# Patient Record
Sex: Female | Born: 1950
Health system: Southern US, Community
[De-identification: ages and names within clinical notes are randomized; demographics above are authoritative.]

## PROBLEM LIST (undated history)

## (undated) DIAGNOSIS — E785 Hyperlipidemia, unspecified: Secondary | ICD-10-CM

## (undated) DIAGNOSIS — M1990 Unspecified osteoarthritis, unspecified site: Secondary | ICD-10-CM

## (undated) DIAGNOSIS — H269 Unspecified cataract: Secondary | ICD-10-CM

## (undated) HISTORY — DX: Unspecified osteoarthritis, unspecified site: M19.90

## (undated) HISTORY — DX: Unspecified cataract: H26.9

---
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
## 2008-01-06 ENCOUNTER — Ambulatory Visit: Payer: Self-pay

## 2016-04-22 DIAGNOSIS — S92002A Unspecified fracture of left calcaneus, initial encounter for closed fracture: Secondary | ICD-10-CM | POA: Diagnosis not present

## 2016-04-22 DIAGNOSIS — S92152A Displaced avulsion fracture (chip fracture) of left talus, initial encounter for closed fracture: Secondary | ICD-10-CM | POA: Diagnosis not present

## 2016-04-22 DIAGNOSIS — S99912A Unspecified injury of left ankle, initial encounter: Secondary | ICD-10-CM | POA: Diagnosis not present

## 2016-04-22 DIAGNOSIS — W19XXXA Unspecified fall, initial encounter: Secondary | ICD-10-CM | POA: Diagnosis not present

## 2016-04-23 DIAGNOSIS — M79672 Pain in left foot: Secondary | ICD-10-CM | POA: Diagnosis not present

## 2016-04-23 DIAGNOSIS — S92025A Nondisplaced fracture of anterior process of left calcaneus, initial encounter for closed fracture: Secondary | ICD-10-CM | POA: Diagnosis not present

## 2016-05-09 DIAGNOSIS — M79672 Pain in left foot: Secondary | ICD-10-CM | POA: Diagnosis not present

## 2016-05-09 DIAGNOSIS — S92022D Displaced fracture of anterior process of left calcaneus, subsequent encounter for fracture with routine healing: Secondary | ICD-10-CM | POA: Diagnosis not present

## 2016-06-04 DIAGNOSIS — S92022D Displaced fracture of anterior process of left calcaneus, subsequent encounter for fracture with routine healing: Secondary | ICD-10-CM | POA: Diagnosis not present

## 2016-07-04 DIAGNOSIS — S92022D Displaced fracture of anterior process of left calcaneus, subsequent encounter for fracture with routine healing: Secondary | ICD-10-CM | POA: Diagnosis not present

## 2016-08-14 DIAGNOSIS — M84375A Stress fracture, left foot, initial encounter for fracture: Secondary | ICD-10-CM | POA: Diagnosis not present

## 2016-08-14 DIAGNOSIS — S92022D Displaced fracture of anterior process of left calcaneus, subsequent encounter for fracture with routine healing: Secondary | ICD-10-CM | POA: Diagnosis not present

## 2016-09-05 DIAGNOSIS — M79672 Pain in left foot: Secondary | ICD-10-CM | POA: Diagnosis not present

## 2016-09-05 DIAGNOSIS — M659 Synovitis and tenosynovitis, unspecified: Secondary | ICD-10-CM | POA: Diagnosis not present

## 2017-05-05 DIAGNOSIS — Z6832 Body mass index (BMI) 32.0-32.9, adult: Secondary | ICD-10-CM | POA: Diagnosis not present

## 2017-05-05 DIAGNOSIS — Z7722 Contact with and (suspected) exposure to environmental tobacco smoke (acute) (chronic): Secondary | ICD-10-CM | POA: Diagnosis not present

## 2017-05-05 DIAGNOSIS — Z833 Family history of diabetes mellitus: Secondary | ICD-10-CM | POA: Diagnosis not present

## 2017-05-05 DIAGNOSIS — E669 Obesity, unspecified: Secondary | ICD-10-CM | POA: Diagnosis not present

## 2017-05-05 DIAGNOSIS — R69 Illness, unspecified: Secondary | ICD-10-CM | POA: Diagnosis not present

## 2017-05-05 DIAGNOSIS — Z8249 Family history of ischemic heart disease and other diseases of the circulatory system: Secondary | ICD-10-CM | POA: Diagnosis not present

## 2017-05-23 ENCOUNTER — Ambulatory Visit (INDEPENDENT_AMBULATORY_CARE_PROVIDER_SITE_OTHER): Payer: Medicare HMO | Admitting: Nurse Practitioner

## 2017-05-23 ENCOUNTER — Other Ambulatory Visit: Payer: Self-pay

## 2017-05-23 ENCOUNTER — Encounter: Payer: Self-pay | Admitting: Nurse Practitioner

## 2017-05-23 VITALS — BP 144/81 | HR 77 | Temp 97.5°F | Ht 61.5 in | Wt 181.0 lb

## 2017-05-23 DIAGNOSIS — G8929 Other chronic pain: Secondary | ICD-10-CM | POA: Diagnosis not present

## 2017-05-23 DIAGNOSIS — M25512 Pain in left shoulder: Secondary | ICD-10-CM | POA: Diagnosis not present

## 2017-05-23 DIAGNOSIS — Z7689 Persons encountering health services in other specified circumstances: Secondary | ICD-10-CM

## 2017-05-23 DIAGNOSIS — H269 Unspecified cataract: Secondary | ICD-10-CM | POA: Insufficient documentation

## 2017-05-23 NOTE — Patient Instructions (Addendum)
Abigail Delacruz,   Thank you for coming in to clinic today.  You have left arm strain, possibly of your rotator cuff.  - May take Aleeve or naproxen sodium 220 mg every 12 hours for 14 days, then as needed. May alternate Aleeve and Tylenol in same day. - Start taking Tylenol extra strength 1 to 2 tablets every 6-8 hours for aches or fever/chills for next few days as needed.  Do not take more than 3,000 mg in 24 hours from all medicines.  - Use heat and ice.  Apply this for 15 minutes at a time 6-8 times per day.   - Muscle rub with lidocaine, lidocaine patch, Biofreeze, or tiger balm for topical pain relief.  Avoid using this with heat and ice to avoid burns.  START [name] for increasing range of motion.  Ophthalmologist for cataract surgery: [facility] [address], [place], [place] [address] Phone: [phone] [email or web address]   Please schedule a follow-up appointment with [name], AGNP. Return in about 3 months (around 08/23/2017) for annual physical.  AND let me know if you have need for followup  If you have any other questions or concerns, please feel free to call the clinic or send a message through [place]. You may also schedule an earlier appointment if necessary.  You will receive a survey after today's visit either digitally by e-mail or paper by [employer]. Your experiences and feedback matter to [place].  Please respond so we know how we are doing as we provide care for you.   [name], DNP, AGNP-BC Adult Gerontology Nurse Practitioner [place]

## 2017-05-23 NOTE — Progress Notes (Signed)
Subjective:    Patient ID: Abigail Delacruz, female    DOB: 12-Aug-1950, 67 y.o.   MRN: [id number]  Abigail Delacruz is a 67 y.o. female presenting on 05/23/2017 for Establish Care (intermittent Left arm pain,x 12 mths.Pt describe the pain as a burning pain w/ intermittent popping sounds.  possible associated to a fall x 1 yr ago )   HPI Establish Care New Provider Pt last seen by PCP many years ago ([place] at least 10 years ago).  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  She is originally from [place].  Left arm pain End of February fell and fractured left foot.  Had crutches for 7-8 weeks.  Foot continues to heal and improve, but occasionally has pain.  No known injury of shoulder during that time.  First noticed her shoulder was hurting about 5 months ago, but is getting worse.  Is "making noises in her shoulder."  Is difficult to put on her bra and lift her arm. - No OTC meds for pain.  Past Medical History:  Diagnosis Date  . Cataract    Past Surgical History:  Procedure Laterality Date  . APPENDECTOMY       Social History   Socioeconomic History  . Marital status: Married    Spouse name: Not on file  . Number of children: 2  . Years of education: Not on file  . Highest education level: Some college, no degree  Occupational History  . Not on file  Social Needs  . Financial resource strain: Not on file  . Food insecurity:    Worry: Not on file    Inability: Not on file  . Transportation needs:    Medical: Not on file    Non-medical: Not on file  Tobacco Use  . Smoking status: Former Research scientist (life sciences)  . Smokeless tobacco: Never Used  Substance and Sexual Activity  . Alcohol use: Not Currently    Frequency: Never    Comment: social 1x/yr  . Drug use: Never  . Sexual activity: Not Currently  Lifestyle  . Physical activity:    Days per week: Not on file    Minutes per session: Not on file    . Stress: Not on file  Relationships  . Social connections:    Talks on phone: Not on file    Gets together: Not on file    Attends religious service: Not on file    Active member of club or organization: Not on file    Attends meetings of clubs or organizations: Not on file    Relationship status: Not on file  . Intimate partner violence:    Fear of current or ex partner: Not on file    Emotionally abused: Not on file    Physically abused: Not on file    Forced sexual activity: Not on file  Other Topics Concern  . Not on file  Social History Narrative  . Not on file   Family History  Problem Relation Age of Onset  . Kidney failure Mother   . Cirrhosis Father   . Alcohol abuse Father   . Diabetes Brother   . Hypertension Brother   . Arthritis Brother    No current outpatient medications on file prior to visit.   No current facility-administered medications on file prior to visit.     Review of Systems  Constitutional: Negative.   HENT: Negative.  Eyes: Positive for visual disturbance (cataract).  Respiratory: Negative.   Cardiovascular: Negative.   Gastrointestinal: Negative.   Endocrine: Negative.   Genitourinary: Negative.   Musculoskeletal: Positive for arthralgias and myalgias.  Skin: Negative.   Allergic/Immunologic: Negative.   Neurological: Negative.   Hematological: Negative.   Psychiatric/Behavioral: Negative.    Per HPI unless specifically indicated above      Objective:    BP (!) 144/81 (BP Location: Left Arm, Patient Position: Sitting, Cuff Size: Normal)   Pulse 77   Temp (!) 97.5 F (36.4 C) (Oral)   Ht 5' 1.5" (1.562 m)   Wt 181 lb (82.1 kg)   BMI 33.65 kg/m   Wt Readings from Last 3 Encounters:  05/23/17 181 lb (82.1 kg)    Physical Exam  Constitutional: She is oriented to person, place, and time. She appears well-developed and well-nourished. No distress.  HENT:  Head: Normocephalic and atraumatic.  Neck: Normal range of motion.  Neck supple.  Cardiovascular: Normal rate, regular rhythm, S1 normal, S2 normal, normal heart sounds and intact distal pulses.  Pulmonary/Chest: Effort normal and breath sounds normal. No respiratory distress.  Musculoskeletal: She exhibits no edema (pedal).  Left Shoulder Inspection: Normal appearance bilateral symmetrical Palpation: Non-tender to palpation over anterior, lateral, or posterior shoulder  ROM: Reduced AROM and PROM forward flexion, abduction, internal / external rotation, symmetrical with pain in deltoid with full limits of ROM Special Testing: Rotator cuff testing positive for weakness with supraspinatus empty can test, O'brien's negative for labral pain, Hawkin's AC impingement negative for pain Strength: Reduced strength 4/5 flex/ext, ext rot / int rot, grip, rotator cuff str testing. Neurovascular: Distally intact pulses, sensation to light touch   Neurological: She is alert and oriented to person, place, and time.  Skin: Skin is warm and dry.  Psychiatric: She has a normal mood and affect. Her behavior is normal.  Vitals reviewed.     Assessment & Plan:   Problem List Items Addressed This Visit      Other   Chronic left shoulder pain - Primary Consistent with chronic rotator cuff tendonopathy vs impingement syndrome with some reduced active ROM and with evidence of possible muscle tear given weakness.  No known repetitive overhead/strenuous activity as likely etiology and no clear etiology of injury.  67 year old patient with likely underlying arthritis -No prior imaging / No imaging on chart  Plan: 1. Start rx Naproxen 220 mg twice daily (with food) for 2 weeks, then as needed 2. May take Tylenol Ex Str 1-2 q 6 hr PRN 3. Relative rest but keep shoulder mobile, demonstrated ROM exercises, avoid heavy lifting 4. May try heating pad PRN 5.  Given chronicity of injury, will go ahead and proceed with orthopedic surgery referral. 6.  Referral to [place] physical  therapy provided for patient today.  Work to increase range of motion and control pain. 7. Follow-up 4-6 weeks if not improved for re-evaluation   Relevant Orders   Ambulatory referral to Orthopedic Surgery    Other Visit Diagnoses    Encounter to establish care     Previous PCP was at more than 10 years ago.  Records will not be requested.  Past medical, family, and surgical history reviewed w/ pt in clinic today.      Follow up plan: Return in about 3 months (around 08/23/2017) for annual physical.  [name], DNP, AGPCNP-BC Adult Gerontology Primary Care Nurse Practitioner [facility] 05/23/2017, 9:10 AM

## 2017-05-27 DIAGNOSIS — M7542 Impingement syndrome of left shoulder: Secondary | ICD-10-CM | POA: Diagnosis not present

## 2017-05-27 DIAGNOSIS — M7502 Adhesive capsulitis of left shoulder: Secondary | ICD-10-CM | POA: Diagnosis not present

## 2017-05-29 DIAGNOSIS — M25572 Pain in left ankle and joints of left foot: Secondary | ICD-10-CM | POA: Diagnosis not present

## 2017-05-29 DIAGNOSIS — M7542 Impingement syndrome of left shoulder: Secondary | ICD-10-CM | POA: Diagnosis not present

## 2017-05-29 DIAGNOSIS — M7502 Adhesive capsulitis of left shoulder: Secondary | ICD-10-CM | POA: Diagnosis not present

## 2017-06-03 DIAGNOSIS — M7502 Adhesive capsulitis of left shoulder: Secondary | ICD-10-CM | POA: Diagnosis not present

## 2017-06-03 DIAGNOSIS — M7542 Impingement syndrome of left shoulder: Secondary | ICD-10-CM | POA: Diagnosis not present

## 2017-06-05 DIAGNOSIS — M7502 Adhesive capsulitis of left shoulder: Secondary | ICD-10-CM | POA: Diagnosis not present

## 2017-06-05 DIAGNOSIS — M7542 Impingement syndrome of left shoulder: Secondary | ICD-10-CM | POA: Diagnosis not present

## 2017-06-10 DIAGNOSIS — M7502 Adhesive capsulitis of left shoulder: Secondary | ICD-10-CM | POA: Diagnosis not present

## 2017-06-10 DIAGNOSIS — M7542 Impingement syndrome of left shoulder: Secondary | ICD-10-CM | POA: Diagnosis not present

## 2017-06-12 DIAGNOSIS — M7542 Impingement syndrome of left shoulder: Secondary | ICD-10-CM | POA: Diagnosis not present

## 2017-06-12 DIAGNOSIS — M7502 Adhesive capsulitis of left shoulder: Secondary | ICD-10-CM | POA: Diagnosis not present

## 2017-06-17 DIAGNOSIS — M7542 Impingement syndrome of left shoulder: Secondary | ICD-10-CM | POA: Diagnosis not present

## 2017-06-17 DIAGNOSIS — M7502 Adhesive capsulitis of left shoulder: Secondary | ICD-10-CM | POA: Diagnosis not present

## 2017-06-19 DIAGNOSIS — M7502 Adhesive capsulitis of left shoulder: Secondary | ICD-10-CM | POA: Diagnosis not present

## 2017-06-19 DIAGNOSIS — M7542 Impingement syndrome of left shoulder: Secondary | ICD-10-CM | POA: Diagnosis not present

## 2017-06-24 DIAGNOSIS — M7542 Impingement syndrome of left shoulder: Secondary | ICD-10-CM | POA: Diagnosis not present

## 2017-06-24 DIAGNOSIS — M7502 Adhesive capsulitis of left shoulder: Secondary | ICD-10-CM | POA: Diagnosis not present

## 2017-06-26 DIAGNOSIS — M7502 Adhesive capsulitis of left shoulder: Secondary | ICD-10-CM | POA: Diagnosis not present

## 2017-06-26 DIAGNOSIS — M7542 Impingement syndrome of left shoulder: Secondary | ICD-10-CM | POA: Diagnosis not present

## 2017-06-30 DIAGNOSIS — M7542 Impingement syndrome of left shoulder: Secondary | ICD-10-CM | POA: Diagnosis not present

## 2017-06-30 DIAGNOSIS — M7502 Adhesive capsulitis of left shoulder: Secondary | ICD-10-CM | POA: Diagnosis not present

## 2017-07-01 DIAGNOSIS — M7542 Impingement syndrome of left shoulder: Secondary | ICD-10-CM | POA: Diagnosis not present

## 2017-07-01 DIAGNOSIS — M7502 Adhesive capsulitis of left shoulder: Secondary | ICD-10-CM | POA: Diagnosis not present

## 2017-07-11 DIAGNOSIS — M7502 Adhesive capsulitis of left shoulder: Secondary | ICD-10-CM | POA: Diagnosis not present

## 2017-07-11 DIAGNOSIS — M7542 Impingement syndrome of left shoulder: Secondary | ICD-10-CM | POA: Diagnosis not present

## 2017-08-01 ENCOUNTER — Telehealth: Payer: Self-pay | Admitting: Nurse Practitioner

## 2017-08-01 NOTE — Telephone Encounter (Signed)
Called to schedule MWV w [facility] prior to physical scheduled for 08-26-17.  Could change labs to a Tuesday and do AWV at that time.

## 2017-08-12 DIAGNOSIS — H2513 Age-related nuclear cataract, bilateral: Secondary | ICD-10-CM | POA: Diagnosis not present

## 2017-08-20 ENCOUNTER — Other Ambulatory Visit: Payer: Self-pay

## 2017-08-21 ENCOUNTER — Other Ambulatory Visit: Payer: Medicare HMO

## 2017-08-21 ENCOUNTER — Other Ambulatory Visit: Payer: Self-pay | Admitting: Nurse Practitioner

## 2017-08-21 DIAGNOSIS — R7989 Other specified abnormal findings of blood chemistry: Secondary | ICD-10-CM | POA: Diagnosis not present

## 2017-08-21 DIAGNOSIS — Z13228 Encounter for screening for other metabolic disorders: Secondary | ICD-10-CM | POA: Diagnosis not present

## 2017-08-21 DIAGNOSIS — Z130 Encounter for screening for diseases of the blood and blood-forming organs and certain disorders involving the immune mechanism: Secondary | ICD-10-CM

## 2017-08-21 DIAGNOSIS — Z1329 Encounter for screening for other suspected endocrine disorder: Secondary | ICD-10-CM

## 2017-08-21 DIAGNOSIS — E782 Mixed hyperlipidemia: Secondary | ICD-10-CM | POA: Diagnosis not present

## 2017-08-22 LAB — CBC WITH DIFFERENTIAL/PLATELET
Basophils Absolute: 51 cells/uL (ref 0–200)
Basophils Relative: 0.9 %
Eosinophils Absolute: 143 cells/uL (ref 15–500)
Eosinophils Relative: 2.5 %
HCT: 42.5 % (ref 35.0–45.0)
Hemoglobin: 14 g/dL (ref 11.7–15.5)
Lymphs Abs: 1505 cells/uL (ref 850–3900)
MCH: 30.7 pg (ref 27.0–33.0)
MCHC: 32.9 g/dL (ref 32.0–36.0)
MCV: 93.2 fL (ref 80.0–100.0)
MPV: 11.4 fL (ref 7.5–12.5)
Monocytes Relative: 7.9 %
Neutro Abs: 3551 cells/uL (ref 1500–7800)
Neutrophils Relative %: 62.3 %
Platelets: 245 10*3/uL (ref 140–400)
RBC: 4.56 10*6/uL (ref 3.80–5.10)
RDW: 13.1 % (ref 11.0–15.0)
Total Lymphocyte: 26.4 %
WBC mixed population: 450 cells/uL (ref 200–950)
WBC: 5.7 10*3/uL (ref 3.8–10.8)

## 2017-08-22 LAB — COMPLETE METABOLIC PANEL WITH GFR
AG Ratio: 1.5 (calc) (ref 1.0–2.5)
ALT: 29 U/L (ref 6–29)
AST: 23 U/L (ref 10–35)
Albumin: 4 g/dL (ref 3.6–5.1)
Alkaline phosphatase (APISO): 140 U/L — ABNORMAL HIGH (ref 33–130)
BUN: 21 mg/dL (ref 7–25)
CO2: 25 mmol/L (ref 20–32)
Calcium: 9 mg/dL (ref 8.6–10.4)
Chloride: 103 mmol/L (ref 98–110)
Creat: 0.74 mg/dL (ref 0.50–0.99)
GFR, Est African American: 97 mL/min/{1.73_m2} (ref >=60)
GFR, Est Non African American: 84 mL/min/{1.73_m2} (ref >=60)
Globulin: 2.7 g/dL (calc) (ref 1.9–3.7)
Glucose, Bld: 99 mg/dL (ref 65–99)
Potassium: 4.3 mmol/L (ref 3.5–5.3)
Sodium: 138 mmol/L (ref 135–146)
Total Bilirubin: 0.5 mg/dL (ref 0.2–1.2)
Total Protein: 6.7 g/dL (ref 6.1–8.1)

## 2017-08-22 LAB — LIPID PANEL
Cholesterol: 258 mg/dL — ABNORMAL HIGH (ref <200)
HDL: 43 mg/dL — ABNORMAL LOW (ref >50)
LDL Cholesterol (Calc): 172 mg/dL (calc) — ABNORMAL HIGH
Non-HDL Cholesterol (Calc): 215 mg/dL (calc) — ABNORMAL HIGH (ref <130)
Total CHOL/HDL Ratio: 6 (calc) — ABNORMAL HIGH (ref <5.0)
Triglycerides: 265 mg/dL — ABNORMAL HIGH (ref <150)

## 2017-08-22 LAB — HEMOGLOBIN A1C
Hgb A1c MFr Bld: 5.6 % of total Hgb (ref <5.7)
Mean Plasma Glucose: 114 (calc)
eAG (mmol/L): 6.3 (calc)

## 2017-08-22 LAB — TSH: TSH: 1.93 mIU/L (ref 0.40–4.50)

## 2017-08-26 ENCOUNTER — Ambulatory Visit (INDEPENDENT_AMBULATORY_CARE_PROVIDER_SITE_OTHER): Payer: Medicare HMO | Admitting: Nurse Practitioner

## 2017-08-26 ENCOUNTER — Other Ambulatory Visit: Payer: Self-pay

## 2017-08-26 ENCOUNTER — Encounter: Payer: Self-pay | Admitting: Nurse Practitioner

## 2017-08-26 VITALS — BP 136/60 | HR 81 | Resp 16 | Ht 61.5 in | Wt 179.2 lb

## 2017-08-26 DIAGNOSIS — Z780 Asymptomatic menopausal state: Secondary | ICD-10-CM | POA: Diagnosis not present

## 2017-08-26 DIAGNOSIS — Z0001 Encounter for general adult medical examination with abnormal findings: Secondary | ICD-10-CM | POA: Diagnosis not present

## 2017-08-26 DIAGNOSIS — E782 Mixed hyperlipidemia: Secondary | ICD-10-CM | POA: Diagnosis not present

## 2017-08-26 DIAGNOSIS — Z23 Encounter for immunization: Secondary | ICD-10-CM | POA: Diagnosis not present

## 2017-08-26 DIAGNOSIS — D2261 Melanocytic nevi of right upper limb, including shoulder: Secondary | ICD-10-CM

## 2017-08-26 DIAGNOSIS — Z1211 Encounter for screening for malignant neoplasm of colon: Secondary | ICD-10-CM | POA: Diagnosis not present

## 2017-08-26 DIAGNOSIS — Z1239 Encounter for other screening for malignant neoplasm of breast: Secondary | ICD-10-CM

## 2017-08-26 DIAGNOSIS — Z1231 Encounter for screening mammogram for malignant neoplasm of breast: Secondary | ICD-10-CM

## 2017-08-26 MED ORDER — ATORVASTATIN CALCIUM 20 MG PO TABS: 20.0000 mg | ORAL_TABLET | Freq: Every day | ORAL | 3 refills | Status: DC

## 2017-08-26 NOTE — Progress Notes (Signed)
Subjective:    Patient ID: Abigail Delacruz, female    DOB: 10-06-1950, 67 y.o.   MRN: [id number]  Abigail Delacruz is a 67 y.o. female presenting on 08/26/2017 for Annual Exam   HPI Annual Physical Exam Patient has been feeling well.  Shoulder pain and ROM are improving.  They have no acute concerns today. Sleeps 7-8 hours per night uninterrupted. Cataract surgery 09/29/2017 R eye and 11/03/2017 (L eye) ([facility])  HEALTH MAINTENANCE: Weight/BMI: Obesity class 1 Physical activity: regular walking with travel, no "fun" place to walk Diet: Regular Seatbelt: always Sunscreen: regularly Mammogram: due DEXA: due Colon Cancer Screen: due HIV/Hep C: hiv neg, hep c defer to next year Optometry: regular - cataracts Dentistry: no regular care, requests cleaning  VACCINES: Tetanus: due Pneumonia: Prevnar 13 today   Social History   Tobacco Use  . Smoking status: Former Research scientist (life sciences)  . Smokeless tobacco: Never Used  Substance Use Topics  . Alcohol use: Not Currently    Frequency: Never    Comment: social 1x/yr  . Drug use: Never    Review of Systems Per HPI unless specifically indicated above     Objective:    BP 136/60   Pulse 81   Resp 16   Ht 5' 1.5" (1.562 m)   Wt 179 lb 3.2 oz (81.3 kg)   SpO2 98%   BMI 33.31 kg/m   Wt Readings from Last 3 Encounters:  08/26/17 179 lb 3.2 oz (81.3 kg)  05/23/17 181 lb (82.1 kg)    Physical Exam  Constitutional: She is oriented to person, place, and time. She appears well-developed and well-nourished. No distress.  HENT:  Head: Normocephalic and atraumatic.  Right Ear: External ear normal.  Left Ear: External ear normal.  Nose: Nose normal.  Mouth/Throat: Oropharynx is clear and moist.  Eyes: Pupils are equal, round, and reactive to light. Conjunctivae are normal.  Neck: Normal range of motion. Neck supple. No JVD present. No tracheal deviation present. No thyromegaly present.  Cardiovascular: Normal rate, regular  rhythm, normal heart sounds and intact distal pulses. Exam reveals no gallop and no friction rub.  No murmur heard. Pulmonary/Chest: Effort normal and breath sounds normal. No respiratory distress.  Abdominal: Soft. Bowel sounds are normal. She exhibits no distension. There is no hepatosplenomegaly. There is no tenderness.  Musculoskeletal: Normal range of motion.  Full Left shoulder ROM without pain  Lymphadenopathy:    She has no cervical adenopathy.  Neurological: She is alert and oriented to person, place, and time. No cranial nerve deficit.  Skin: Skin is warm and dry. Capillary refill takes less than 2 seconds.  Psychiatric: She has a normal mood and affect. Her behavior is normal. Judgment and thought content normal.  Nursing note and vitals reviewed.    Results for orders placed or performed in visit on 08/21/17  TSH  Result Value Ref Range   TSH 1.93 0.40 - 4.50 mIU/L  Lipid panel  Result Value Ref Range   Cholesterol 258 (H) <200 mg/dL   HDL 43 (L) >50 mg/dL   Triglycerides 265 (H) <150 mg/dL   LDL Cholesterol (Calc) 172 (H) mg/dL (calc)   Total CHOL/HDL Ratio 6.0 (H) <5.0 (calc)   Non-HDL Cholesterol (Calc) 215 (H) <130 mg/dL (calc)  Hemoglobin A1c  Result Value Ref Range   Hgb A1c MFr Bld 5.6 <5.7 % of total Hgb   Mean Plasma Glucose 114 (calc)   eAG (mmol/L) 6.3 (calc)  COMPLETE METABOLIC  PANEL WITH GFR  Result Value Ref Range   Glucose, Bld 99 65 - 99 mg/dL   BUN 21 7 - 25 mg/dL   Creat 0.74 0.50 - 0.99 mg/dL   GFR, Est Non African American 84 > OR = 60 mL/min/1.63m   GFR, Est African American 97 > OR = 60 mL/min/1.758m  BUN/Creatinine Ratio NOT APPLICABLE 6 - 22 (calc)   Sodium 138 135 - 146 mmol/L   Potassium 4.3 3.5 - 5.3 mmol/L   Chloride 103 98 - 110 mmol/L   CO2 25 20 - 32 mmol/L   Calcium 9.0 8.6 - 10.4 mg/dL   Total Protein 6.7 6.1 - 8.1 g/dL   Albumin 4.0 3.6 - 5.1 g/dL   Globulin 2.7 1.9 - 3.7 g/dL (calc)   AG Ratio 1.5 1.0 - 2.5 (calc)    Total Bilirubin 0.5 0.2 - 1.2 mg/dL   Alkaline phosphatase (APISO) 140 (H) 33 - 130 U/L   AST 23 10 - 35 U/L   ALT 29 6 - 29 U/L  CBC with Differential/Platelet  Result Value Ref Range   WBC 5.7 3.8 - 10.8 Thousand/uL   RBC 4.56 3.80 - 5.10 Million/uL   Hemoglobin 14.0 11.7 - 15.5 g/dL   HCT 42.5 35.0 - 45.0 %   MCV 93.2 80.0 - 100.0 fL   MCH 30.7 27.0 - 33.0 pg   MCHC 32.9 32.0 - 36.0 g/dL   RDW 13.1 11.0 - 15.0 %   Platelets 245 140 - 400 Thousand/uL   MPV 11.4 7.5 - 12.5 fL   Neutro Abs 3,551 1,500 - 7,800 cells/uL   Lymphs Abs 1,505 850 - 3,900 cells/uL   WBC mixed population 450 200 - 950 cells/uL   Eosinophils Absolute 143 15 - 500 cells/uL   Basophils Absolute 51 0 - 200 cells/uL   Neutrophils Relative % 62.3 %   Total Lymphocyte 26.4 %   Monocytes Relative 7.9 %   Eosinophils Relative 2.5 %   Basophils Relative 0.9 %      Assessment & Plan:   Problem List Items Addressed This Visit    None    Visit Diagnoses    Encounter for general adult medical examination with abnormal findings    -  Primary Physical exam with no new physical findings, but with abnormal lab findings with pre visit labs.  Well adult with no acute concerns.  Plan: 1. Obtain health maintenance screenings according to age. - Labs reviewed with patient in clinic - Increase physical activity to 30 minutes most days of the week.  - Eat healthy diet high in vegetables and fruits; low in refined carbohydrates. 2. Return 1 year for annual physical.    Mixed hyperlipidemia     New diagnosis hyperlipidemia with elevations of Total cholesterol, triglycerides, LDL and low HDL.  She is not currently participating in regular physical exercise or eating very healthy diet.  Patient also with obesity, worsening with age.  Plan: 1. Encouraged low glycemic diet 2. START atorvastatin 20 mg once daily 3. CMP, Lipid at followup 4. Follow-up 6 months approx.   Relevant Orders   COMPLETE METABOLIC PANEL WITH GFR    Lipid panel   Atypical nevus of forearm, right     Patient with "new" nevus on R posterior forearm with multiple colors and raised appearance.  No current change in skin integrity.  - Referral to dermatology for evaluation, full body skin check, skin cancer screen. - follow-up annually and prn  Relevant Orders   Ambulatory referral to Dermatology   Need for vaccination against Streptococcus pneumoniae using pneumococcal conjugate vaccine 13     Patient with need for PCV-13.  No prior pneumonia vaccines.  Administer today.  Vaccinate with PPSV23 in 1 year.   Relevant Orders   Pneumococcal conjugate vaccine 13-valent IM (Completed)   Colon cancer screening     Pt requiring colon cancer screening and no screening in past.  No family history of colon cancer.  Plan: - Discussed timing for initiation of colon cancer screening ACS vs USPSTF guidelines - Mutual decision making discussion for options of colonoscopy vs cologuard.  Pt prefers cologuard. - Ordered Cologuard today.  Kit demonstrated by Roselyn Reef, CMA.   Relevant Orders   Cologuard   Breast cancer screening     Pt last mammogram approx 3 years ago per pt.  Result neg.  Plan: 1. Screening mammogram order placed.  Pt will call to schedule appointment.  Information given for scheduling. 2. Follow-up 1 year    Relevant Orders   MM DIGITAL SCREENING BILATERAL   Asymptomatic postmenopausal estrogen deficiency     Pt postmenopausal w/o history of prior DEXA scan.    Plan: 1. Obtain DG bone density.  Patient to call and schedule.      Relevant Orders   DG Bone Density      Meds ordered this encounter  Medications  . atorvastatin (LIPITOR) 20 MG tablet    Sig: Take 1 tablet (20 mg total) by mouth daily.    Dispense:  90 tablet    Refill:  3    Order Specific Question:   Supervising Provider    Answer:   Olin Hauser [[id number]]    Follow up plan: Return in about 5 months (around 01/26/2018) for Cholesterol with fasting  labs prior.  [name], DNP, AGPCNP-BC Adult Gerontology Primary Care Nurse Practitioner [facility] 08/26/2017, 10:00 AM

## 2017-08-26 NOTE — Patient Instructions (Addendum)
Abigail Delacruz,   Thank you for coming in to clinic today.  1. Your provider recommends a dental exam and cleaning: [facility] Address: [address], [place], [place] [address]  Phone: [phone]  Ruthy Dick, DDS Address: [address] [place], [place] [address]  Phone: [phone]   2. Your mammogram and bone density orders have been placed.  Call the Scheduling phone number at [phone] to schedule your mammogram at your convenience.  You can choose to go to either location listed below.  Let the scheduler know which location you prefer.  [facility]  [place], [place] [address]   [facility] [address] [place], [place] [address]  3. Colon Cancer Screening: - For all adults age 88 and older, routine colon cancer screening is highly recommended. - Early detection of colon cancer is important, because often there are no warning signs or symptoms.  If colon cancer is found early, usually it can be cured. Advanced cancer is hard to treat.  - If you are not interested in Colonoscopy screening (if done and normal you could be cleared for 5 to 10 years until next due), then Cologuard is an excellent alternative for screening test for Colon Cancer. It is highly sensitive for detecting DNA of colon cancer from even the earliest stages. Also, there is NO bowel prep required. - If Cologuard is NEGATIVE, then it is good for 3 years before next due - If Cologuard is POSITIVE, then it is strongly advised to get a Colonoscopy, which allows the GI doctor to locate the source of the cancer or polyp (even very early stage) and treat it by removing it. ------------------------- If you would like to proceed with Cologuard (stool DNA test) - FIRST, call your insurance company and tell them you want to check cost of Cologuard tell them CPT Code [phone] (it may be  completely covered with a small or no cost, OR max cost without any coverage is about $600). If you do NOT open the kit, and decide not to do the test, you will NOT be charged, you should contact the company to return the kit if you decide not to do the test. - If you want to proceed, you can notify us (office phone, [place], or at next visit) and we will order it for you. The test kit will be delivered to your house in about 1 week. Follow instructions to collect your stool sample.  You may call the company for any help or questions, 24/7 telephone support at [phone].   - Increase your physical activity until you are increasing your heart rate for 30 minutes on most days of the week. - Eat a low glycemic diet (handout) to help improve your cholesterol.  Please schedule a follow-up appointment with [name], AGNP. Return in about 5 months (around 01/26/2018) for Cholesterol with fasting labs prior.  If you have any other questions or concerns, please feel free to call the clinic or send a message through [place]. You may also schedule an earlier appointment if necessary.  You will receive a survey after today's visit either digitally by e-mail or paper by [employer]. Your experiences and feedback matter to [place].  Please respond so we know how we are doing as we provide care for you.   [name], DNP, AGNP-BC Adult Gerontology Nurse Practitioner [place]  Serving Sizes A serving size is a measured amount of food or drink, such as one slice of bread, that has an associated nutrient content. Knowing the serving size of a food or drink can help you determine how much of that food you should consume. What is the size of one serving? The size of one healthy serving depends on the food or drink. To determine a serving size, read the food label. If the food or drink does not have a food label, try to find serving size information online. Or, use the  following to estimate the size of one adult serving: Grain 1 slice bread.  bagel.  cup pasta. Vegetable  cup cooked or canned vegetables. 1 cup raw, leafy greens. Fruit  cup canned fruit. 1 medium fruit.  cup dried fruit. Meat and Other Protein Sources 1 oz meat, poultry, or fish.  cup cooked beans. 1 egg.  cup nuts or seeds. 1 Tbsp nut butter.  cup tofu or tempeh. 2 Tbsp hummus. Dairy An individual container of yogurt (6-8 oz). 1 piece of cheese the size of your thumb (1 oz). 1 cup (8 oz) milk or milk alternative. Fat A piece the size of one dice. 1 tsp soft margarine. 1 Tbsp mayonnaise. 1 tsp vegetable oil. 1 Tbsp regular salad dressing. 2 Tbsp low-fat salad dressing. How many servings should I eat from each food group each day? The following are the suggested number of servings to try and have every day from each food group. You can also look at your eating throughout the week and aim for meeting these requirements on most days for overall healthy eating. Grain 6-8 servings. Try to have half of your grains from whole grains, such as whole wheat bread, corn tortillas, oatmeal, brown rice, whole wheat pasta, and bulgur. Vegetable At least 2-3 servings. Fruit 2 servings. Meat and Other Protein Foods 5-6 servings. Aim to have lean proteins, such as chicken, [place], fish, beans, or tofu. Dairy 3 servings. Choose low-fat or nonfat if you are trying to control your weight. Fat 2-3 servings. Is a serving the same thing as a portion? No. A portion is the actual amount you eat, which may be more than one serving. Knowing the specific serving size of a food and the nutritional information that goes with it can help you make a healthy decision on what size portion to eat. What are some tips to help me learn healthy serving sizes?  Check food labels for serving sizes. Many foods that come as a single portion actually contain multiple servings.  Determine the serving size of foods you  commonly eat and figure out how large a portion you usually eat.  Measure the number of servings that can be held by the bowls, glasses, cups, and plates you typically use. For example, pour your breakfast cereal into your regular bowl and then pour it into a measuring cup.  For 1-2 days, measure the serving sizes of all the foods you eat.  Practice estimating serving sizes and determining how big your portions should be. This information is not intended to replace advice given to you by your health care provider. Make sure you discuss any questions you have with your health care provider. Document Released: 11/10/2002 Document Revised: 10/07/2015 Document Reviewed: 05/11/2013 Elsevier Interactive Patient Education  [employer].

## 2017-09-01 ENCOUNTER — Other Ambulatory Visit: Payer: Self-pay

## 2017-09-01 ENCOUNTER — Encounter: Payer: Self-pay | Admitting: *Deleted

## 2017-09-02 NOTE — Discharge Instructions (Signed)

## 2017-09-08 ENCOUNTER — Ambulatory Visit: Payer: Medicare HMO | Admitting: Anesthesiology

## 2017-09-08 ENCOUNTER — Encounter
Admission: RE | Disposition: A | Discharge status: Home / Self Care | Payer: Self-pay | Source: Ambulatory Visit | Attending: Ophthalmology

## 2017-09-08 ENCOUNTER — Ambulatory Visit
Admission: RE | Admit: 2017-09-08 | Discharge: 2017-09-08 | Disposition: A | Discharge status: Home / Self Care | Payer: Medicare HMO | Source: Ambulatory Visit | Attending: Ophthalmology | Admitting: Ophthalmology

## 2017-09-08 DIAGNOSIS — D2261 Melanocytic nevi of right upper limb, including shoulder: Secondary | ICD-10-CM | POA: Insufficient documentation

## 2017-09-08 DIAGNOSIS — H2511 Age-related nuclear cataract, right eye: Secondary | ICD-10-CM | POA: Diagnosis not present

## 2017-09-08 DIAGNOSIS — E782 Mixed hyperlipidemia: Secondary | ICD-10-CM | POA: Insufficient documentation

## 2017-09-08 DIAGNOSIS — Z87891 Personal history of nicotine dependence: Secondary | ICD-10-CM | POA: Diagnosis not present

## 2017-09-08 DIAGNOSIS — H25811 Combined forms of age-related cataract, right eye: Secondary | ICD-10-CM | POA: Diagnosis not present

## 2017-09-08 HISTORY — PX: CATARACT EXTRACTION W/PHACO: SHX586

## 2017-09-08 HISTORY — DX: Hyperlipidemia, unspecified: E78.5

## 2017-09-08 SURGERY — PHACOEMULSIFICATION, CATARACT, WITH IOL INSERTION
Anesthesia: Monitor Anesthesia Care | Site: Eye | Laterality: Right | Wound class: Clean

## 2017-09-08 MED ORDER — ACETAMINOPHEN 325 MG PO TABS: 325.0000 mg | ORAL_TABLET | Freq: Once | ORAL | Status: DC

## 2017-09-08 MED ORDER — TETRACAINE HCL 0.5 % OP SOLN
1.0000 [drp] | OPHTHALMIC | Status: DC | PRN
  Administered 2017-09-08 (×2): 1 [drp] via OPHTHALMIC

## 2017-09-08 MED ORDER — MOXIFLOXACIN HCL 0.5 % OP SOLN
OPHTHALMIC | Status: DC | PRN
  Administered 2017-09-08: 0.2 mL via OPHTHALMIC

## 2017-09-08 MED ORDER — FENTANYL CITRATE (PF) 100 MCG/2ML IJ SOLN
INTRAMUSCULAR | Status: DC | PRN
  Administered 2017-09-08: 50 ug via INTRAVENOUS

## 2017-09-08 MED ORDER — SODIUM HYALURONATE 10 MG/ML IO SOLN
INTRAOCULAR | Status: DC | PRN
  Administered 2017-09-08: 0.55 mL via INTRAOCULAR

## 2017-09-08 MED ORDER — PHENYLEPHRINE HCL 10 % OP SOLN
1.0000 [drp] | OPHTHALMIC | Status: DC | PRN
  Administered 2017-09-08 (×4): 1 [drp] via OPHTHALMIC

## 2017-09-08 MED ORDER — LIDOCAINE HCL (PF) 2 % IJ SOLN
INTRAOCULAR | Status: DC | PRN
  Administered 2017-09-08: 1 mL via INTRAOCULAR

## 2017-09-08 MED ORDER — MIDAZOLAM HCL 2 MG/2ML IJ SOLN
INTRAMUSCULAR | Status: DC | PRN
  Administered 2017-09-08 (×2): 1 mg via INTRAVENOUS

## 2017-09-08 MED ORDER — SODIUM HYALURONATE 23 MG/ML IO SOLN
INTRAOCULAR | Status: DC | PRN
  Administered 2017-09-08: 0.6 mL via INTRAOCULAR

## 2017-09-08 MED ORDER — EPINEPHRINE PF 1 MG/ML IJ SOLN
INTRAOCULAR | Status: DC | PRN
  Administered 2017-09-08: 79 mL via OPHTHALMIC

## 2017-09-08 MED ORDER — CYCLOPENTOLATE HCL 2 % OP SOLN
1.0000 [drp] | OPHTHALMIC | Status: DC | PRN
  Administered 2017-09-08 (×4): 1 [drp] via OPHTHALMIC

## 2017-09-08 MED ORDER — LACTATED RINGERS IV SOLN: INTRAVENOUS | Status: DC

## 2017-09-08 MED ORDER — ACETAMINOPHEN 160 MG/5ML PO SOLN: 325.0000 mg | Freq: Once | ORAL | Status: DC

## 2017-09-08 SURGICAL SUPPLY — 16 items

## 2017-09-08 NOTE — Anesthesia Preprocedure Evaluation (Addendum)
Anesthesia Evaluation  Patient identified by MRN, date of birth, ID band Patient awake    Reviewed: Allergy & Precautions, H&P , NPO status , Patient's Chart, lab work & pertinent test results  Airway Mallampati: II  TM Distance: >3 FB Neck ROM: full    Dental no notable dental hx.    Pulmonary former smoker,    Pulmonary exam normal breath sounds clear to auscultation       Cardiovascular Normal cardiovascular exam Rhythm:regular Rate:Normal     Neuro/Psych    GI/Hepatic   Endo/Other    Renal/GU      Musculoskeletal   Abdominal   Peds  Hematology   Anesthesia Other Findings   Reproductive/Obstetrics                             Anesthesia Physical Anesthesia Plan  ASA: II  Anesthesia Plan: MAC   Post-op Pain Management:    Induction:   PONV Risk Score and Plan: 2 and Treatment may vary due to age or medical condition and Midazolam  Airway Management Planned:   Additional Equipment:   Intra-op Plan:   Post-operative Plan:   Informed Consent: I have reviewed the patients History and Physical, chart, labs and discussed the procedure including the risks, benefits and alternatives for the proposed anesthesia with the patient or authorized representative who has indicated his/her understanding and acceptance.     Plan Discussed with: CRNA  Anesthesia Plan Comments:        Anesthesia Quick Evaluation

## 2017-09-08 NOTE — Op Note (Signed)
OPERATIVE NOTE  Abigail Delacruz [id number] 09/08/2017   PREOPERATIVE DIAGNOSIS:  Nuclear sclerotic cataract right eye.  H25.11   POSTOPERATIVE DIAGNOSIS:    Nuclear sclerotic cataract right eye.     PROCEDURE:  Phacoemusification with posterior chamber intraocular lens placement of the right eye   LENS:   Implant Name Type Inv. Item Serial No. Manufacturer Lot No. LRB No. Used  LENS IOL DIOP 18.0 - [id number] Intraocular Lens LENS IOL DIOP 18.0 [id number] AMO  Right 1       PCB00 +18.0   ULTRASOUND TIME: 0 minutes 47 seconds.  CDE 8.57   SURGEON:  Benay Pillow, MD, MPH  ANESTHESIOLOGIST: Anesthesiologist: Ronelle Nigh, MD CRNA: Lind Guest, CRNA   ANESTHESIA:  Topical with tetracaine drops augmented with 1% preservative-free intracameral lidocaine.  ESTIMATED BLOOD LOSS: less than 1 mL.   COMPLICATIONS:  None.   DESCRIPTION OF PROCEDURE:  The patient was identified in the holding room and transported to the operating room and placed in the supine position under the operating microscope.  The right eye was identified as the operative eye and it was prepped and draped in the usual sterile ophthalmic fashion.   A 1.0 millimeter clear-corneal paracentesis was made at the 10:30 position. 0.5 ml of preservative-free 1% lidocaine with epinephrine was injected into the anterior chamber.  The anterior chamber was filled with Healon 5 viscoelastic.  A 2.4 millimeter keratome was used to make a near-clear corneal incision at the 8:00 position.  A curvilinear capsulorrhexis was made with a cystotome and capsulorrhexis forceps.  Balanced salt solution was used to hydrodissect and hydrodelineate the nucleus.   Phacoemulsification was then used in stop and chop fashion to remove the lens nucleus and epinucleus.  The remaining cortex was then removed using the irrigation and aspiration handpiece. Healon was then placed into the capsular bag to distend it for lens placement.  A lens was  then injected into the capsular bag.  The remaining viscoelastic was aspirated.   Wounds were hydrated with balanced salt solution.  The anterior chamber was inflated to a physiologic pressure with balanced salt solution.   Intracameral vigamox 0.1 mL undiluted was injected into the eye and a drop placed onto the ocular surface.  No wound leaks were noted.  The patient was taken to the recovery room in stable condition without complications of anesthesia or surgery  Benay Pillow 09/08/2017, 12:21 PM

## 2017-09-08 NOTE — Anesthesia Procedure Notes (Signed)
Procedure Name: MAC Date/Time: 09/08/2017 12:05 PM Performed by: Lind Guest, CRNA Pre-anesthesia Checklist: Patient identified, Emergency Drugs available, Suction available, Patient being monitored and Timeout performed Patient Re-evaluated:Patient Re-evaluated prior to induction Oxygen Delivery Method: Nasal cannula

## 2017-09-08 NOTE — H&P (Signed)
The History and Physical notes are on paper, have been signed, and are to be scanned.   I have examined the patient and there are no changes to the H&P.   Benay Pillow 09/08/2017 11:44 AM

## 2017-09-08 NOTE — Anesthesia Postprocedure Evaluation (Signed)
Anesthesia Post Note  Patient: Abigail Delacruz  Procedure(s) Performed: CATARACT EXTRACTION PHACO AND INTRAOCULAR LENS PLACEMENT (IOC)  RIGHT (Right Eye)  Patient location during evaluation: PACU Anesthesia Type: MAC Level of consciousness: awake and alert and oriented Pain management: satisfactory to patient Vital Signs Assessment: post-procedure vital signs reviewed and stable Respiratory status: spontaneous breathing, nonlabored ventilation and respiratory function stable Cardiovascular status: blood pressure returned to baseline and stable Postop Assessment: Adequate PO intake and No signs of nausea or vomiting Anesthetic complications: no    Raliegh Ip

## 2017-09-08 NOTE — Transfer of Care (Signed)
Immediate Anesthesia Transfer of Care Note  Patient: Abigail Delacruz  Procedure(s) Performed: CATARACT EXTRACTION PHACO AND INTRAOCULAR LENS PLACEMENT ([facility])  RIGHT (Right Eye)  Patient Location: PACU  Anesthesia Type: MAC  Level of Consciousness: awake, alert  and patient cooperative  Airway and Oxygen Therapy: Patient Spontanous Breathing and Patient connected to supplemental oxygen  Post-op Assessment: Post-op Vital signs reviewed, Patient's Cardiovascular Status Stable, Respiratory Function Stable, Patent Airway and No signs of Nausea or vomiting  Post-op Vital Signs: Reviewed and stable  Complications: No apparent anesthesia complications

## 2017-09-09 ENCOUNTER — Encounter: Payer: Self-pay | Admitting: Ophthalmology

## 2017-09-12 DIAGNOSIS — H2512 Age-related nuclear cataract, left eye: Secondary | ICD-10-CM | POA: Diagnosis not present

## 2017-09-17 ENCOUNTER — Encounter: Payer: Self-pay | Admitting: *Deleted

## 2017-09-17 ENCOUNTER — Other Ambulatory Visit: Payer: Self-pay

## 2017-09-22 ENCOUNTER — Ambulatory Visit
Admission: RE | Admit: 2017-09-22 | Discharge: 2017-09-22 | Disposition: A | Discharge status: Home / Self Care | Payer: Medicare HMO | Source: Ambulatory Visit | Attending: Nurse Practitioner | Admitting: Nurse Practitioner

## 2017-09-22 DIAGNOSIS — Z1231 Encounter for screening mammogram for malignant neoplasm of breast: Secondary | ICD-10-CM | POA: Diagnosis not present

## 2017-09-22 DIAGNOSIS — M8589 Other specified disorders of bone density and structure, multiple sites: Secondary | ICD-10-CM | POA: Diagnosis not present

## 2017-09-22 DIAGNOSIS — Z780 Asymptomatic menopausal state: Secondary | ICD-10-CM | POA: Insufficient documentation

## 2017-09-22 DIAGNOSIS — Z1239 Encounter for other screening for malignant neoplasm of breast: Secondary | ICD-10-CM

## 2017-09-22 IMAGING — MG MM DIGITAL SCREENING BILAT W/ TOMO W/ CAD
8 series · 8 of 24 positions shown · non-contrast
Comparison: Previous exam(s).

CLINICAL DATA: Screening.

EXAM:
DIGITAL SCREENING BILATERAL MAMMOGRAM WITH TOMO AND CAD

[L CC synth-2D]
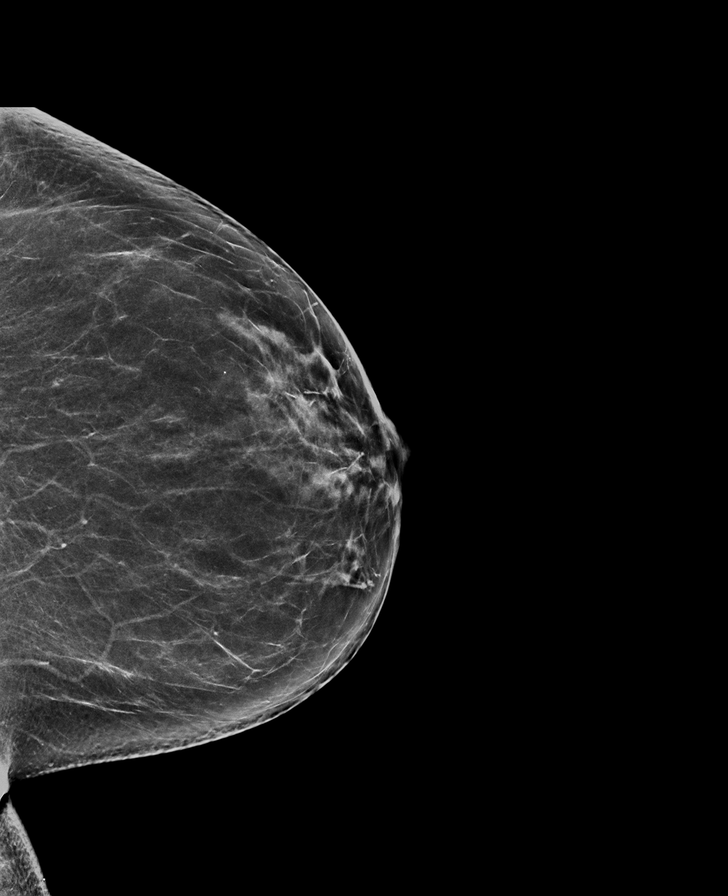

[R MLO synth-2D]
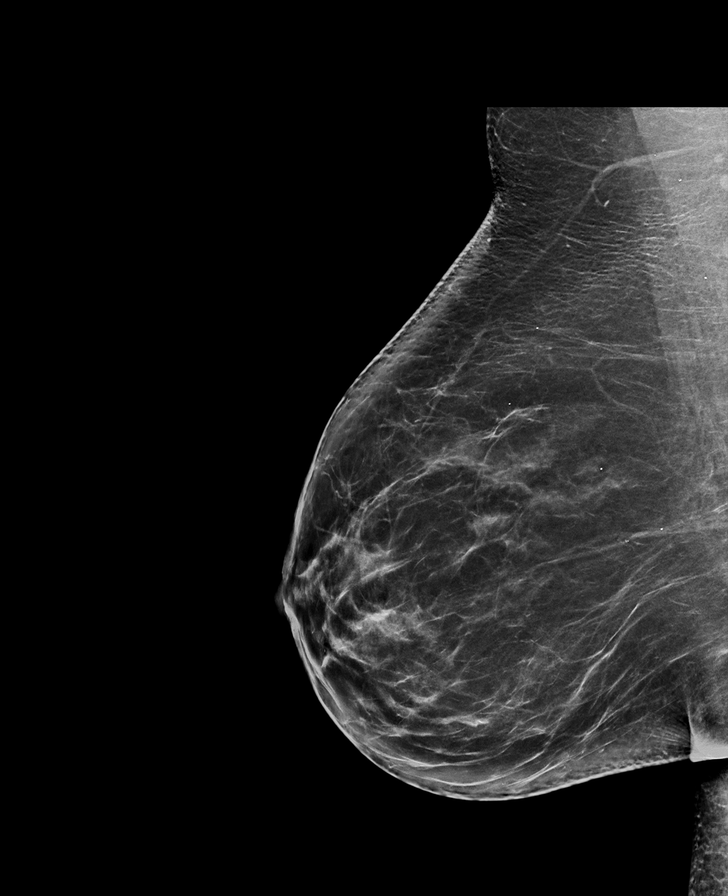

[L MLO synth-2D]
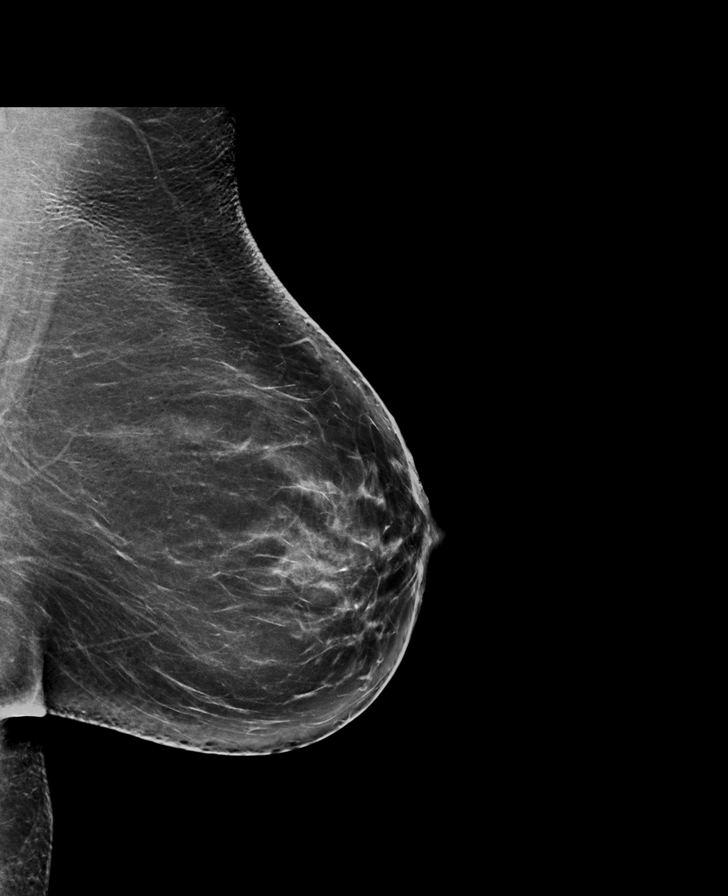

[R CC synth-2D]
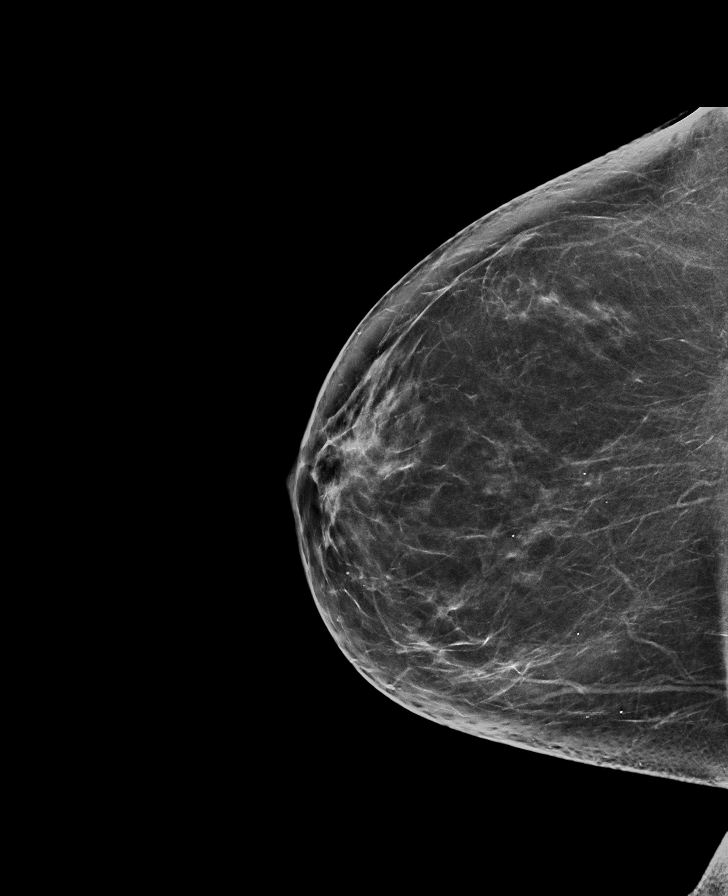

[R MLO tomo · tomo slice 41/81.0]
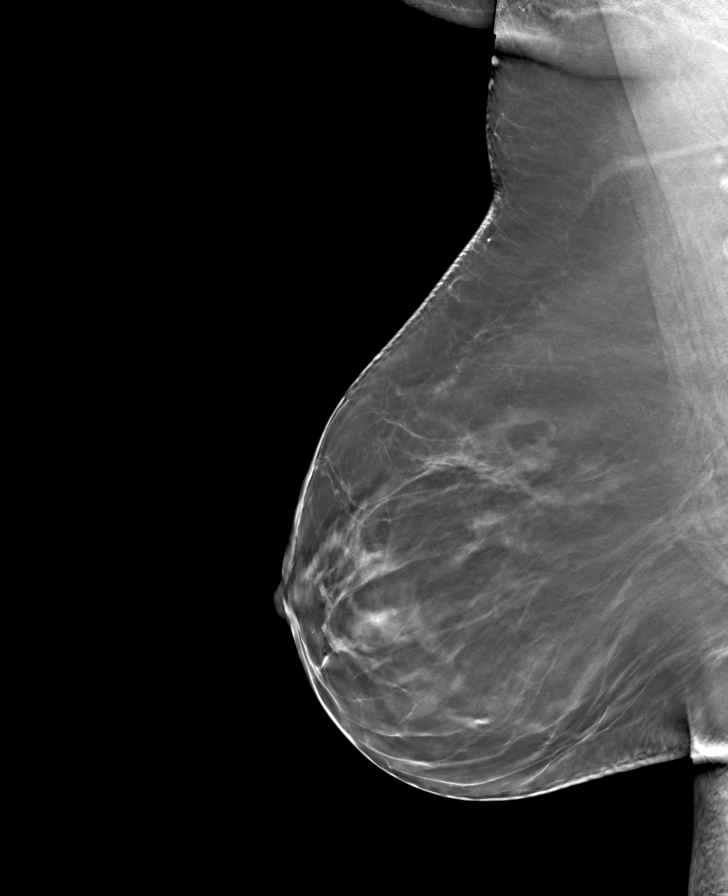

[L CC tomo · tomo slice 36/71.0]
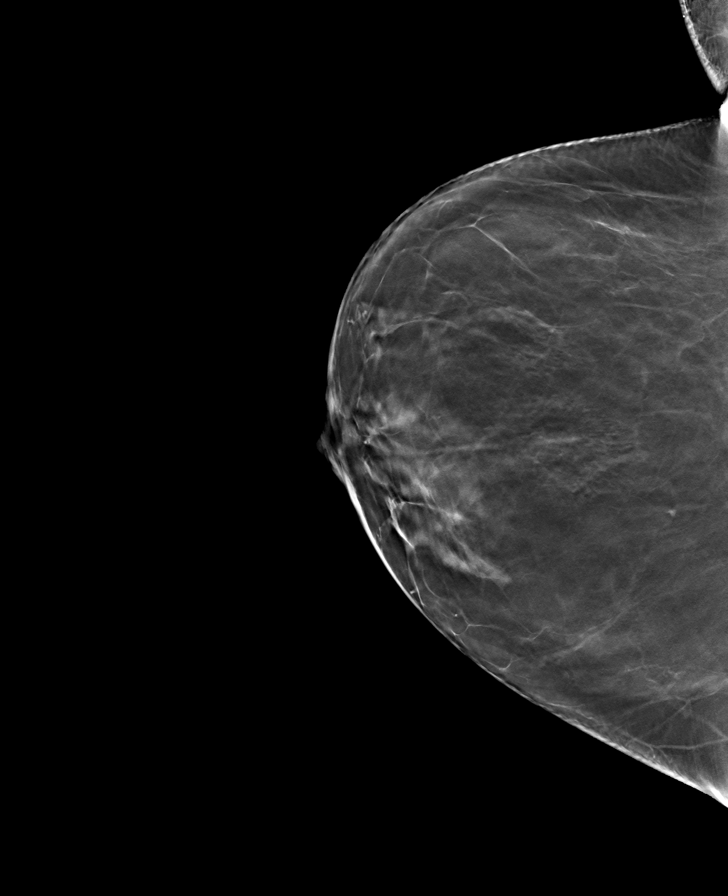

[L MLO tomo · tomo slice 43/84.0]
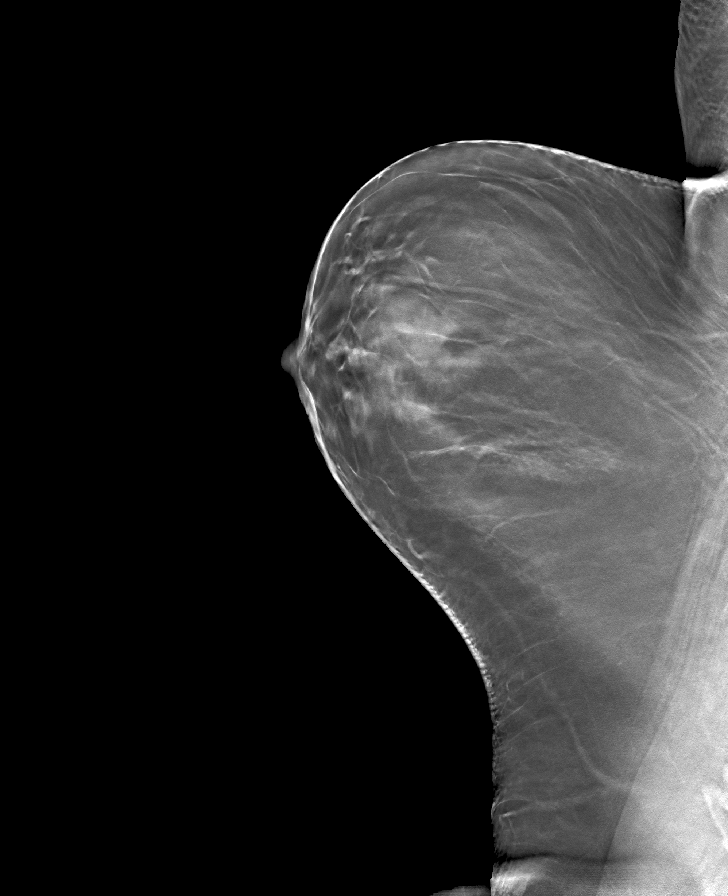

[R CC tomo · tomo slice 37/74.0]
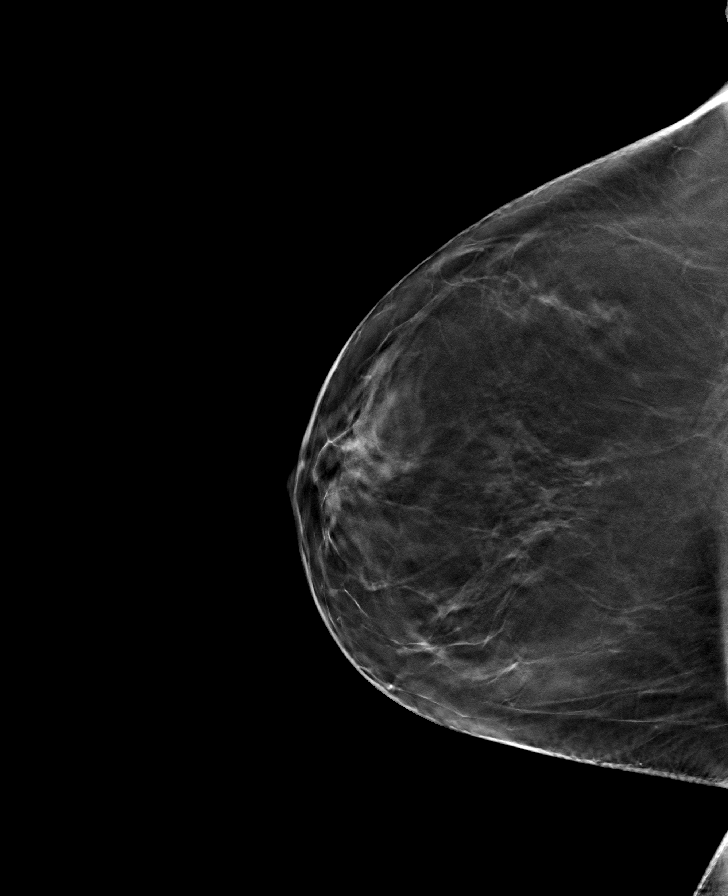

[8 of 24 positions shown; findings below may reference images not displayed]

ACR Breast Density Category b: There are scattered areas of
fibroglandular density.
FINDINGS: There are no findings suspicious for malignancy. Images were
processed with CAD.
IMPRESSION: No mammographic evidence of malignancy. A result letter of this
screening mammogram will be mailed directly to the patient.

RECOMMENDATION:
Screening mammogram in one year. (Code:[TQ])

BI-RADS CATEGORY  1: Negative.

## 2017-09-24 NOTE — Discharge Instructions (Signed)

## 2017-09-25 DIAGNOSIS — Z1211 Encounter for screening for malignant neoplasm of colon: Secondary | ICD-10-CM | POA: Diagnosis not present

## 2017-09-25 DIAGNOSIS — Z1212 Encounter for screening for malignant neoplasm of rectum: Secondary | ICD-10-CM | POA: Diagnosis not present

## 2017-09-25 LAB — COLOGUARD: Cologuard: POSITIVE

## 2017-09-29 ENCOUNTER — Ambulatory Visit: Payer: Medicare HMO | Admitting: Anesthesiology

## 2017-09-29 ENCOUNTER — Ambulatory Visit
Admission: RE | Admit: 2017-09-29 | Discharge: 2017-09-29 | Disposition: A | Discharge status: Home / Self Care | Payer: Medicare HMO | Source: Ambulatory Visit | Attending: Ophthalmology | Admitting: Ophthalmology

## 2017-09-29 ENCOUNTER — Encounter
Admission: RE | Disposition: A | Discharge status: Home / Self Care | Payer: Self-pay | Source: Ambulatory Visit | Attending: Ophthalmology

## 2017-09-29 DIAGNOSIS — Z87891 Personal history of nicotine dependence: Secondary | ICD-10-CM | POA: Insufficient documentation

## 2017-09-29 DIAGNOSIS — H25812 Combined forms of age-related cataract, left eye: Secondary | ICD-10-CM | POA: Diagnosis not present

## 2017-09-29 DIAGNOSIS — E7800 Pure hypercholesterolemia, unspecified: Secondary | ICD-10-CM | POA: Diagnosis not present

## 2017-09-29 DIAGNOSIS — H2512 Age-related nuclear cataract, left eye: Secondary | ICD-10-CM | POA: Insufficient documentation

## 2017-09-29 DIAGNOSIS — Z79899 Other long term (current) drug therapy: Secondary | ICD-10-CM | POA: Insufficient documentation

## 2017-09-29 HISTORY — PX: CATARACT EXTRACTION W/PHACO: SHX586

## 2017-09-29 SURGERY — PHACOEMULSIFICATION, CATARACT, WITH IOL INSERTION
Anesthesia: Monitor Anesthesia Care | Site: Eye | Laterality: Left | Wound class: "Clean "

## 2017-09-29 MED ORDER — LACTATED RINGERS IV SOLN: 10.0000 mL/h | INTRAVENOUS | Status: DC

## 2017-09-29 MED ORDER — LIDOCAINE HCL (PF) 2 % IJ SOLN
INTRAOCULAR | Status: DC | PRN
  Administered 2017-09-29: 1 mL via INTRAOCULAR

## 2017-09-29 MED ORDER — SODIUM HYALURONATE 23 MG/ML IO SOLN
INTRAOCULAR | Status: DC | PRN
  Administered 2017-09-29: 0.6 mL via INTRAOCULAR

## 2017-09-29 MED ORDER — PHENYLEPHRINE HCL 10 % OP SOLN
1.0000 [drp] | OPHTHALMIC | Status: DC | PRN
  Administered 2017-09-29 (×3): 1 [drp] via OPHTHALMIC

## 2017-09-29 MED ORDER — CYCLOPENTOLATE HCL 2 % OP SOLN
1.0000 [drp] | OPHTHALMIC | Status: DC | PRN
  Administered 2017-09-29 (×3): 1 [drp] via OPHTHALMIC

## 2017-09-29 MED ORDER — EPINEPHRINE PF 1 MG/ML IJ SOLN
INTRAOCULAR | Status: DC | PRN
  Administered 2017-09-29: 90 mL via OPHTHALMIC

## 2017-09-29 MED ORDER — SODIUM HYALURONATE 10 MG/ML IO SOLN
INTRAOCULAR | Status: DC | PRN
  Administered 2017-09-29: 0.55 mL via INTRAOCULAR

## 2017-09-29 MED ORDER — TETRACAINE HCL 0.5 % OP SOLN
1.0000 [drp] | OPHTHALMIC | Status: DC | PRN
  Administered 2017-09-29 (×2): 1 [drp] via OPHTHALMIC

## 2017-09-29 MED ORDER — FENTANYL CITRATE (PF) 100 MCG/2ML IJ SOLN
INTRAMUSCULAR | Status: DC | PRN
  Administered 2017-09-29: 50 ug via INTRAVENOUS

## 2017-09-29 MED ORDER — ONDANSETRON HCL 4 MG/2ML IJ SOLN: 4.0000 mg | Freq: Once | INTRAMUSCULAR | Status: DC | PRN

## 2017-09-29 MED ORDER — MOXIFLOXACIN HCL 0.5 % OP SOLN
OPHTHALMIC | Status: DC | PRN
  Administered 2017-09-29: .1 mL via OPHTHALMIC

## 2017-09-29 MED ORDER — MIDAZOLAM HCL 2 MG/2ML IJ SOLN
INTRAMUSCULAR | Status: DC | PRN
  Administered 2017-09-29: 2 mg via INTRAVENOUS

## 2017-09-29 SURGICAL SUPPLY — 17 items

## 2017-09-29 NOTE — Transfer of Care (Signed)
Immediate Anesthesia Transfer of Care Note  Patient: Abigail Delacruz  Procedure(s) Performed: CATARACT EXTRACTION PHACO AND INTRAOCULAR LENS PLACEMENT ([facility]) LEFT (Left Eye)  Patient Location: PACU  Anesthesia Type: MAC  Level of Consciousness: awake, alert  and patient cooperative  Airway and Oxygen Therapy: Patient Spontanous Breathing and Patient connected to supplemental oxygen  Post-op Assessment: Post-op Vital signs reviewed, Patient's Cardiovascular Status Stable, Respiratory Function Stable, Patent Airway and No signs of Nausea or vomiting  Post-op Vital Signs: Reviewed and stable  Complications: No apparent anesthesia complications

## 2017-09-29 NOTE — Anesthesia Procedure Notes (Signed)
Procedure Name: MAC Date/Time: 09/29/2017 11:29 AM Performed by: Janna Arch, CRNA Pre-anesthesia Checklist: Patient identified, Emergency Drugs available and Suction available Patient Re-evaluated:Patient Re-evaluated prior to induction Oxygen Delivery Method: Nasal cannula

## 2017-09-29 NOTE — Op Note (Signed)
OPERATIVE NOTE  Abigail Delacruz [id number] 09/29/2017   PREOPERATIVE DIAGNOSIS:  Nuclear sclerotic cataract left eye.  H25.12   POSTOPERATIVE DIAGNOSIS:    Nuclear sclerotic cataract left eye.     PROCEDURE:  Phacoemusification with posterior chamber intraocular lens placement of the left eye   LENS:   Implant Name Type Inv. Item Serial No. Manufacturer Lot No. LRB No. Used  LENS IOL DIOP 15.0 - [id number] Intraocular Lens LENS IOL DIOP 15.0 [id number] AMO  Left 1       PCB00 +15.0   ULTRASOUND TIME: 0 minutes 54 seconds.  CDE 10.37   SURGEON:  Benay Pillow, MD, MPH   ANESTHESIA:  Topical with tetracaine drops augmented with 1% preservative-free intracameral lidocaine.  ESTIMATED BLOOD LOSS: <1 mL   COMPLICATIONS:  None.   DESCRIPTION OF PROCEDURE:  The patient was identified in the holding room and transported to the operating room and placed in the supine position under the operating microscope.  The left eye was identified as the operative eye and it was prepped and draped in the usual sterile ophthalmic fashion.   A 1.0 millimeter clear-corneal paracentesis was made at the 5:00 position. 0.5 ml of preservative-free 1% lidocaine with epinephrine was injected into the anterior chamber.  The anterior chamber was filled with Healon 5 viscoelastic.  A 2.4 millimeter keratome was used to make a near-clear corneal incision at the 2:00 position.  A curvilinear capsulorrhexis was made with a cystotome and capsulorrhexis forceps.  Balanced salt solution was used to hydrodissect and hydrodelineate the nucleus.   Phacoemulsification was then used in stop and chop fashion to remove the lens nucleus and epinucleus.  The remaining cortex was then removed using the irrigation and aspiration handpiece. Healon was then placed into the capsular bag to distend it for lens placement.  A lens was then injected into the capsular bag.  The remaining viscoelastic was aspirated.   Wounds were hydrated  with balanced salt solution.  The anterior chamber was inflated to a physiologic pressure with balanced salt solution.  Intracameral vigamox 0.1 mL undiltued was injected into the eye and a drop placed onto the ocular surface.  No wound leaks were noted.  The patient was taken to the recovery room in stable condition without complications of anesthesia or surgery  Benay Pillow 09/29/2017, 11:53 AM

## 2017-09-29 NOTE — Anesthesia Postprocedure Evaluation (Signed)
Anesthesia Post Note  Patient: Abigail Delacruz  Procedure(s) Performed: CATARACT EXTRACTION PHACO AND INTRAOCULAR LENS PLACEMENT (IOC) LEFT (Left Eye)  Patient location during evaluation: PACU Anesthesia Type: MAC Level of consciousness: awake Pain management: pain level controlled Vital Signs Assessment: post-procedure vital signs reviewed and stable Respiratory status: respiratory function stable Cardiovascular status: stable Postop Assessment: no signs of nausea or vomiting Anesthetic complications: no    Veda Canning

## 2017-09-29 NOTE — Anesthesia Preprocedure Evaluation (Signed)
Anesthesia Evaluation  Patient identified by MRN, date of birth, ID band Patient awake    Reviewed: Allergy & Precautions, H&P , NPO status , Patient's Chart, lab work & pertinent test results  Airway Mallampati: II  TM Distance: >3 FB Neck ROM: full    Dental   Pulmonary former smoker,    breath sounds clear to auscultation       Cardiovascular  Rhythm:regular Rate:Normal  HLD   Neuro/Psych    GI/Hepatic negative GI ROS,   Endo/Other  BMI 33  Renal/GU      Musculoskeletal   Abdominal   Peds  Hematology   Anesthesia Other Findings   Reproductive/Obstetrics                             Anesthesia Physical  Anesthesia Plan  ASA: II  Anesthesia Plan: MAC   Post-op Pain Management:    Induction:   PONV Risk Score and Plan: 2 and Treatment may vary due to age or medical condition and Midazolam  Airway Management Planned:   Additional Equipment:   Intra-op Plan:   Post-operative Plan:   Informed Consent: I have reviewed the patients History and Physical, chart, labs and discussed the procedure including the risks, benefits and alternatives for the proposed anesthesia with the patient or authorized representative who has indicated his/her understanding and acceptance.     Plan Discussed with: CRNA  Anesthesia Plan Comments:         Anesthesia Quick Evaluation

## 2017-09-29 NOTE — H&P (Signed)
The History and Physical notes are on paper, have been signed, and are to be scanned.   I have examined the patient and there are no changes to the H&P.   Abigail Delacruz 09/29/2017 11:12 AM

## 2017-10-01 ENCOUNTER — Encounter: Payer: Self-pay | Admitting: Nurse Practitioner

## 2017-10-01 DIAGNOSIS — E782 Mixed hyperlipidemia: Secondary | ICD-10-CM | POA: Insufficient documentation

## 2017-10-13 DIAGNOSIS — L820 Inflamed seborrheic keratosis: Secondary | ICD-10-CM | POA: Diagnosis not present

## 2017-10-13 DIAGNOSIS — L821 Other seborrheic keratosis: Secondary | ICD-10-CM | POA: Diagnosis not present

## 2017-10-13 DIAGNOSIS — L918 Other hypertrophic disorders of the skin: Secondary | ICD-10-CM | POA: Diagnosis not present

## 2017-10-14 ENCOUNTER — Other Ambulatory Visit: Payer: Self-pay

## 2017-10-14 ENCOUNTER — Telehealth: Payer: Self-pay | Admitting: Nurse Practitioner

## 2017-10-14 DIAGNOSIS — R195 Other fecal abnormalities: Secondary | ICD-10-CM

## 2017-10-14 MED ORDER — NA SULFATE-K SULFATE-MG SULF 17.5-3.13-1.6 GM/177ML PO SOLN: 1.0000 | Freq: Once | ORAL | 0 refills | Status: AC

## 2017-10-14 NOTE — Progress Notes (Signed)
Instructions are being sent via email to: [email or web address].  Rx being sent to [facility].  Thanks [employer]

## 2017-10-14 NOTE — Telephone Encounter (Signed)
Patient called with results of positive Cologuard.  Discussed need for colonoscopy with patient.  Patient agrees.  Will place referral. Patient expresses concern, reinforced that it could be false positive, polyp (non-cancerous), polyp (pre-cancerous) and most rarely, colon cancer.  All other questions answered.  Patient verbalizes understanding.

## 2017-10-15 ENCOUNTER — Encounter: Payer: Self-pay | Admitting: *Deleted

## 2017-10-15 ENCOUNTER — Other Ambulatory Visit: Payer: Self-pay

## 2017-10-16 NOTE — Discharge Instructions (Signed)
General Anesthesia, Adult, Care After °These instructions provide you with information about caring for yourself after your procedure. Your health care provider may also give you more specific instructions. Your treatment has been planned according to current medical practices, but problems sometimes occur. Call your health care provider if you have any problems or questions after your procedure. °What can I expect after the procedure? °After the procedure, it is common to have: °· Vomiting. °· A sore throat. °· Mental slowness. ° °It is common to feel: °· Nauseous. °· Cold or shivery. °· Sleepy. °· Tired. °· Sore or achy, even in parts of your body where you did not have surgery. ° °Follow these instructions at home: °For at least 24 hours after the procedure: °· Do not: °? Participate in activities where you could fall or become injured. °? Drive. °? Use heavy machinery. °? Drink alcohol. °? Take sleeping pills or medicines that cause drowsiness. °? Make important decisions or sign legal documents. °? Take care of children on your own. °· Rest. °Eating and drinking °· If you vomit, drink water, juice, or soup when you can drink without vomiting. °· Drink enough fluid to keep your urine clear or pale yellow. °· Make sure you have little or no nausea before eating solid foods. °· Follow the diet recommended by your health care provider. °General instructions °· Have a responsible adult stay with you until you are awake and alert. °· Return to your normal activities as told by your health care provider. Ask your health care provider what activities are safe for you. °· Take over-the-counter and prescription medicines only as told by your health care provider. °· If you smoke, do not smoke without supervision. °· Keep all follow-up visits as told by your health care provider. This is important. °Contact a health care provider if: °· You continue to have nausea or vomiting at home, and medicines are not helpful. °· You  cannot drink fluids or start eating again. °· You cannot urinate after 8-12 hours. °· You develop a skin rash. °· You have fever. °· You have increasing redness at the site of your procedure. °Get help right away if: °· You have difficulty breathing. °· You have chest pain. °· You have unexpected bleeding. °· You feel that you are having a life-threatening or urgent problem. °This information is not intended to replace advice given to you by your health care provider. Make sure you discuss any questions you have with your health care provider. °Document Released: 05/20/2000 Document Revised: 07/17/2015 Document Reviewed: 01/26/2015 °Elsevier Interactive Patient Education © 2018 [employer] ° °

## 2017-10-20 ENCOUNTER — Ambulatory Visit: Payer: Medicare HMO | Admitting: Anesthesiology

## 2017-10-20 ENCOUNTER — Ambulatory Visit
Admission: RE | Admit: 2017-10-20 | Discharge: 2017-10-20 | Disposition: A | Discharge status: Home / Self Care | Payer: Medicare HMO | Source: Ambulatory Visit | Attending: Gastroenterology | Admitting: Gastroenterology

## 2017-10-20 ENCOUNTER — Encounter
Admission: RE | Disposition: A | Discharge status: Home / Self Care | Payer: Self-pay | Source: Ambulatory Visit | Attending: Gastroenterology

## 2017-10-20 DIAGNOSIS — E785 Hyperlipidemia, unspecified: Secondary | ICD-10-CM | POA: Insufficient documentation

## 2017-10-20 DIAGNOSIS — D125 Benign neoplasm of sigmoid colon: Secondary | ICD-10-CM | POA: Diagnosis not present

## 2017-10-20 DIAGNOSIS — K5730 Diverticulosis of large intestine without perforation or abscess without bleeding: Secondary | ICD-10-CM | POA: Diagnosis not present

## 2017-10-20 DIAGNOSIS — R195 Other fecal abnormalities: Secondary | ICD-10-CM | POA: Diagnosis not present

## 2017-10-20 DIAGNOSIS — K641 Second degree hemorrhoids: Secondary | ICD-10-CM | POA: Insufficient documentation

## 2017-10-20 DIAGNOSIS — Z87891 Personal history of nicotine dependence: Secondary | ICD-10-CM | POA: Diagnosis not present

## 2017-10-20 DIAGNOSIS — K635 Polyp of colon: Secondary | ICD-10-CM | POA: Insufficient documentation

## 2017-10-20 HISTORY — PX: COLONOSCOPY WITH PROPOFOL: SHX5780

## 2017-10-20 SURGERY — COLONOSCOPY WITH PROPOFOL
Anesthesia: General | Site: Rectum | Wound class: Dirty or Infected

## 2017-10-20 MED ORDER — ACETAMINOPHEN 325 MG PO TABS: 325.0000 mg | ORAL_TABLET | Freq: Once | ORAL | Status: DC

## 2017-10-20 MED ORDER — STERILE WATER FOR IRRIGATION IR SOLN
Status: DC | PRN
  Administered 2017-10-20: 10:00:00

## 2017-10-20 MED ORDER — SODIUM CHLORIDE 0.9 % IV SOLN: INTRAVENOUS | Status: DC

## 2017-10-20 MED ORDER — ACETAMINOPHEN 160 MG/5ML PO SOLN: 325.0000 mg | Freq: Once | ORAL | Status: DC

## 2017-10-20 MED ORDER — PROPOFOL 10 MG/ML IV BOLUS
INTRAVENOUS | Status: DC | PRN
  Administered 2017-10-20 (×2): 20 mg via INTRAVENOUS
  Administered 2017-10-20: 40 mg via INTRAVENOUS
  Administered 2017-10-20: 100 mg via INTRAVENOUS
  Administered 2017-10-20: 20 mg via INTRAVENOUS
  Administered 2017-10-20: 40 mg via INTRAVENOUS

## 2017-10-20 MED ORDER — LACTATED RINGERS IV SOLN
INTRAVENOUS | Status: DC
  Administered 2017-10-20: 10:00:00 via INTRAVENOUS

## 2017-10-20 MED ORDER — LIDOCAINE HCL (CARDIAC) PF 100 MG/5ML IV SOSY
PREFILLED_SYRINGE | INTRAVENOUS | Status: DC | PRN
  Administered 2017-10-20: 30 mg via INTRAVENOUS

## 2017-10-20 MED ORDER — LACTATED RINGERS IV SOLN: INTRAVENOUS | Status: DC

## 2017-10-20 SURGICAL SUPPLY — 24 items
CANISTER SUCT 1200ML W/VALVE (MISCELLANEOUS) ×2 IMPLANT
CLIP HMST 235XBRD CATH ROT (MISCELLANEOUS) IMPLANT
CLIP RESOLUTION 360 11X235 (MISCELLANEOUS)
ELECT REM PT RETURN 9FT ADLT (ELECTROSURGICAL)
ELECTRODE REM PT RTRN 9FT ADLT (ELECTROSURGICAL) IMPLANT
FCP ESCP3.2XJMB 240X2.8X (MISCELLANEOUS)
FORCEPS BIOP RAD 4 LRG CAP 4 (CUTTING FORCEPS) ×2 IMPLANT
FORCEPS BIOP RJ4 240 W/NDL (MISCELLANEOUS)
FORCEPS ESCP3.2XJMB 240X2.8X (MISCELLANEOUS) IMPLANT
GOWN CVR UNV OPN BCK APRN NK (MISCELLANEOUS) ×2 IMPLANT
GOWN ISOL THUMB LOOP REG UNIV (MISCELLANEOUS) ×2
INJECTOR VARIJECT VIN23 (MISCELLANEOUS) IMPLANT
KIT DEFENDO VALVE AND CONN (KITS) IMPLANT
KIT ENDO PROCEDURE OLY (KITS) ×2 IMPLANT
MARKER SPOT ENDO TATTOO 5ML (MISCELLANEOUS) IMPLANT
PROBE APC STR FIRE (PROBE) IMPLANT
RETRIEVER NET ROTH 2.5X230 LF (MISCELLANEOUS) IMPLANT
SNARE SHORT THROW 13M SML OVAL (MISCELLANEOUS) IMPLANT
SNARE SHORT THROW 30M LRG OVAL (MISCELLANEOUS) IMPLANT
SNARE SNG USE RND 15MM (INSTRUMENTS) IMPLANT
SPOT EX ENDOSCOPIC TATTOO (MISCELLANEOUS)
TRAP ETRAP POLY (MISCELLANEOUS) IMPLANT
VARIJECT INJECTOR VIN23 (MISCELLANEOUS)
WATER STERILE IRR 250ML POUR (IV SOLUTION) ×2 IMPLANT

## 2017-10-20 NOTE — Anesthesia Procedure Notes (Signed)
Performed by: Tynlee Bayle, CRNA Pre-anesthesia Checklist: Patient identified, Emergency Drugs available, Suction available, Timeout performed and Patient being monitored Patient Re-evaluated:Patient Re-evaluated prior to induction Oxygen Delivery Method: Nasal cannula Placement Confirmation: positive ETCO2       

## 2017-10-20 NOTE — Transfer of Care (Signed)
Immediate Anesthesia Transfer of Care Note  Patient: Abigail Delacruz  Procedure(s) Performed: COLONOSCOPY WITH PROPOFOL (N/A Rectum)  Patient Location: PACU  Anesthesia Type: General  Level of Consciousness: awake, alert  and patient cooperative  Airway and Oxygen Therapy: Patient Spontanous Breathing and Patient connected to supplemental oxygen  Post-op Assessment: Post-op Vital signs reviewed, Patient's Cardiovascular Status Stable, Respiratory Function Stable, Patent Airway and No signs of Nausea or vomiting  Post-op Vital Signs: Reviewed and stable  Complications: No apparent anesthesia complications

## 2017-10-20 NOTE — Anesthesia Preprocedure Evaluation (Signed)
Anesthesia Evaluation  Patient identified by MRN, date of birth, ID band Patient awake    Reviewed: Allergy & Precautions, H&P , NPO status , Patient's Chart, lab work & pertinent test results  Airway Mallampati: II  TM Distance: >3 FB Neck ROM: full    Dental no notable dental hx.    Pulmonary former smoker,    Pulmonary exam normal breath sounds clear to auscultation       Cardiovascular Normal cardiovascular exam Rhythm:regular Rate:Normal     Neuro/Psych    GI/Hepatic   Endo/Other    Renal/GU      Musculoskeletal   Abdominal   Peds  Hematology   Anesthesia Other Findings   Reproductive/Obstetrics                             Anesthesia Physical  Anesthesia Plan  ASA: II  Anesthesia Plan: General   Post-op Pain Management:    Induction: Intravenous  PONV Risk Score and Plan: 3 and Treatment may vary due to age or medical condition and Propofol infusion  Airway Management Planned: Natural Airway  Additional Equipment:   Intra-op Plan:   Post-operative Plan:   Informed Consent: I have reviewed the patients History and Physical, chart, labs and discussed the procedure including the risks, benefits and alternatives for the proposed anesthesia with the patient or authorized representative who has indicated his/her understanding and acceptance.     Plan Discussed with: CRNA  Anesthesia Plan Comments:         Anesthesia Quick Evaluation

## 2017-10-20 NOTE — H&P (Signed)
Lucilla Lame, MD [facility] [address], [place], [place] [address] Phone:[phone] Fax : [phone]  Primary Care Physician:  Mikey College, NP Primary Gastroenterologist:  Dr. Allen Norris  Pre-Procedure History & Physical: HPI:  Abigail Delacruz is a 67 y.o. female is here for an colonoscopy.   Past Medical History:  Diagnosis Date  . Cataract   . Hyperlipidemia     Past Surgical History:  Procedure Laterality Date  . APPENDECTOMY  1971  . CATARACT EXTRACTION W/PHACO Right 09/08/2017   Procedure: CATARACT EXTRACTION PHACO AND INTRAOCULAR LENS PLACEMENT ([place])  RIGHT;  Surgeon: Eulogio Bear, MD;  Location: [facility];  Service: Ophthalmology;  Laterality: Right;  . CATARACT EXTRACTION W/PHACO Left 09/29/2017   Procedure: CATARACT EXTRACTION PHACO AND INTRAOCULAR LENS PLACEMENT ([place]) LEFT;  Surgeon: Eulogio Bear, MD;  Location: [facility];  Service: Ophthalmology;  Laterality: Left;    Prior to Admission medications   Medication Sig Start Date End Date Taking? Authorizing Provider  atorvastatin (LIPITOR) 20 MG tablet Take 1 tablet (20 mg total) by mouth daily. 08/26/17  Yes Mikey College, NP    Allergies as of 10/14/2017  . (No Known Allergies)    Family History  Problem Relation Age of Onset  . Kidney failure Mother   . Cirrhosis Father   . Alcohol abuse Father   . Diabetes Brother   . Hypertension Brother   . Arthritis Brother   . Breast cancer Neg Hx     Social History   Socioeconomic History  . Marital status: Married    Spouse name: Not on file  . Number of children: 2  . Years of education: Not on file  . Highest education level: Some college, no degree  Occupational History  . Not on file  Social Needs  . Financial resource strain: Not on file  . Food insecurity:    Worry: Not on file    Inability: Not on file  . Transportation needs:    Medical: Not on file    Non-medical: Not on file  Tobacco Use  .  Smoking status: Former Smoker    Last attempt to quit: 1974    Years since quitting: 45.6  . Smokeless tobacco: Never Used  Substance and Sexual Activity  . Alcohol use: Not Currently    Frequency: Never    Comment: social 1x/yr  . Drug use: Never  . Sexual activity: Not Currently  Lifestyle  . Physical activity:    Days per week: Not on file    Minutes per session: Not on file  . Stress: Not on file  Relationships  . Social connections:    Talks on phone: Not on file    Gets together: Not on file    Attends religious service: Not on file    Active member of club or organization: Not on file    Attends meetings of clubs or organizations: Not on file    Relationship status: Not on file  . Intimate partner violence:    Fear of current or ex partner: Not on file    Emotionally abused: Not on file    Physically abused: Not on file    Forced sexual activity: Not on file  Other Topics Concern  . Not on file  Social History Narrative  . Not on file    Review of Systems: See HPI, otherwise negative ROS  Physical Exam: Ht 5' 1.5" (1.562 m)   Wt 81.2 kg  BMI 33.27 kg/m  General:   Alert,  pleasant and cooperative in NAD Head:  Normocephalic and atraumatic. Neck:  Supple; no masses or thyromegaly. Lungs:  Clear throughout to auscultation.    Heart:  Regular rate and rhythm. Abdomen:  Soft, nontender and nondistended. Normal bowel sounds, without guarding, and without rebound.   Neurologic:  Alert and  oriented x4;  grossly normal neurologically.  Impression/Plan: Abigail Delacruz is here for an colonoscopy to be performed for positive cologuard  Risks, benefits, limitations, and alternatives regarding  colonoscopy have been reviewed with the patient.  Questions have been answered.  All parties agreeable.   Lucilla Lame, MD  10/20/2017, 9:49 AM

## 2017-10-20 NOTE — Op Note (Signed)
[facility] Gastroenterology Patient Name: Abigail Delacruz Procedure Date: 10/20/2017 10:15 AM MRN: [id number] Account #: [id number] Date of Birth: 06/18/1950 Admit Type: Outpatient Age: 67 Room: [facility] OR ROOM 01 Gender: Female Note Status: Finalized Procedure:            Colonoscopy Indications:          Positive Cologuard test Providers:            Lucilla Lame MD, MD Referring MD:         Olin Hauser (Referring MD) Medicines:            Propofol per Anesthesia Complications:        No immediate complications. Procedure:            Pre-Anesthesia Assessment:                       - Prior to the procedure, a History and Physical was                        performed, and patient medications and allergies were                        reviewed. The patient's tolerance of previous                        anesthesia was also reviewed. The risks and benefits of                        the procedure and the sedation options and risks were                        discussed with the patient. All questions were                        answered, and informed consent was obtained. Prior                        Anticoagulants: The patient has taken no previous                        anticoagulant or antiplatelet agents. ASA Grade                        Assessment: II - A patient with mild systemic disease.                        After reviewing the risks and benefits, the patient was                        deemed in satisfactory condition to undergo the                        procedure.                       After obtaining informed consent, the colonoscope was                        passed under direct vision. Throughout the procedure,  the patient's blood pressure, pulse, and oxygen                        saturations were monitored continuously. The was                        introduced through the anus and advanced to the the    cecum, identified by appendiceal orifice and ileocecal                        valve. The colonoscopy was performed without                        difficulty. The patient tolerated the procedure well.                        The quality of the bowel preparation was good. Findings:      The perianal and digital rectal examinations were normal.      Multiple small-mouthed diverticula were found in the sigmoid colon.      A 2 mm polyp was found in the sigmoid colon. The polyp was sessile. The       polyp was removed with a cold biopsy forceps. Resection and retrieval       were complete.      Non-bleeding internal hemorrhoids were found during retroflexion. The       hemorrhoids were Grade II (internal hemorrhoids that prolapse but reduce       spontaneously). Impression:           - Diverticulosis in the sigmoid colon.                       - One 2 mm polyp in the sigmoid colon, removed with a                        cold biopsy forceps. Resected and retrieved.                       - Non-bleeding internal hemorrhoids. Recommendation:       - Discharge patient to home.                       - Resume previous diet.                       - Continue present medications.                       - Await pathology results.                       - Repeat colonoscopy in 5 years if polyp adenoma and 10                        years if hyperplastic Procedure Code(s):    --- Professional ---                       [phone], Colonoscopy, flexible; with biopsy, single or                        multiple Diagnosis Code(s):    --- Professional ---  R19.5, Other fecal abnormalities                       D12.5, Benign neoplasm of sigmoid colon CPT copyright 2017 American Medical Association. All rights reserved. The codes documented in this report are preliminary and upon coder review may  be revised to meet current compliance requirements. Lucilla Lame MD, MD 10/20/2017 10:38:17 AM This  report has been signed electronically. Number of Addenda: 0 Note Initiated On: 10/20/2017 10:15 AM Scope Withdrawal Time: 0 hours 6 minutes 58 seconds  Total Procedure Duration: 0 hours 10 minutes 26 seconds       [facility]

## 2017-10-20 NOTE — Anesthesia Postprocedure Evaluation (Signed)
Anesthesia Post Note  Patient: Abigail Delacruz  Procedure(s) Performed: COLONOSCOPY WITH PROPOFOL (N/A Rectum)  Patient location during evaluation: PACU Anesthesia Type: General Level of consciousness: awake and alert and oriented Pain management: satisfactory to patient Vital Signs Assessment: post-procedure vital signs reviewed and stable Respiratory status: spontaneous breathing, nonlabored ventilation and respiratory function stable Cardiovascular status: blood pressure returned to baseline and stable Postop Assessment: Adequate PO intake and No signs of nausea or vomiting Anesthetic complications: no    Raliegh Ip

## 2017-10-21 ENCOUNTER — Encounter: Payer: Self-pay | Admitting: Gastroenterology

## 2017-12-08 DIAGNOSIS — D485 Neoplasm of uncertain behavior of skin: Secondary | ICD-10-CM | POA: Diagnosis not present

## 2017-12-08 DIAGNOSIS — L821 Other seborrheic keratosis: Secondary | ICD-10-CM | POA: Diagnosis not present

## 2017-12-08 DIAGNOSIS — L919 Hypertrophic disorder of the skin, unspecified: Secondary | ICD-10-CM | POA: Diagnosis not present

## 2017-12-23 ENCOUNTER — Ambulatory Visit: Payer: Medicare HMO | Admitting: Nurse Practitioner

## 2018-01-14 DIAGNOSIS — R69 Illness, unspecified: Secondary | ICD-10-CM | POA: Diagnosis not present

## 2018-01-21 ENCOUNTER — Other Ambulatory Visit: Payer: Medicare HMO

## 2018-01-26 ENCOUNTER — Ambulatory Visit: Payer: Medicare HMO | Admitting: Nurse Practitioner

## 2018-02-06 DIAGNOSIS — R69 Illness, unspecified: Secondary | ICD-10-CM | POA: Diagnosis not present

## 2018-02-06 DIAGNOSIS — Z743 Need for continuous supervision: Secondary | ICD-10-CM | POA: Diagnosis not present

## 2018-02-06 DIAGNOSIS — G8911 Acute pain due to trauma: Secondary | ICD-10-CM | POA: Diagnosis not present

## 2018-09-18 ENCOUNTER — Encounter: Payer: Self-pay | Admitting: Nurse Practitioner

## 2018-09-18 ENCOUNTER — Ambulatory Visit (INDEPENDENT_AMBULATORY_CARE_PROVIDER_SITE_OTHER): Payer: Medicare HMO | Admitting: Nurse Practitioner

## 2018-09-18 ENCOUNTER — Other Ambulatory Visit: Payer: Self-pay

## 2018-09-18 VITALS — BP 126/68 | HR 72 | Temp 98.5°F | Ht 61.25 in | Wt 186.2 lb

## 2018-09-18 DIAGNOSIS — Z0001 Encounter for general adult medical examination with abnormal findings: Secondary | ICD-10-CM

## 2018-09-18 DIAGNOSIS — M79671 Pain in right foot: Secondary | ICD-10-CM | POA: Diagnosis not present

## 2018-09-18 DIAGNOSIS — Z1239 Encounter for other screening for malignant neoplasm of breast: Secondary | ICD-10-CM | POA: Diagnosis not present

## 2018-09-18 DIAGNOSIS — Z23 Encounter for immunization: Secondary | ICD-10-CM

## 2018-09-18 DIAGNOSIS — Z1159 Encounter for screening for other viral diseases: Secondary | ICD-10-CM | POA: Diagnosis not present

## 2018-09-18 DIAGNOSIS — Z1329 Encounter for screening for other suspected endocrine disorder: Secondary | ICD-10-CM | POA: Diagnosis not present

## 2018-09-18 DIAGNOSIS — E782 Mixed hyperlipidemia: Secondary | ICD-10-CM | POA: Diagnosis not present

## 2018-09-18 DIAGNOSIS — R635 Abnormal weight gain: Secondary | ICD-10-CM | POA: Diagnosis not present

## 2018-09-18 DIAGNOSIS — Z13228 Encounter for screening for other metabolic disorders: Secondary | ICD-10-CM | POA: Diagnosis not present

## 2018-09-18 DIAGNOSIS — Z130 Encounter for screening for diseases of the blood and blood-forming organs and certain disorders involving the immune mechanism: Secondary | ICD-10-CM

## 2018-09-18 NOTE — Progress Notes (Signed)
Subjective:    Patient ID: Abigail Delacruz, female    DOB: Nov 14, 1950, 68 y.o.   MRN: [id number]  Abigail Delacruz is a 68 y.o. female presenting on 09/18/2018 for Annual Exam, Foot Pain (Right Side, will radiate to calf.), and Cyst (LLQ of abdomen)   HPI Annual Physical Exam Patient has been feeling generally well.  They have acute concerns today. Sleeps well night interrupted.  HEALTH MAINTENANCE: Weight/BMI: continues to gain weight Physical activity: generally sedentary, light activity in pool Diet: eats healthy diet Seatbelt: always Sunscreen: occasionally Mammogram: 2019 DEXA: 2019 Colon Cancer Screen: 2019 HIV: neg in past HEP C: due today Optometry: regular Dentistry: regular  VACCINES: Tetanus: DUE Pneumonia: Needs PPSV23   RIGHT foot pain  Patient notes RIGHT lateral foot pain.  She also notes that when she twists foot, she has pain on Right side of foot with radiation up to calf by afternoon.  This has been present for 2-3 months and is not improving.  Patient generally does not wear supportive footwear and does not wear footwear when walking around her home other than slippers.  Past Medical History:  Diagnosis Date  . Cataract   . Hyperlipidemia    Past Surgical History:  Procedure Laterality Date  . APPENDECTOMY  1971  . CATARACT EXTRACTION W/PHACO Right 09/08/2017   Procedure: CATARACT EXTRACTION PHACO AND INTRAOCULAR LENS PLACEMENT ([place])  RIGHT;  Surgeon: Eulogio Bear, MD;  Location: [place];  Service: Ophthalmology;  Laterality: Right;  . CATARACT EXTRACTION W/PHACO Left 09/29/2017   Procedure: CATARACT EXTRACTION PHACO AND INTRAOCULAR LENS PLACEMENT ([place]) LEFT;  Surgeon: Eulogio Bear, MD;  Location: [place];  Service: Ophthalmology;  Laterality: Left;  . COLONOSCOPY WITH PROPOFOL N/A 10/20/2017   Procedure: COLONOSCOPY WITH PROPOFOL;  Surgeon: Lucilla Lame, MD;  Location: [place];  Service: Endoscopy;   Laterality: N/A;   Social History   Socioeconomic History  . Marital status: Married    Spouse name: Not on file  . Number of children: 2  . Years of education: Not on file  . Highest education level: Some college, no degree  Occupational History  . Not on file  Social Needs  . Financial resource strain: Not on file  . Food insecurity    Worry: Not on file    Inability: Not on file  . Transportation needs    Medical: Not on file    Non-medical: Not on file  Tobacco Use  . Smoking status: Former Smoker    Quit date: 1974    Years since quitting: 46.5  . Smokeless tobacco: Never Used  Substance and Sexual Activity  . Alcohol use: Not Currently    Frequency: Never    Comment: social 1x/yr  . Drug use: Never  . Sexual activity: Not Currently  Lifestyle  . Physical activity    Days per week: Not on file    Minutes per session: Not on file  . Stress: Not on file  Relationships  . Social Herbalist on phone: Not on file    Gets together: Not on file    Attends religious service: Not on file    Active member of club or organization: Not on file    Attends meetings of clubs or organizations: Not on file    Relationship status: Not on file  . Intimate partner violence    Fear of current or ex partner: Not on file    Emotionally abused:  Not on file    Physically abused: Not on file    Forced sexual activity: Not on file  Other Topics Concern  . Not on file  Social History Narrative  . Not on file   Family History  Problem Relation Age of Onset  . Kidney failure Mother   . Cirrhosis Father   . Alcohol abuse Father   . Diabetes Brother   . Hypertension Brother   . Arthritis Brother   . Breast cancer Neg Hx    Current Outpatient Medications on File Prior to Visit  Medication Sig  . atorvastatin (LIPITOR) 20 MG tablet Take 1 tablet (20 mg total) by mouth daily. (Patient not taking: Reported on 09/18/2018)   No current facility-administered medications on  file prior to visit.     Review of Systems  Constitutional: Negative for activity change, appetite change and fatigue.  Respiratory: Negative for cough and shortness of breath.   Cardiovascular: Negative for chest pain, palpitations and leg swelling.  Gastrointestinal: Negative for constipation, diarrhea, nausea and vomiting.  Genitourinary: Negative for dysuria, frequency and urgency.  Musculoskeletal: Negative for arthralgias and myalgias.       RIGHT foot pain  Skin: Negative for rash.  Neurological: Negative for dizziness and headaches.  Psychiatric/Behavioral: Negative for dysphoric mood. The patient is not nervous/anxious.    Per HPI unless specifically indicated above     Objective:    BP 126/68 (BP Location: Left Arm, Patient Position: Sitting, Cuff Size: Normal)   Pulse 72   Temp 98.5 F (36.9 C) (Oral)   Ht 5' 1.25" (1.556 m)   Wt 186 lb 3.2 oz (84.5 kg)   SpO2 97%   BMI 34.90 kg/m   Wt Readings from Last 3 Encounters:  09/18/18 186 lb 3.2 oz (84.5 kg)  10/20/17 178 lb (80.7 kg)  09/29/17 179 lb (81.2 kg)    Physical Exam Vitals signs and nursing note reviewed.  Constitutional:      General: She is not in acute distress.    Appearance: She is well-developed. She is obese.  HENT:     Head: Normocephalic and atraumatic.     Right Ear: Ear canal and external ear normal.     Left Ear: Tympanic membrane, ear canal and external ear normal.     Nose: Nose normal.     Mouth/Throat:     Mouth: Mucous membranes are moist.     Pharynx: Oropharynx is clear.  Eyes:     Conjunctiva/sclera: Conjunctivae normal.     Pupils: Pupils are equal, round, and reactive to light.  Neck:     Musculoskeletal: Normal range of motion and neck supple.     Thyroid: No thyromegaly.     Vascular: No JVD.     Trachea: No tracheal deviation.  Cardiovascular:     Rate and Rhythm: Normal rate and regular rhythm.     Pulses: Normal pulses.     Heart sounds: Normal heart sounds. No  murmur. No friction rub. No gallop.   Pulmonary:     Effort: Pulmonary effort is normal. No respiratory distress.     Breath sounds: Normal breath sounds.  Abdominal:     General: Bowel sounds are normal. There is no distension.     Palpations: Abdomen is soft.     Tenderness: There is no abdominal tenderness.  Musculoskeletal:     Comments: Right foot pain at 5th metatarsal PIP joint with palpation.  No ankle decreased ROM, pain.  Effusion at  RIGHT lateral foot. No ecchymosis or generalized swelling.  Lymphadenopathy:     Cervical: No cervical adenopathy.  Skin:    General: Skin is warm and dry.     Capillary Refill: Capillary refill takes less than 2 seconds.  Neurological:     General: No focal deficit present.     Mental Status: She is alert and oriented to person, place, and time. Mental status is at baseline.     Cranial Nerves: No cranial nerve deficit.  Psychiatric:        Mood and Affect: Mood normal.        Behavior: Behavior normal.        Thought Content: Thought content normal.        Judgment: Judgment normal.      Results for orders placed or performed in visit on 01/21/18  Lipid panel  Result Value Ref Range   Cholesterol 238 (H) <200 mg/dL   HDL 41 (L) > OR = 50 mg/dL   Triglycerides 332 (H) <150 mg/dL   LDL Cholesterol (Calc) 146 (H) mg/dL (calc)   Total CHOL/HDL Ratio 5.8 (H) <5.0 (calc)   Non-HDL Cholesterol (Calc) 197 (H) <130 mg/dL (calc)  COMPLETE METABOLIC PANEL WITH GFR  Result Value Ref Range   Glucose, Bld 92 65 - 99 mg/dL   BUN 15 7 - 25 mg/dL   Creat 0.69 0.50 - 0.99 mg/dL   GFR, Est Non African American 89 > OR = 60 mL/min/1.29m2   GFR, Est African American 104 > OR = 60 mL/min/1.56m2   BUN/Creatinine Ratio NOT APPLICABLE 6 - 22 (calc)   Sodium 141 135 - 146 mmol/L   Potassium 4.6 3.5 - 5.3 mmol/L   Chloride 104 98 - 110 mmol/L   CO2 29 20 - 32 mmol/L   Calcium 9.3 8.6 - 10.4 mg/dL   Total Protein 6.7 6.1 - 8.1 g/dL   Albumin 4.0 3.6 -  5.1 g/dL   Globulin 2.7 1.9 - 3.7 g/dL (calc)   AG Ratio 1.5 1.0 - 2.5 (calc)   Total Bilirubin 0.4 0.2 - 1.2 mg/dL   Alkaline phosphatase (APISO) 121 37 - 153 U/L   AST 23 10 - 35 U/L   ALT 27 6 - 29 U/L      Assessment & Plan:   Problem List Items Addressed This Visit      Other   Hyperlipidemia, mixed   Relevant Orders   COMPLETE METABOLIC PANEL WITH GFR   Lipid panel    Other Visit Diagnoses    Encounter for hepatitis C screening test for low risk patient    -  Primary   Relevant Orders   Hepatitis C antibody   Breast cancer screening       Relevant Orders   MM DIGITAL SCREENING BILATERAL   Need for Streptococcus pneumoniae vaccination       Relevant Orders   Pneumococcal polysaccharide vaccine 23-valent greater than or equal to 2yo subcutaneous/IM (Completed)   Weight gain       Relevant Orders   Hemoglobin A1c   TSH + free T4   Screening for endocrine, metabolic and immunity disorder       Relevant Orders   CBC with Differential/Platelet   Right foot pain       Relevant Orders   DG Foot Complete Right   Ambulatory referral to Podiatry   Encounter for general adult medical examination with abnormal findings  Annual physical exam with new findings of RIGHT lateral foot pain.  Concern for possible stress fracture with non-union.    Plan: 1. Obtain health maintenance screenings as above according to age. - Increase physical activity to 30 minutes most days of the week.  - Eat healthy diet high in vegetables and fruits; low in refined carbohydrates. - Screening labs and tests as ordered 2. Referral to podiatry for further evaluation/treatment and Foot xray today. 3. Return 1 year for annual physical.   Follow up plan: Return in about 1 year (around 09/18/2019) for medicare wellness with nurse health advisor.  [name], DNP, AGPCNP-BC Adult Gerontology Primary Care Nurse Practitioner [facility]  09/18/2018, 9:26 AM

## 2018-09-18 NOTE — Patient Instructions (Addendum)
Abigail Delacruz,   Thank you for coming in to clinic today.  1. Recommendations: - Get your Tetanus shot (TDAP) from your pharmacy.  [facility] [address] [place], [place] [address] Phone: [phone] Fax: [phone]  3. Work on reducing portion sizes for weight loss.  Please schedule a follow-up appointment with [name], AGNP. Return in about 1 year (around 09/18/2019) for medicare wellness with nurse health advisor.  If you have any other questions or concerns, please feel free to call the clinic or send a message through [facility]. You may also schedule an earlier appointment if necessary.  You will receive a survey after today's visit either digitally by e-mail or paper by [employer]. Your experiences and feedback matter to [place].  Please respond so we know how we are doing as we provide care for you.   [name], DNP, AGNP-BC Adult Gerontology Nurse Practitioner [facility]

## 2018-09-21 ENCOUNTER — Encounter: Payer: Self-pay | Admitting: Nurse Practitioner

## 2018-09-21 ENCOUNTER — Other Ambulatory Visit: Payer: Self-pay | Admitting: Nurse Practitioner

## 2018-09-21 DIAGNOSIS — E782 Mixed hyperlipidemia: Secondary | ICD-10-CM

## 2018-09-21 MED ORDER — ATORVASTATIN CALCIUM 20 MG PO TABS: 20.0000 mg | ORAL_TABLET | Freq: Every day | ORAL | 3 refills | Status: DC

## 2018-09-23 ENCOUNTER — Telehealth: Payer: Self-pay

## 2018-09-23 DIAGNOSIS — E782 Mixed hyperlipidemia: Secondary | ICD-10-CM

## 2018-09-23 LAB — COMPLETE METABOLIC PANEL WITH GFR
AG Ratio: 1.5 (calc) (ref 1.0–2.5)
ALT: 27 U/L (ref 6–29)
AST: 23 U/L (ref 10–35)
Albumin: 4 g/dL (ref 3.6–5.1)
Alkaline phosphatase (APISO): 121 U/L (ref 37–153)
BUN: 15 mg/dL (ref 7–25)
CO2: 29 mmol/L (ref 20–32)
Calcium: 9.3 mg/dL (ref 8.6–10.4)
Chloride: 104 mmol/L (ref 98–110)
Creat: 0.69 mg/dL (ref 0.50–0.99)
GFR, Est African American: 104 mL/min/{1.73_m2} (ref >=60)
GFR, Est Non African American: 89 mL/min/{1.73_m2} (ref >=60)
Globulin: 2.7 g/dL (calc) (ref 1.9–3.7)
Glucose, Bld: 92 mg/dL (ref 65–99)
Potassium: 4.6 mmol/L (ref 3.5–5.3)
Sodium: 141 mmol/L (ref 135–146)
Total Bilirubin: 0.4 mg/dL (ref 0.2–1.2)
Total Protein: 6.7 g/dL (ref 6.1–8.1)

## 2018-09-23 LAB — LIPID PANEL
Cholesterol: 238 mg/dL — ABNORMAL HIGH (ref <200)
HDL: 41 mg/dL — ABNORMAL LOW (ref >=50)
LDL Cholesterol (Calc): 146 mg/dL (calc) — ABNORMAL HIGH
Non-HDL Cholesterol (Calc): 197 mg/dL (calc) — ABNORMAL HIGH (ref <130)
Total CHOL/HDL Ratio: 5.8 (calc) — ABNORMAL HIGH (ref <5.0)
Triglycerides: 332 mg/dL — ABNORMAL HIGH (ref <150)

## 2018-09-23 LAB — CBC WITH DIFFERENTIAL/PLATELET

## 2018-09-23 LAB — HEPATITIS C ANTIBODY
Hepatitis C Ab: NONREACTIVE
SIGNAL TO CUT-OFF: 0.02 (ref <1.00)

## 2018-09-23 LAB — TSH+FREE T4
Free T4: 1.2 ng/dL (ref 0.8–1.8)
TSH: 1.59 mIU/L (ref 0.40–4.50)

## 2018-09-23 LAB — HEMOGLOBIN A1C W/OUT EAG

## 2018-09-23 MED ORDER — ATORVASTATIN CALCIUM 20 MG PO TABS: 20.0000 mg | ORAL_TABLET | Freq: Every day | ORAL | 3 refills | Status: DC

## 2018-09-23 NOTE — Telephone Encounter (Signed)
Patients medication was sent to wrong pharm.  Please resend Atorvastatin to [facility] on [address]

## 2018-09-24 ENCOUNTER — Other Ambulatory Visit: Payer: Self-pay

## 2018-09-24 ENCOUNTER — Ambulatory Visit
Admission: RE | Admit: 2018-09-24 | Discharge: 2018-09-24 | Disposition: A | Discharge status: Home / Self Care | Payer: Medicare HMO | Source: Ambulatory Visit | Attending: Nurse Practitioner | Admitting: Nurse Practitioner

## 2018-09-24 DIAGNOSIS — Z1231 Encounter for screening mammogram for malignant neoplasm of breast: Secondary | ICD-10-CM | POA: Insufficient documentation

## 2018-09-24 DIAGNOSIS — Z1239 Encounter for other screening for malignant neoplasm of breast: Secondary | ICD-10-CM

## 2018-09-24 IMAGING — MG DIGITAL SCREENING BILATERAL MAMMOGRAM WITH TOMO AND CAD
8 series · 8 of 24 positions shown · non-contrast
Comparison: Previous exam(s).

CLINICAL DATA: Screening.

EXAM:
DIGITAL SCREENING BILATERAL MAMMOGRAM WITH TOMO AND CAD

[R MLO synth-2D]
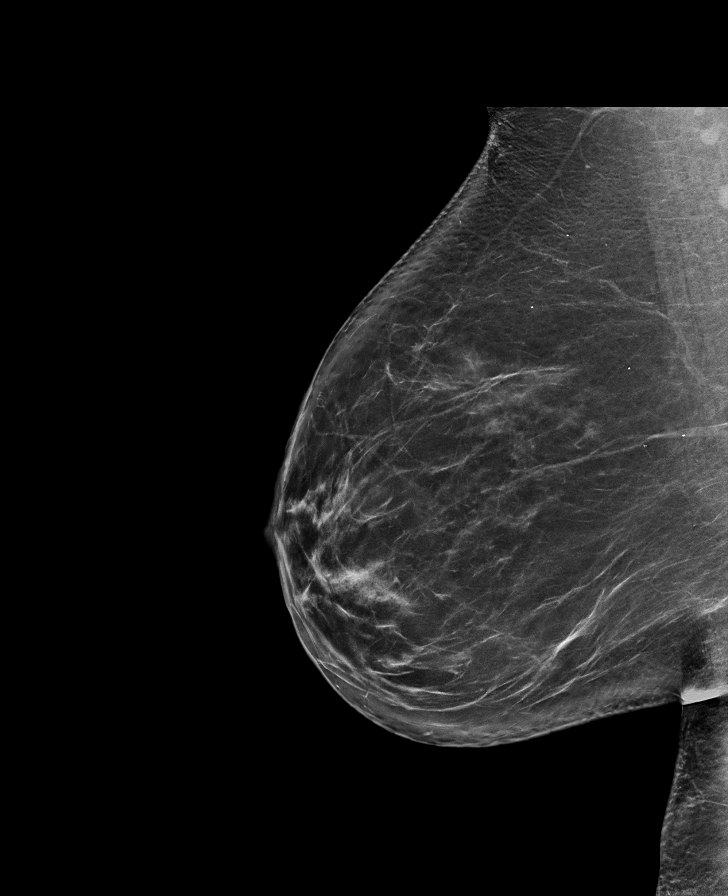

[L CC synth-2D]
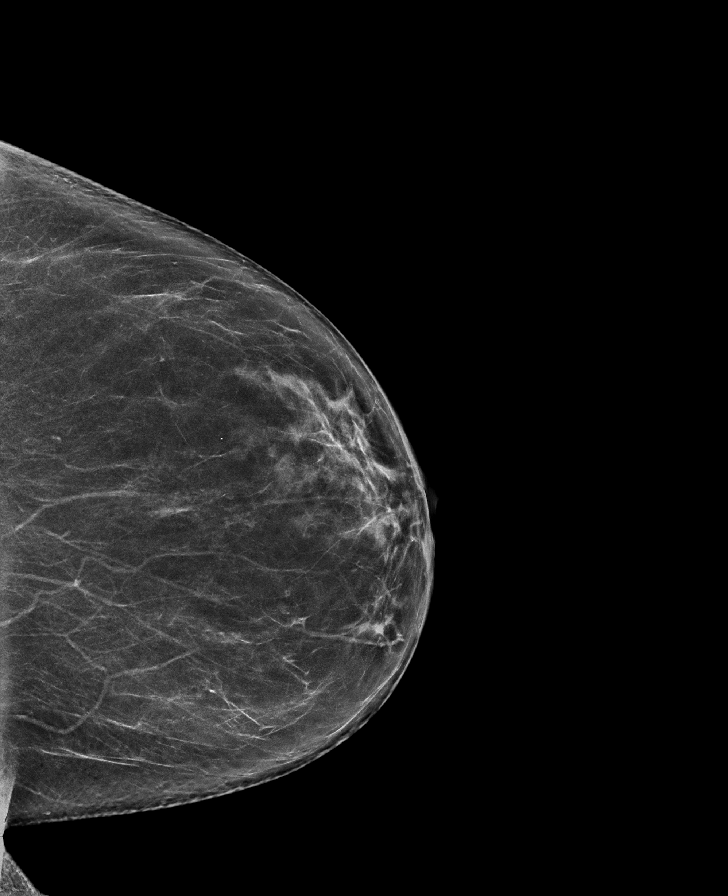

[R CC synth-2D]
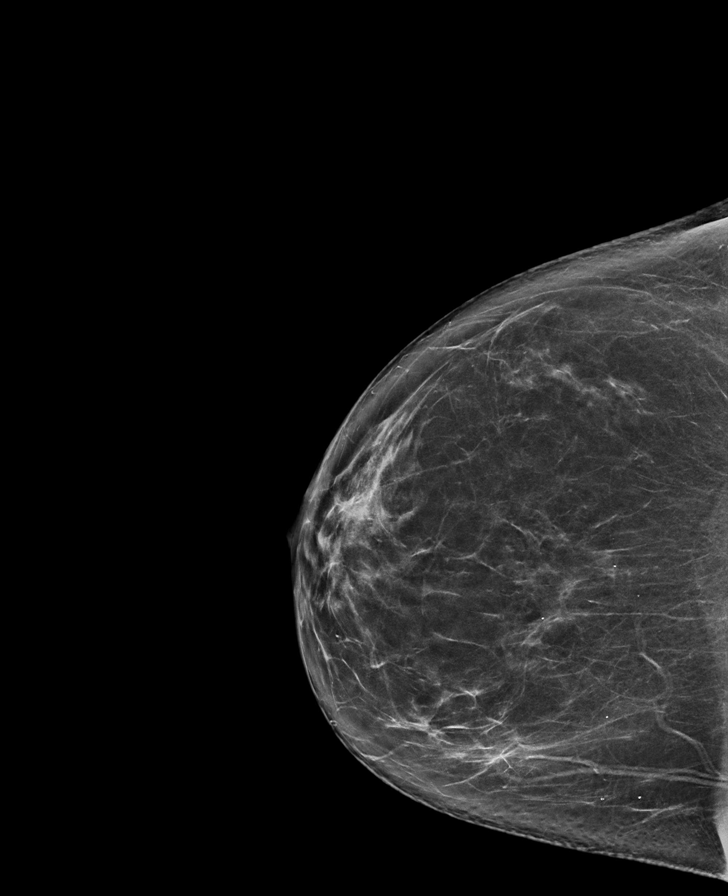

[L MLO synth-2D]
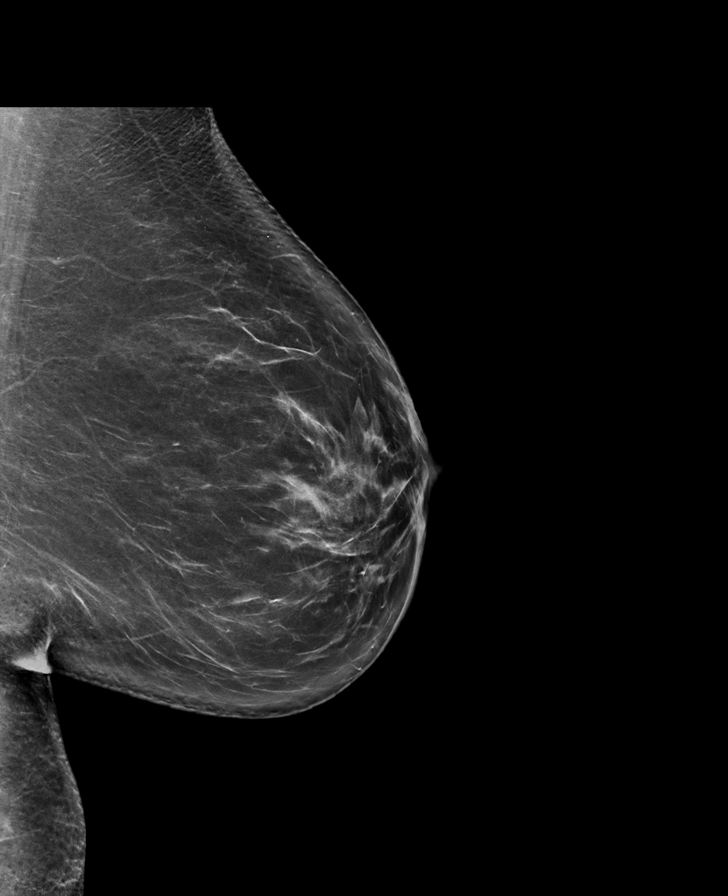

[L MLO tomo · tomo slice 44/87.0]
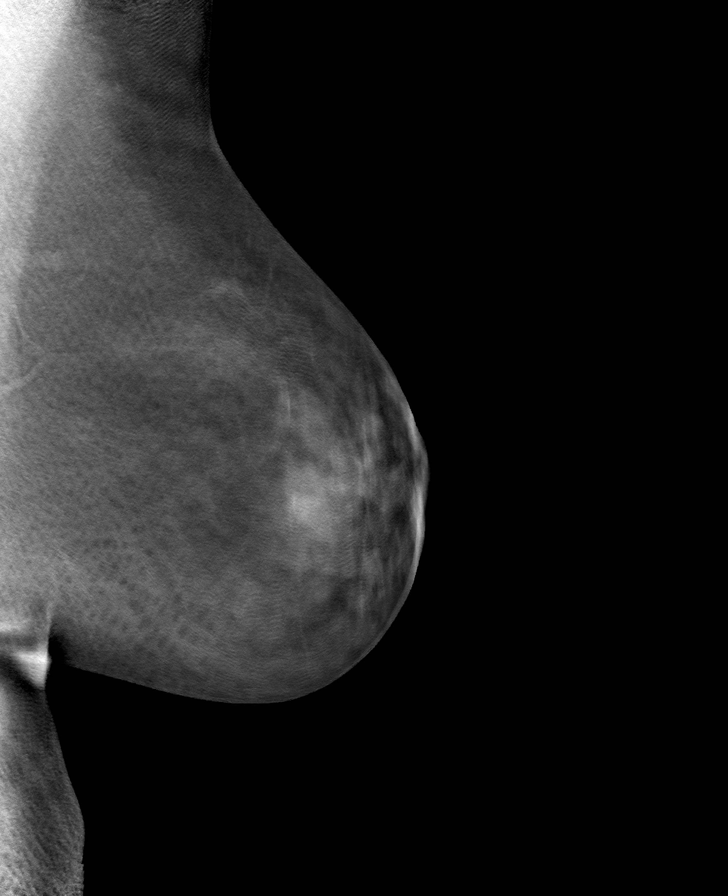

[R MLO tomo · tomo slice 44/87.0]
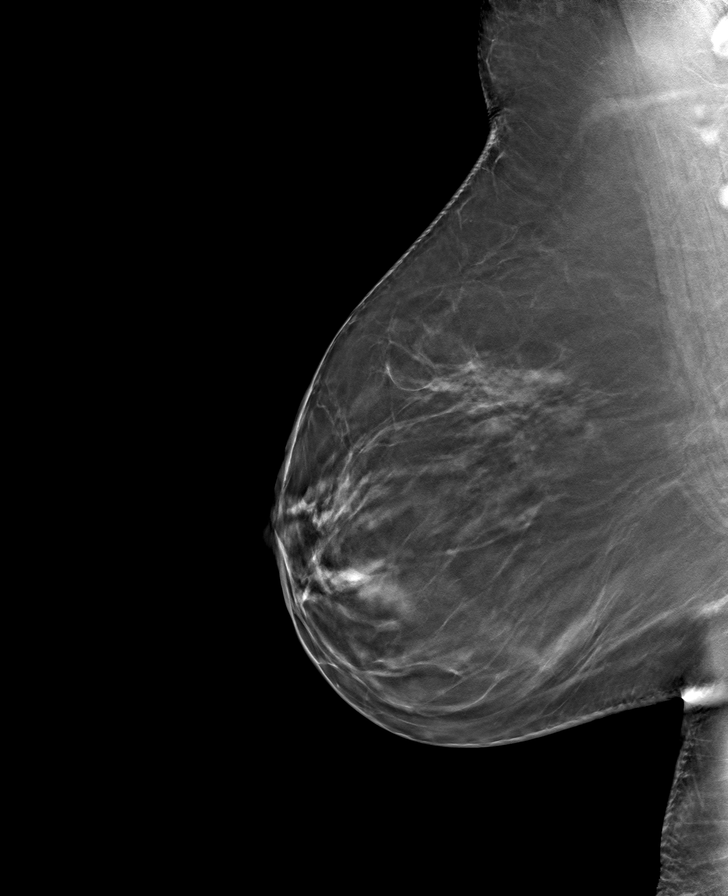

[R CC tomo · tomo slice 39/78.0]
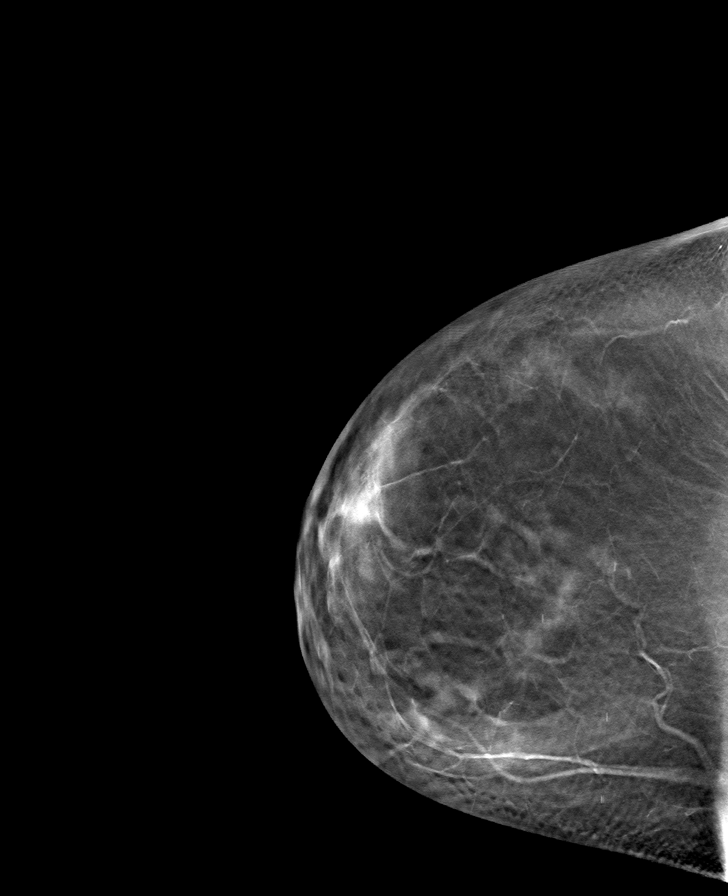

[L CC tomo · tomo slice 37/74.0]
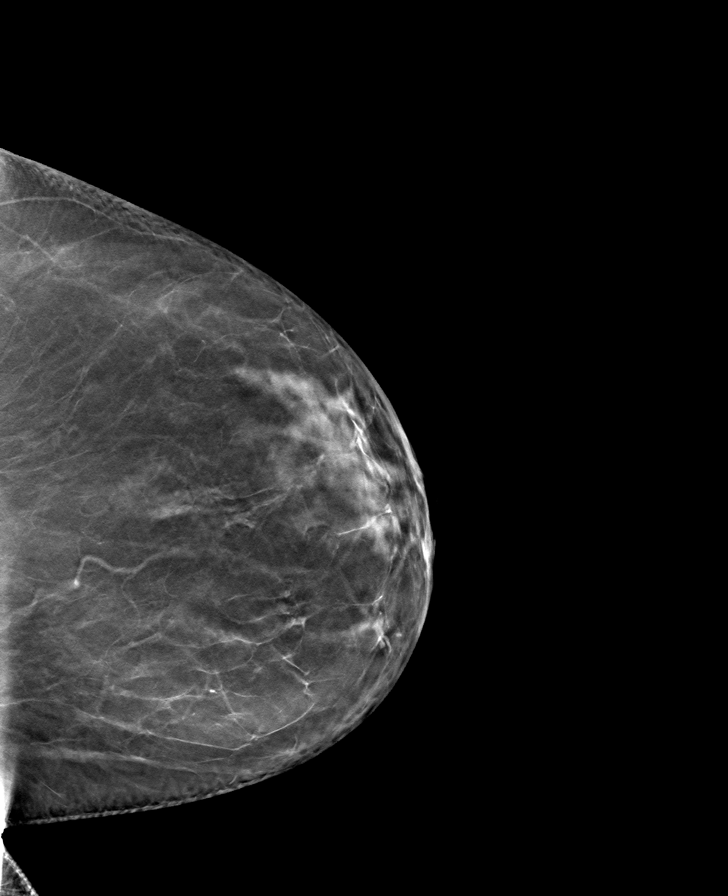

[8 of 24 positions shown; findings below may reference images not displayed]

ACR Breast Density Category b: There are scattered areas of
fibroglandular density.
FINDINGS: There are no findings suspicious for malignancy. Images were
processed with CAD.
IMPRESSION: No mammographic evidence of malignancy. A result letter of this
screening mammogram will be mailed directly to the patient.

RECOMMENDATION:
Screening mammogram in one year. (Code:[TQ])

BI-RADS CATEGORY  1: Negative.

## 2018-09-28 ENCOUNTER — Ambulatory Visit (INDEPENDENT_AMBULATORY_CARE_PROVIDER_SITE_OTHER): Payer: Medicare HMO

## 2018-09-28 ENCOUNTER — Encounter: Payer: Self-pay | Admitting: Podiatry

## 2018-09-28 ENCOUNTER — Ambulatory Visit: Payer: Medicare HMO | Admitting: Podiatry

## 2018-09-28 ENCOUNTER — Other Ambulatory Visit: Payer: Self-pay

## 2018-09-28 ENCOUNTER — Other Ambulatory Visit: Payer: Self-pay | Admitting: Podiatry

## 2018-09-28 VITALS — Temp 97.6°F

## 2018-09-28 DIAGNOSIS — M722 Plantar fascial fibromatosis: Secondary | ICD-10-CM

## 2018-09-28 DIAGNOSIS — M778 Other enthesopathies, not elsewhere classified: Secondary | ICD-10-CM

## 2018-09-28 MED ORDER — MELOXICAM 15 MG PO TABS: 15.0000 mg | ORAL_TABLET | Freq: Every day | ORAL | 3 refills | Status: DC

## 2018-09-28 MED ORDER — METHYLPREDNISOLONE 4 MG PO TBPK: ORAL_TABLET | ORAL | 0 refills | Status: DC

## 2018-09-28 NOTE — Progress Notes (Signed)
Subjective:  Patient ID: Abigail Delacruz, female    DOB: [birth year],  MRN: [id number] HPI Chief Complaint  Patient presents with  . Foot Pain    Patient presents today for left top, lateral foot pain and achilles pain x 1 month.  She states "it feels like a muscle pulling all the time and its worse sometimes in the mornings and by the end of the day.  My doctor said I had some swelling in my foot too"  She has not done anything for treatment    68 y.o. female presents with the above complaint.   ROS: She denies fever chills nausea vomiting muscle aches pains calf pain back pain chest pain shortness of breath.  Past Medical History:  Diagnosis Date  . Cataract   . Hyperlipidemia    Past Surgical History:  Procedure Laterality Date  . APPENDECTOMY  1971  . CATARACT EXTRACTION W/PHACO Right 09/08/2017   Procedure: CATARACT EXTRACTION PHACO AND INTRAOCULAR LENS PLACEMENT ([place])  RIGHT;  Surgeon: Eulogio Bear, MD;  Location: [place];  Service: Ophthalmology;  Laterality: Right;  . CATARACT EXTRACTION W/PHACO Left 09/29/2017   Procedure: CATARACT EXTRACTION PHACO AND INTRAOCULAR LENS PLACEMENT ([place]) LEFT;  Surgeon: Eulogio Bear, MD;  Location: [place];  Service: Ophthalmology;  Laterality: Left;  . COLONOSCOPY WITH PROPOFOL N/A 10/20/2017   Procedure: COLONOSCOPY WITH PROPOFOL;  Surgeon: Lucilla Lame, MD;  Location: [place];  Service: Endoscopy;  Laterality: N/A;    Current Outpatient Medications:  Marland Kitchen  Multiple Vitamins-Minerals (MULTIVITAMIN ADULT) CHEW, Chew by mouth., Disp: , Rfl:  .  atorvastatin (LIPITOR) 20 MG tablet, Take 1 tablet (20 mg total) by mouth daily., Disp: 90 tablet, Rfl: 3 .  meloxicam (MOBIC) 15 MG tablet, Take 1 tablet (15 mg total) by mouth daily., Disp: 30 tablet, Rfl: 3 .  methylPREDNISolone (MEDROL DOSEPAK) 4 MG TBPK tablet, 6 day dose pack - take as directed, Disp: 21 tablet, Rfl: 0  No Known Allergies Review of  Systems Objective:   Vitals:   09/28/18 0829  Temp: 97.6 F (36.4 C)    General: Well developed, nourished, in no acute distress, alert and oriented x3   Dermatological: Skin is warm, dry and supple bilateral. Nails x 10 are well maintained; remaining integument appears unremarkable at this time. There are no open sores, no preulcerative lesions, no rash or signs of infection present.  Vascular: Dorsalis Pedis artery and Posterior Tibial artery pedal pulses are 2/4 bilateral with immedate capillary fill time. Pedal hair growth present. No varicosities and no lower extremity edema present bilateral.   Neruologic: Grossly intact via light touch bilateral. Vibratory intact via tuning fork bilateral. Protective threshold with Semmes Wienstein monofilament intact to all pedal sites bilateral. Patellar and Achilles deep tendon reflexes 2+ bilateral. No Babinski or clonus noted bilateral.   Musculoskeletal: No gross boney pedal deformities bilateral. No pain, crepitus, or limitation noted with foot and ankle range of motion bilateral. Muscular strength 5/5 in all groups tested bilateral.  She has pain on palpation of the fourth and fifth TMT right.  She has pain on frontal plane range of motion at the fourth fifth TMT.  She has pain on palpation medial calcaneal tubercle of the right heel.  Gait: Unassisted, Nonantalgic.    Radiographs:  No acute findings.  She does have soft tissue increase in density at the plantar fascial calcaneal insertion site of her right heel and general swelling overlying the dorsal lateral aspect of  the fifth metatarsal area right.  Assessment & Plan:   Assessment: Plantar fasciitis with lateral compensatory syndrome and Achilles tendinitis most likely compensatory as well.  Plan: Discussed etiology pathology conservative versus surgical therapies.  At this point performed an injection of 20 mg of Kenalog 5 mg Marcaine point of maximal tenderness of the right heel.   Start her on a Medrol Dosepak to be followed by meloxicam placed her plan fascial brace to be followed by a night splint.  Discussed appropriate shoe gear stretching exercises ice therapy and shoe gear modifications.  Follow-up with her in 1 month     Max T. [place], [place]

## 2018-09-28 NOTE — Patient Instructions (Signed)

## 2018-10-28 ENCOUNTER — Encounter: Payer: Self-pay | Admitting: Podiatry

## 2018-10-28 ENCOUNTER — Ambulatory Visit: Payer: Medicare HMO | Admitting: Podiatry

## 2018-10-28 ENCOUNTER — Other Ambulatory Visit: Payer: Self-pay

## 2018-10-28 DIAGNOSIS — M722 Plantar fascial fibromatosis: Secondary | ICD-10-CM | POA: Diagnosis not present

## 2018-10-28 NOTE — Progress Notes (Signed)
She presents today for follow-up of her right heel pain.  States that she has absolutely no pain whatsoever any longer.  Objective: Vital signs are stable alert oriented x3 pulses are palpable no reproducible heel pain.  Assessment: Resolving plantar fasciitis 100%.  Plan: Continue all conservative therapies x1 month.

## 2019-02-17 DIAGNOSIS — Z20828 Contact with and (suspected) exposure to other viral communicable diseases: Secondary | ICD-10-CM | POA: Diagnosis not present

## 2019-05-27 ENCOUNTER — Encounter: Payer: Self-pay | Admitting: Family Medicine

## 2019-05-27 ENCOUNTER — Other Ambulatory Visit: Payer: Self-pay

## 2019-05-27 ENCOUNTER — Ambulatory Visit (INDEPENDENT_AMBULATORY_CARE_PROVIDER_SITE_OTHER): Payer: Medicare HMO | Admitting: Family Medicine

## 2019-05-27 VITALS — BP 126/75 | HR 74 | Temp 97.1°F | Ht 61.25 in | Wt 182.0 lb

## 2019-05-27 DIAGNOSIS — R1010 Upper abdominal pain, unspecified: Secondary | ICD-10-CM | POA: Diagnosis not present

## 2019-05-27 DIAGNOSIS — E782 Mixed hyperlipidemia: Secondary | ICD-10-CM | POA: Diagnosis not present

## 2019-05-27 DIAGNOSIS — K219 Gastro-esophageal reflux disease without esophagitis: Secondary | ICD-10-CM

## 2019-05-27 DIAGNOSIS — R634 Abnormal weight loss: Secondary | ICD-10-CM

## 2019-05-27 DIAGNOSIS — N760 Acute vaginitis: Secondary | ICD-10-CM

## 2019-05-27 DIAGNOSIS — R1013 Epigastric pain: Secondary | ICD-10-CM | POA: Diagnosis not present

## 2019-05-27 DIAGNOSIS — R109 Unspecified abdominal pain: Secondary | ICD-10-CM | POA: Insufficient documentation

## 2019-05-27 LAB — POCT URINALYSIS DIPSTICK
Bilirubin, UA: NEGATIVE
Glucose, UA: NEGATIVE
Ketones, UA: NEGATIVE
Nitrite, UA: NEGATIVE
Protein, UA: NEGATIVE
Spec Grav, UA: >=1.03 — AB (ref 1.010–1.025)
Urobilinogen, UA: 0.2 E.U./dL
pH, UA: 5 (ref 5.0–8.0)

## 2019-05-27 MED ORDER — OMEPRAZOLE 20 MG PO CPDR: 20.0000 mg | DELAYED_RELEASE_CAPSULE | Freq: Every day | ORAL | 3 refills | Status: DC

## 2019-05-27 NOTE — Assessment & Plan Note (Signed)
Status unknown.  Recheck labs.  Continue meds without changes today.  Refills provided. Followup after labs.  

## 2019-05-27 NOTE — Progress Notes (Signed)
Subjective:    Patient ID: Abigail Delacruz, female    DOB: 22-Feb-1951, 69 y.o.   MRN: [id number]  ASTER DAUER is a 69 y.o. female presenting on 05/27/2019 for Abdominal Pain (mid upper abdomen burning sensation x 6 mth. The burning sensation has worsen over the past week. The pt state she took some Tums and notice improvement with the burning sensation.) and Sciatica (Right pain from the buttocks that radiates down the leg )   HPI  [name] presents to clinic for evaluation of upper abdominal pain x 6 months that occurs with and without food intake.  Is able to lay flat without exacerbation of symptoms, denies coughing or metallic taste in mouth.  Reports she had taken Tums and that had given her some relief.  Denies previous history of GERD.  Has been having right sided pain in the buttocks that occasionally goes down her right leg.  Reports that she is able to alleviate the pain when she stretches her lower back and hamstrings.  Denies any numbness, tingling, weakness, change in bowel/bladder function or saddle anesthesia.  Depression screen [facility] 2/9 05/27/2019 08/26/2017 05/23/2017  Decreased Interest 0 0 0  Down, Depressed, Hopeless 0 0 0  PHQ - 2 Score 0 0 0  Altered sleeping - 0 -  Tired, decreased energy - 0 -  Change in appetite - 0 -  Feeling bad or failure about yourself  - 0 -  Trouble concentrating - 0 -  Moving slowly or fidgety/restless - 0 -  Suicidal thoughts - 0 -  PHQ-9 Score - 0 -    Social History   Tobacco Use  . Smoking status: Former Smoker    Quit date: 1974    Years since quitting: 47.2  . Smokeless tobacco: Never Used  Substance Use Topics  . Alcohol use: Yes    Comment: social 1x/yr  . Drug use: Never    Review of Systems  Constitutional: Negative.   HENT: Negative.   Eyes: Negative.   Respiratory: Negative.   Cardiovascular: Negative.   Gastrointestinal: Positive for abdominal pain. Negative for abdominal distention, anal bleeding, blood in  stool, constipation, diarrhea, nausea, rectal pain and vomiting.  Endocrine: Negative.   Genitourinary: Negative.   Musculoskeletal: Positive for back pain. Negative for arthralgias, gait problem, joint swelling, myalgias, neck pain and neck stiffness.  Skin: Negative.   Allergic/Immunologic: Negative.   Neurological: Negative.   Hematological: Negative.   Psychiatric/Behavioral: Negative.    Per HPI unless specifically indicated above     Objective:    BP 126/75 (BP Location: Right Arm, Patient Position: Sitting, Cuff Size: Normal)   Pulse 74   Temp (!) 97.1 F (36.2 C) (Temporal)   Ht 5' 1.25" (1.556 m)   Wt 182 lb (82.6 kg)   BMI 34.11 kg/m   Wt Readings from Last 3 Encounters:  05/27/19 182 lb (82.6 kg)  09/18/18 186 lb 3.2 oz (84.5 kg)  10/20/17 178 lb (80.7 kg)    Physical Exam Vitals reviewed.  Constitutional:      General: She is not in acute distress.    Appearance: Normal appearance. She is well-developed and well-groomed. She is obese. She is not ill-appearing or toxic-appearing.  HENT:     Head: Normocephalic.  Eyes:     General: Lids are normal. Vision grossly intact.        Right eye: No discharge.        Left eye: No discharge.  Extraocular Movements: Extraocular movements intact.     Conjunctiva/sclera: Conjunctivae normal.     Pupils: Pupils are equal, round, and reactive to light.  Cardiovascular:     Rate and Rhythm: Normal rate and regular rhythm.     Pulses: Normal pulses.          Dorsalis pedis pulses are 2+ on the right side and 2+ on the left side.       Posterior tibial pulses are 2+ on the right side and 2+ on the left side.     Heart sounds: Normal heart sounds. No murmur. No friction rub. No gallop.   Pulmonary:     Effort: Pulmonary effort is normal. No respiratory distress.     Breath sounds: Normal breath sounds.  Abdominal:     General: Abdomen is flat. Bowel sounds are normal. There is no distension.     Palpations: Abdomen is  soft.     Tenderness: There is no abdominal tenderness. There is no guarding or rebound.  Musculoskeletal:     Right lower leg: No edema.     Left lower leg: No edema.  Feet:     Right foot:     Skin integrity: Skin integrity normal.     Left foot:     Skin integrity: Skin integrity normal.  Skin:    General: Skin is warm and dry.     Capillary Refill: Capillary refill takes less than 2 seconds.  Neurological:     General: No focal deficit present.     Mental Status: She is alert and oriented to person, place, and time.     Cranial Nerves: No cranial nerve deficit.     Sensory: No sensory deficit.     Motor: No weakness.     Coordination: Coordination normal.     Gait: Gait normal.  Psychiatric:        Attention and Perception: Attention and perception normal.        Mood and Affect: Mood and affect normal.        Speech: Speech normal.        Behavior: Behavior normal. Behavior is cooperative.        Thought Content: Thought content normal.        Cognition and Memory: Cognition and memory normal.        Judgment: Judgment normal.     Results for orders placed or performed in visit on 05/27/19  POCT Urinalysis Dipstick  Result Value Ref Range   Color, UA Dark yellow    Clarity, UA Clear    Glucose, UA Negative Negative   Bilirubin, UA Negative    Ketones, UA Negative    Spec Grav, UA >=1.030 (A) 1.010 - 1.025   Blood, UA Trace    pH, UA 5.0 5.0 - 8.0   Protein, UA Negative Negative   Urobilinogen, UA 0.2 0.2 or 1.0 E.U./dL   Nitrite, UA Negative    Leukocytes, UA Small (1+) (A) Negative   Appearance     Odor        Assessment & Plan:   Problem List Items Addressed This Visit      Other   Hyperlipidemia, mixed    Status unknown.  Recheck labs.  Continue meds without changes today.  Refills provided. Followup after labs.       Relevant Orders   Lipid Profile   CBC with Differential   COMPLETE METABOLIC PANEL WITH GFR   Abdominal pain - Primary  Likely GERD given history of symptoms and has been happening over 6 months.  Will order H. Pylori breath test.  Plan: 1. START Omeprazole 20mg  once daily. Side effects discussed. Pt wants to continue med. 2. Avoid diet triggers. Reviewed need to seek care if globus sensation, difficulty swallowing, s/sx of GI bleed. 3. Follow up in 3-6 months. 4. As discussed, if symptoms begin to get some relief but you feel that you need a higher dosage, let me know and we can change your treatment plan 5. If we have no symptom relief by 6 months, will refer you to gastroenterology for further evaluation.       Relevant Orders   POCT Urinalysis Dipstick (Completed)   H. pylori breath test   CBC with Differential   COMPLETE METABOLIC PANEL WITH GFR    Other Visit Diagnoses    Gastroesophageal reflux disease, unspecified whether esophagitis present       Relevant Medications   omeprazole (PRILOSEC) 20 MG capsule   Weight loss       Relevant Orders   Thyroid Panel With TSH   Acute vaginitis          Meds ordered this encounter  Medications  . omeprazole (PRILOSEC) 20 MG capsule    Sig: Take 1 capsule (20 mg total) by mouth daily.    Dispense:  30 capsule    Refill:  3    Follow up plan: Return in about 6 months (around 11/26/2019) for Physical.   Harlin Rain, [place] Nurse Practitioner [facility] 05/27/2019, 8:49 AM

## 2019-05-27 NOTE — Assessment & Plan Note (Signed)
Likely GERD given history of symptoms and has been happening over 6 months.  Will order H. Pylori breath test.  Plan: 1. START Omeprazole 20mg  once daily. Side effects discussed. Pt wants to continue med. 2. Avoid diet triggers. Reviewed need to seek care if globus sensation, difficulty swallowing, s/sx of GI bleed. 3. Follow up in 3-6 months. 4. As discussed, if symptoms begin to get some relief but you feel that you need a higher dosage, let me know and we can change your treatment plan 5. If we have no symptom relief by 6 months, will refer you to gastroenterology for further evaluation.

## 2019-05-27 NOTE — Patient Instructions (Signed)
As we discussed, have your labs drawn in the next 1-2 weeks and we will contact you with the results.  Begin taking the Omeprazole 20mg  daily as prescribed.  It may take up to 4 days before seeing symptom relief with this medication.  Do not stop this medication abruptly, as it make take 4 days before your symptoms return.  Lifestyle Treatment for Heartburn:   -Avoid eating late at night (approx 2-3 hours before bed) -Avoid increased fluids with meals  -Eat more slowly and chew food thoroughly avoid eating just before exercising   -May benefit from sleeping with an extra pillow or two at night -Weight loss can help with reduction in symptoms  Food Treatment for Heartburn:   -Avoid ice cold drinks  -Increase vegetables  -Avoid caffeine and sugar  -Increase probiotic foods  -Avoid citrus, tomatoes, peppers, beans, peanuts, zucchini, and other night shades for 21 days.  Supplemental Treatment to Consider for Heartburn:  -Can try over the counter Tums, Mylanta and/or Alka Seltzer, according to packaging directions as needed.    - Can check out [facility] and Dr. Denice Paradise on YouTube for help with sciatica pain stretches.  If you find that you are not relieving your pain with these stretches/exercises, to follow up with me and we will put in your referral to physical therapy.  We will plan to see you back in 6 months for your physical  You will receive a survey after today's visit either digitally by e-mail or paper by [facility] mail. Your experiences and feedback matter to [place].  Please respond so we know how we are doing as we provide care for you.  Call us with any questions/concerns/needs.  It is my goal to be available to you for your health concerns.  Thanks for choosing me to be a partner in your healthcare needs!  Harlin Rain, FNP-C Family Nurse Practitioner [facility] Phone: [phone]

## 2019-05-28 LAB — LIPID PANEL
Cholesterol: 138 mg/dL (ref <200)
HDL: 53 mg/dL (ref >=50)
LDL Cholesterol (Calc): 66 mg/dL (calc)
Non-HDL Cholesterol (Calc): 85 mg/dL (calc) (ref <130)
Total CHOL/HDL Ratio: 2.6 (calc) (ref <5.0)
Triglycerides: 106 mg/dL (ref <150)

## 2019-05-28 LAB — CBC WITH DIFFERENTIAL/PLATELET
Absolute Monocytes: 338 cells/uL (ref 200–950)
Basophils Absolute: 42 cells/uL (ref 0–200)
Basophils Relative: 0.8 %
Eosinophils Absolute: 161 cells/uL (ref 15–500)
Eosinophils Relative: 3.1 %
HCT: 43.2 % (ref 35.0–45.0)
Hemoglobin: 14.5 g/dL (ref 11.7–15.5)
Lymphs Abs: 1622 cells/uL (ref 850–3900)
MCH: 31.4 pg (ref 27.0–33.0)
MCHC: 33.6 g/dL (ref 32.0–36.0)
MCV: 93.5 fL (ref 80.0–100.0)
MPV: 11.6 fL (ref 7.5–12.5)
Monocytes Relative: 6.5 %
Neutro Abs: 3037 cells/uL (ref 1500–7800)
Neutrophils Relative %: 58.4 %
Platelets: 242 10*3/uL (ref 140–400)
RBC: 4.62 10*6/uL (ref 3.80–5.10)
RDW: 13.1 % (ref 11.0–15.0)
Total Lymphocyte: 31.2 %
WBC: 5.2 10*3/uL (ref 3.8–10.8)

## 2019-05-28 LAB — THYROID PANEL WITH TSH
Free Thyroxine Index: 2.9 (ref 1.4–3.8)
T3 Uptake: 29 % (ref 22–35)
T4, Total: 9.9 ug/dL (ref 5.1–11.9)
TSH: 1.49 mIU/L (ref 0.40–4.50)

## 2019-05-28 LAB — COMPLETE METABOLIC PANEL WITH GFR
AG Ratio: 1.6 (calc) (ref 1.0–2.5)
ALT: 30 U/L — ABNORMAL HIGH (ref 6–29)
AST: 23 U/L (ref 10–35)
Albumin: 4.3 g/dL (ref 3.6–5.1)
Alkaline phosphatase (APISO): 120 U/L (ref 37–153)
BUN: 22 mg/dL (ref 7–25)
CO2: 30 mmol/L (ref 20–32)
Calcium: 9.5 mg/dL (ref 8.6–10.4)
Chloride: 105 mmol/L (ref 98–110)
Creat: 0.66 mg/dL (ref 0.50–0.99)
GFR, Est African American: 104 mL/min/{1.73_m2} (ref >=60)
GFR, Est Non African American: 90 mL/min/{1.73_m2} (ref >=60)
Globulin: 2.7 g/dL (calc) (ref 1.9–3.7)
Glucose, Bld: 99 mg/dL (ref 65–99)
Potassium: 4.4 mmol/L (ref 3.5–5.3)
Sodium: 140 mmol/L (ref 135–146)
Total Bilirubin: 0.5 mg/dL (ref 0.2–1.2)
Total Protein: 7 g/dL (ref 6.1–8.1)

## 2019-05-28 LAB — H. PYLORI BREATH TEST: H. pylori Breath Test: DETECTED — AB

## 2019-05-31 ENCOUNTER — Telehealth: Payer: Self-pay | Admitting: Family Medicine

## 2019-05-31 ENCOUNTER — Other Ambulatory Visit: Payer: Self-pay | Admitting: Family Medicine

## 2019-05-31 ENCOUNTER — Telehealth: Payer: Self-pay

## 2019-05-31 DIAGNOSIS — A048 Other specified bacterial intestinal infections: Secondary | ICD-10-CM

## 2019-05-31 MED ORDER — CLARITHROMYCIN 500 MG PO TABS: 500.0000 mg | ORAL_TABLET | Freq: Two times a day (BID) | ORAL | 0 refills | Status: DC

## 2019-05-31 MED ORDER — OMEPRAZOLE 20 MG PO CPDR: 20.0000 mg | DELAYED_RELEASE_CAPSULE | Freq: Two times a day (BID) | ORAL | 0 refills | Status: DC

## 2019-05-31 MED ORDER — METRONIDAZOLE 500 MG PO TABS: 500.0000 mg | ORAL_TABLET | Freq: Three times a day (TID) | ORAL | 0 refills | Status: DC

## 2019-05-31 NOTE — Telephone Encounter (Signed)
Patient called and requested for medications that was sent to [facility] today to be sent to [facility].

## 2019-05-31 NOTE — Progress Notes (Signed)
Labs within normal limits except H. Pylori is detected.  Likely source of upper GI pain.  Will treat with Clarithromycin triple (Omeprazole 20mg  PO BID x 14 days, Clarithromycin 500mg  PO BID x 14 days, Metronidazole 500mg  PO TID x 14 days).

## 2019-05-31 NOTE — Telephone Encounter (Signed)
Call back from Abigail Delacruz, reviewed her labs results and treatment plan.  Abigail Delacruz verbalized understanding and denied any additional questions/concerns/needs.

## 2019-06-01 ENCOUNTER — Other Ambulatory Visit: Payer: Self-pay

## 2019-06-01 DIAGNOSIS — E782 Mixed hyperlipidemia: Secondary | ICD-10-CM

## 2019-06-01 DIAGNOSIS — A048 Other specified bacterial intestinal infections: Secondary | ICD-10-CM

## 2019-06-01 MED ORDER — METRONIDAZOLE 500 MG PO TABS: 500.0000 mg | ORAL_TABLET | Freq: Three times a day (TID) | ORAL | 0 refills | Status: AC

## 2019-06-01 MED ORDER — CLARITHROMYCIN 500 MG PO TABS: 500.0000 mg | ORAL_TABLET | Freq: Two times a day (BID) | ORAL | 0 refills | Status: AC

## 2019-06-14 ENCOUNTER — Telehealth: Payer: Self-pay | Admitting: Family Medicine

## 2019-06-14 NOTE — Telephone Encounter (Signed)
The pt was notified that she can stop the Omeprazole as well. She verbalize understanding, no questions or concern.

## 2019-06-14 NOTE — Telephone Encounter (Signed)
As long as she has finished her 14 days of medications, she is good to stop the Omeprazole as well.  Thanks

## 2019-06-14 NOTE — Telephone Encounter (Signed)
Pt would like to know if she is to continue taking omeprazole (PRILOSEC) 20 MG capsule Stated she finishes Flagyl and Biaxin today as well as Omeprazole and feels 100% better. Please advise.

## 2019-07-27 ENCOUNTER — Ambulatory Visit (INDEPENDENT_AMBULATORY_CARE_PROVIDER_SITE_OTHER): Payer: Medicare HMO

## 2019-07-27 ENCOUNTER — Other Ambulatory Visit: Payer: Self-pay

## 2019-07-27 VITALS — BP 137/73 | HR 66 | Temp 98.2°F | Resp 15 | Ht 61.25 in | Wt 182.8 lb

## 2019-07-27 DIAGNOSIS — Z0000 Encounter for general adult medical examination without abnormal findings: Secondary | ICD-10-CM

## 2019-07-27 NOTE — Patient Instructions (Signed)
Abigail Delacruz , Thank you for taking time to come for your Medicare Wellness Visit. I appreciate your ongoing commitment to your health goals. Please review the following plan we discussed and let me know if I can assist you in the future.   Screening recommendations/referrals: Colonoscopy: completed 10/20/2017 Mammogram: completed 09/24/2018, due 09/24/2018 Please call [phone] to schedule your mammogram.  Bone Density: completed 09/22/2017 Recommended yearly ophthalmology/optometry visit for glaucoma screening and checkup Recommended yearly dental visit for hygiene and checkup  Vaccinations: Influenza vaccine: due 10/2019 Pneumococcal vaccine: completed series  Tdap vaccine: due, check with your insurance for coverage  Shingles vaccine: shingrix eligible, check with your insurance for coverage    Covid-19:completed   Advanced directives: Advance directive discussed with you today. I have provided a copy for you to complete at home and have notarized. Once this is complete please bring a copy in to our office so we can scan it into your chart.  Conditions/risks identified: Recommend walking for 30 minutes 3x a day  Next appointment: Follow up in one year for your annual wellness visit.    Preventive Care 35 Years and Older, Female Preventive care refers to lifestyle choices and visits with your health care provider that can promote health and wellness. What does preventive care include?  A yearly physical exam. This is also called an annual well check.  Dental exams once or twice a year.  Routine eye exams. Ask your health care provider how often you should have your eyes checked.  Personal lifestyle choices, including:  Daily care of your teeth and gums.  Regular physical activity.  Eating a healthy diet.  Avoiding tobacco and drug use.  Limiting alcohol use.  Practicing safe sex.  Taking low-dose aspirin every day.  Taking vitamin and mineral supplements as  recommended by your health care provider. What happens during an annual well check? The services and screenings done by your health care provider during your annual well check will depend on your age, overall health, lifestyle risk factors, and family history of disease. Counseling  Your health care provider may ask you questions about your:  Alcohol use.  Tobacco use.  Drug use.  Emotional well-being.  Home and relationship well-being.  Sexual activity.  Eating habits.  History of falls.  Memory and ability to understand (cognition).  Work and work Statistician.  Reproductive health. Screening  You may have the following tests or measurements:  Height, weight, and BMI.  Blood pressure.  Lipid and cholesterol levels. These may be checked every 5 years, or more frequently if you are over 49 years old.  Skin check.  Lung cancer screening. You may have this screening every year starting at age 67 if you have a 30-pack-year history of smoking and currently smoke or have quit within the past 15 years.  Fecal occult blood test (FOBT) of the stool. You may have this test every year starting at age 38.  Flexible sigmoidoscopy or colonoscopy. You may have a sigmoidoscopy every 5 years or a colonoscopy every 10 years starting at age 9.  Hepatitis C blood test.  Hepatitis B blood test.  Sexually transmitted disease (STD) testing.  Diabetes screening. This is done by checking your blood sugar (glucose) after you have not eaten for a while (fasting). You may have this done every 1-3 years.  Bone density scan. This is done to screen for osteoporosis. You may have this done starting at age 28.  Mammogram. This may be done every 1-2 years.  Talk to your health care provider about how often you should have regular mammograms. Talk with your health care provider about your test results, treatment options, and if necessary, the need for more tests. Vaccines  Your health care  provider may recommend certain vaccines, such as:  Influenza vaccine. This is recommended every year.  Tetanus, diphtheria, and acellular pertussis (Tdap, Td) vaccine. You may need a Td booster every 10 years.  Zoster vaccine. You may need this after age 23.  Pneumococcal 13-valent conjugate (PCV13) vaccine. One dose is recommended after age 90.  Pneumococcal polysaccharide (PPSV23) vaccine. One dose is recommended after age 50. Talk to your health care provider about which screenings and vaccines you need and how often you need them. This information is not intended to replace advice given to you by your health care provider. Make sure you discuss any questions you have with your health care provider. Document Released: 03/10/2015 Document Revised: 11/01/2015 Document Reviewed: 12/13/2014 Elsevier Interactive Patient Education  2017 [place] Prevention in the Home Falls can cause injuries. They can happen to people of all ages. There are many things you can do to make your home safe and to help prevent falls. What can I do on the outside of my home?  Regularly fix the edges of walkways and driveways and fix any cracks.  Remove anything that might make you trip as you walk through a door, such as a raised step or threshold.  Trim any bushes or trees on the path to your home.  Use bright outdoor lighting.  Clear any walking paths of anything that might make someone trip, such as rocks or tools.  Regularly check to see if handrails are loose or broken. Make sure that both sides of any steps have handrails.  Any raised decks and porches should have guardrails on the edges.  Have any leaves, snow, or ice cleared regularly.  Use sand or salt on walking paths during winter.  Clean up any spills in your garage right away. This includes oil or grease spills. What can I do in the bathroom?  Use night lights.  Install grab bars by the toilet and in the tub and shower. Do  not use towel bars as grab bars.  Use non-skid mats or decals in the tub or shower.  If you need to sit down in the shower, use a plastic, non-slip stool.  Keep the floor dry. Clean up any water that spills on the floor as soon as it happens.  Remove soap buildup in the tub or shower regularly.  Attach bath mats securely with double-sided non-slip rug tape.  Do not have throw rugs and other things on the floor that can make you trip. What can I do in the bedroom?  Use night lights.  Make sure that you have a light by your bed that is easy to reach.  Do not use any sheets or blankets that are too big for your bed. They should not hang down onto the floor.  Have a firm chair that has side arms. You can use this for support while you get dressed.  Do not have throw rugs and other things on the floor that can make you trip. What can I do in the kitchen?  Clean up any spills right away.  Avoid walking on wet floors.  Keep items that you use a lot in easy-to-reach places.  If you need to reach something above you, use a strong step stool  that has a grab bar.  Keep electrical cords out of the way.  Do not use floor polish or wax that makes floors slippery. If you must use wax, use non-skid floor wax.  Do not have throw rugs and other things on the floor that can make you trip. What can I do with my stairs?  Do not leave any items on the stairs.  Make sure that there are handrails on both sides of the stairs and use them. Fix handrails that are broken or loose. Make sure that handrails are as long as the stairways.  Check any carpeting to make sure that it is firmly attached to the stairs. Fix any carpet that is loose or worn.  Avoid having throw rugs at the top or bottom of the stairs. If you do have throw rugs, attach them to the floor with carpet tape.  Make sure that you have a light switch at the top of the stairs and the bottom of the stairs. If you do not have them,  ask someone to add them for you. What else can I do to help prevent falls?  Wear shoes that:  Do not have high heels.  Have rubber bottoms.  Are comfortable and fit you well.  Are closed at the toe. Do not wear sandals.  If you use a stepladder:  Make sure that it is fully opened. Do not climb a closed stepladder.  Make sure that both sides of the stepladder are locked into place.  Ask someone to hold it for you, if possible.  Clearly mark and make sure that you can see:  Any grab bars or handrails.  First and last steps.  Where the edge of each step is.  Use tools that help you move around (mobility aids) if they are needed. These include:  Canes.  Walkers.  Scooters.  Crutches.  Turn on the lights when you go into a dark area. Replace any light bulbs as soon as they burn out.  Set up your furniture so you have a clear path. Avoid moving your furniture around.  If any of your floors are uneven, fix them.  If there are any pets around you, be aware of where they are.  Review your medicines with your doctor. Some medicines can make you feel dizzy. This can increase your chance of falling. Ask your doctor what other things that you can do to help prevent falls. This information is not intended to replace advice given to you by your health care provider. Make sure you discuss any questions you have with your health care provider. Document Released: 12/08/2008 Document Revised: 07/20/2015 Document Reviewed: 03/18/2014 Elsevier Interactive Patient Education  2017 [employer].

## 2019-07-27 NOTE — Progress Notes (Signed)
Subjective:   Abigail Delacruz is a 69 y.o. female who presents for Medicare Annual (Subsequent) preventive examination.  Review of Systems:   Cardiac Risk Factors include: advanced age (>36men, >6 women);dyslipidemia     Objective:     Vitals: BP 137/73 (BP Location: Left Arm, Patient Position: Sitting, Cuff Size: Normal)   Pulse 66   Temp 98.2 F (36.8 C) (Temporal)   Resp 15   Ht 5' 1.25" (1.556 m)   Wt 182 lb 12.8 oz (82.9 kg)   BMI 34.26 kg/m   Body mass index is 34.26 kg/m.  Advanced Directives 07/27/2019 10/20/2017 09/29/2017 09/08/2017  Does Patient Have a Medical Advance Directive? No No Yes No  Type of Advance Directive - - Healthcare Power of Attorney -  Would patient like information on creating a medical advance directive? Yes (MAU/Ambulatory/Procedural Areas - Information given) No - Patient declined No - Patient declined No - Patient declined    Tobacco Social History   Tobacco Use  Smoking Status Former Smoker  . Quit date: 43  . Years since quitting: 47.4  Smokeless Tobacco Never Used     Counseling given: Not Answered   Clinical Intake:  Pre-visit preparation completed: Yes  Pain : 0-10 Pain Score: 9  Pain Type: Chronic pain Pain Location: Finger (Comment which one)(left middle finger) Pain Orientation: Left Pain Descriptors / Indicators: Sore Pain Onset: More than a month ago Pain Frequency: Constant     Nutritional Status: BMI > 30  Obese Nutritional Risks: None Diabetes: No  How often do you need to have someone help you when you read instructions, pamphlets, or other written materials from your doctor or pharmacy?: 1 - Never  Interpreter Needed?: No  Information entered by :: Dequann Vandervelden,LPN  Past Medical History:  Diagnosis Date  . Cataract   . Hyperlipidemia    Past Surgical History:  Procedure Laterality Date  . APPENDECTOMY  1971  . CATARACT EXTRACTION W/PHACO Right 09/08/2017   Procedure: CATARACT EXTRACTION PHACO  AND INTRAOCULAR LENS PLACEMENT ([facility])  RIGHT;  Surgeon: Eulogio Bear, MD;  Location: [place];  Service: Ophthalmology;  Laterality: Right;  . CATARACT EXTRACTION W/PHACO Left 09/29/2017   Procedure: CATARACT EXTRACTION PHACO AND INTRAOCULAR LENS PLACEMENT ([place]) LEFT;  Surgeon: Eulogio Bear, MD;  Location: [place];  Service: Ophthalmology;  Laterality: Left;  . COLONOSCOPY WITH PROPOFOL N/A 10/20/2017   Procedure: COLONOSCOPY WITH PROPOFOL;  Surgeon: Lucilla Lame, MD;  Location: [place];  Service: Endoscopy;  Laterality: N/A;   Family History  Problem Relation Age of Onset  . Kidney failure Mother   . Cirrhosis Father   . Alcohol abuse Father   . Diabetes Brother   . Hypertension Brother   . Arthritis Brother   . Breast cancer Neg Hx    Social History   Socioeconomic History  . Marital status: Married    Spouse name: Not on file  . Number of children: 2  . Years of education: Not on file  . Highest education level: Some college, no degree  Occupational History  . Not on file  Tobacco Use  . Smoking status: Former Smoker    Quit date: 1974    Years since quitting: 47.4  . Smokeless tobacco: Never Used  Substance and Sexual Activity  . Alcohol use: Yes    Comment: social 1x/yr  . Drug use: Never  . Sexual activity: Not Currently  Other Topics Concern  . Not on file  Social History Narrative  . Not on file   Social Determinants of Health   Financial Resource Strain: Low Risk   . Difficulty of Paying Living Expenses: Not hard at all  Food Insecurity: No Food Insecurity  . Worried About Charity fundraiser in the Last Year: Never true  . Ran Out of Food in the Last Year: Never true  Transportation Needs: No Transportation Needs  . Lack of Transportation (Medical): No  . Lack of Transportation (Non-Medical): No  Physical Activity: Inactive  . Days of Exercise per Week: 0 days  . Minutes of Exercise per Session: 0 min  Stress:    . Feeling of Stress :   Social Connections: Not Isolated  . Frequency of Communication with Friends and Family: More than three times a week  . Frequency of Social Gatherings with Friends and Family: More than three times a week  . Attends Religious Services: More than 4 times per year  . Active Member of Clubs or Organizations: Yes  . Attends Archivist Meetings: More than 4 times per year  . Marital Status: Married    Outpatient Encounter Medications as of 07/27/2019  Medication Sig  . atorvastatin (LIPITOR) 20 MG tablet Take 1 tablet (20 mg total) by mouth daily.  . Multiple Vitamins-Minerals (MULTIVITAMIN ADULT) CHEW Chew by mouth.  Marland Kitchen omeprazole (PRILOSEC) 20 MG capsule Take 1 capsule (20 mg total) by mouth 2 (two) times daily before a meal for 14 days. (Patient taking differently: Take 20 mg by mouth daily as needed. )   No facility-administered encounter medications on file as of 07/27/2019.    Activities of Daily Living In your present state of health, do you have any difficulty performing the following activities: 07/27/2019 05/27/2019  Hearing? N N  Comment no hearing aids -  Vision? N Y  Comment reading glasses, Dr.King -  Difficulty concentrating or making decisions? Y N  Comment comes back -  Walking or climbing stairs? N N  Dressing or bathing? N N  Doing errands, shopping? N N  Preparing Food and eating ? N -  Using the Toilet? N -  In the past six months, have you accidently leaked urine? N -  Do you have problems with loss of bowel control? N -  Managing your Medications? N -  Managing your Finances? N -  Housekeeping or managing your Housekeeping? N -  Some recent data might be hidden    Patient Care Team: Malfi, Lupita Raider, FNP as PCP - General (Family Medicine)    Assessment:   This is a routine wellness examination for Abigail Delacruz.  Exercise Activities and Dietary recommendations Current Exercise Habits: The patient does not participate in regular  exercise at present, Exercise limited by: None identified  Goals Addressed            This Visit's Progress   . Exercise 3x per week (30 min per time)       Recommend walking for 30 minutes 3x a day       Fall Risk: Fall Risk  07/27/2019 09/18/2018 08/26/2017 05/23/2017  Falls in the past year? 0 0 No No  Number falls in past yr: 0 - - -  Injury with Fall? 0 - - -  Follow up - Falls evaluation completed - -    FALL RISK PREVENTION PERTAINING TO THE HOME:  Any stairs in or around the home? Yes  steps going in home  If so, are there any without  handrails? No   Home free of loose throw rugs in walkways, pet beds, electrical cords, etc? Yes  Adequate lighting in your home to reduce risk of falls? Yes   ASSISTIVE DEVICES UTILIZED TO PREVENT FALLS:  Life alert? No  Use of a cane, walker or w/c? No  Grab bars in the bathroom? No  Shower chair or bench in shower? No  Elevated toilet seat or a handicapped toilet? No   DME ORDERS:  DME order needed?  No   TIMED UP AND GO:  Was the test performed? Yes .  Length of time to ambulate 10 feet: 8 sec.   GAIT:  Appearance of gait: Gait steady and fast without the use of an assistive device.  Education: Fall risk prevention has been discussed.  Intervention(s) required? No   DME/home health order needed?  No    Depression Screen PHQ 2/9 Scores 07/27/2019 05/27/2019 08/26/2017 05/23/2017  PHQ - 2 Score 0 0 0 0  PHQ- 9 Score - - 0 -     Cognitive Function     6CIT Screen 07/27/2019  What Year? 0 points  What month? 0 points  What time? 0 points  Count back from 20 0 points  Months in reverse 0 points  Repeat phrase 0 points  Total Score 0    Immunization History  Administered Date(s) Administered  . PFIZER SARS-COV-2 Vaccination 06/01/2019, 06/22/2019  . Pneumococcal Conjugate-13 08/26/2017  . Pneumococcal Polysaccharide-23 09/18/2018    Qualifies for Shingles Vaccine? Yes  Zostavax completed n/a. Due for Shingrix.  Education has been provided regarding the importance of this vaccine. Pt has been advised to call insurance company to determine out of pocket expense. Advised may also receive vaccine at local pharmacy or Health Dept. Verbalized acceptance and understanding.  Tdap: Although this vaccine is not a covered service during a Wellness Exam, does the patient still wish to receive this vaccine today?  Yes .  Education has been provided regarding the importance of this vaccine. Advised may receive this vaccine at local pharmacy or Health Dept. Aware to provide a copy of the vaccination record if obtained from local pharmacy or Health Dept. Verbalized acceptance and understanding.  Flu Vaccine: declined, due 10/2019  Pneumococcal Vaccine: completed series   Covid-19 Vaccine: Completed vaccines  Screening Tests Health Maintenance  Topic Date Due  . TETANUS/TDAP  07/26/2020 (Originally 04/05/1969)  . INFLUENZA VACCINE  09/26/2019  . MAMMOGRAM  09/23/2020  . COLONOSCOPY  10/21/2027  . DEXA SCAN  Completed  . COVID-19 Vaccine  Completed  . Hepatitis C Screening  Completed  . PNA vac Low Risk Adult  Completed    Cancer Screenings:  Colorectal Screening: Completed 10/20/2017. Repeat every 10 years  Mammogram: Completed 09/24/2018. Repeat every year;  Ordered   Bone Density: Completed 09/22/2017.   Lung Cancer Screening: (Low Dose CT Chest recommended if Age 74-80 years, 30 pack-year currently smoking OR have quit w/in 15years.) does not qualify.    Additional Screening:  Hepatitis C Screening: does qualify; Completed 09/18/2018  Vision Screening: Recommended annual ophthalmology exams for early detection of glaucoma and other disorders of the eye. Is the patient up to date with their annual eye exam?  Yes  Who is the provider or what is the name of the office in which the pt attends annual eye exams? Dr.King   Dental Screening: Recommended annual dental exams for proper oral hygiene  Community  Resource Referral:  CRR required this visit?  no  Plan:  I have personally reviewed and addressed the Medicare Annual Wellness questionnaire and have noted the following in the patient's chart:  A. Medical and social history B. Use of alcohol, tobacco or illicit drugs  C. Current medications and supplements D. Functional ability and status E.  Nutritional status F.  Physical activity G. Advance directives H. List of other physicians I.  Hospitalizations, surgeries, and ER visits in previous 12 months J.  [place] such as hearing and vision if needed, cognitive and depression L. Referrals and appointments   In addition, I have reviewed and discussed with patient certain preventive protocols, quality metrics, and best practice recommendations. A written personalized care plan for preventive services as well as general preventive health recommendations were provided to patient.  Signed,    Bevelyn Ngo, LPN  [id number] Nurse Health Advisor   Nurse Notes: having dizzy spells, she forgot to mention it at last visit. States she is drinking plenty of water, and is not getting dizzy when standing,. Does state when it happened last time she drank a coke and it helped. No follow up scheduled at this time. Allows complains of pain in left finger she forgot to mention before, states she doesn't want to take any medications at this time.

## 2019-08-10 DIAGNOSIS — R69 Illness, unspecified: Secondary | ICD-10-CM | POA: Diagnosis not present

## 2019-08-23 DIAGNOSIS — R69 Illness, unspecified: Secondary | ICD-10-CM | POA: Diagnosis not present

## 2019-08-24 ENCOUNTER — Other Ambulatory Visit: Payer: Self-pay | Admitting: Family Medicine

## 2019-08-24 DIAGNOSIS — Z1231 Encounter for screening mammogram for malignant neoplasm of breast: Secondary | ICD-10-CM

## 2019-09-09 ENCOUNTER — Ambulatory Visit
Admission: RE | Admit: 2019-09-09 | Discharge: 2019-09-09 | Disposition: A | Discharge status: Home / Self Care | Payer: Medicare HMO | Source: Ambulatory Visit | Attending: Family Medicine | Admitting: Family Medicine

## 2019-09-09 ENCOUNTER — Ambulatory Visit (INDEPENDENT_AMBULATORY_CARE_PROVIDER_SITE_OTHER): Payer: Medicare HMO | Admitting: Family Medicine

## 2019-09-09 ENCOUNTER — Other Ambulatory Visit: Payer: Self-pay

## 2019-09-09 ENCOUNTER — Encounter: Payer: Self-pay | Admitting: Family Medicine

## 2019-09-09 ENCOUNTER — Ambulatory Visit
Admission: RE | Admit: 2019-09-09 | Discharge: 2019-09-09 | Disposition: A | Discharge status: Home / Self Care | Payer: Medicare HMO | Attending: Family Medicine | Admitting: Family Medicine

## 2019-09-09 VITALS — BP 119/68 | HR 87 | Temp 98.2°F | Ht 61.25 in | Wt 186.2 lb

## 2019-09-09 DIAGNOSIS — M25542 Pain in joints of left hand: Secondary | ICD-10-CM

## 2019-09-09 DIAGNOSIS — R0683 Snoring: Secondary | ICD-10-CM | POA: Diagnosis not present

## 2019-09-09 DIAGNOSIS — R29818 Other symptoms and signs involving the nervous system: Secondary | ICD-10-CM | POA: Insufficient documentation

## 2019-09-09 DIAGNOSIS — M19042 Primary osteoarthritis, left hand: Secondary | ICD-10-CM | POA: Diagnosis not present

## 2019-09-09 DIAGNOSIS — M79645 Pain in left finger(s): Secondary | ICD-10-CM

## 2019-09-09 IMAGING — DX DG FINGER MIDDLE 2+V*L*
3 series · 3 of 3 positions shown · non-contrast
Comparison: None.

CLINICAL DATA: Left middle finger pain and deformity

EXAM:
LEFT MIDDLE FINGER 2+V

[finger ap]
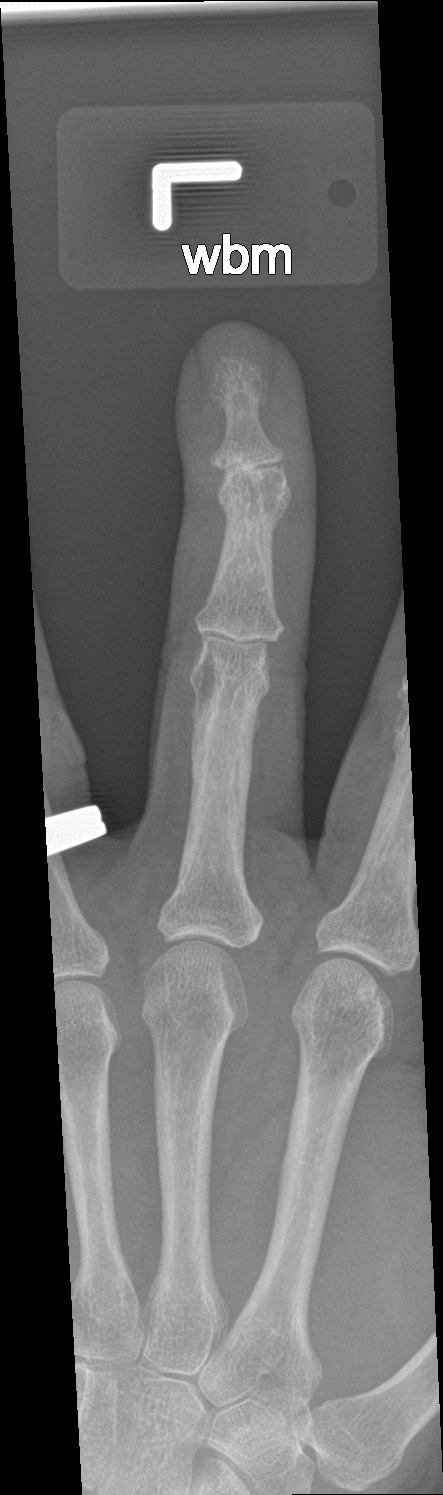

[finger obl]
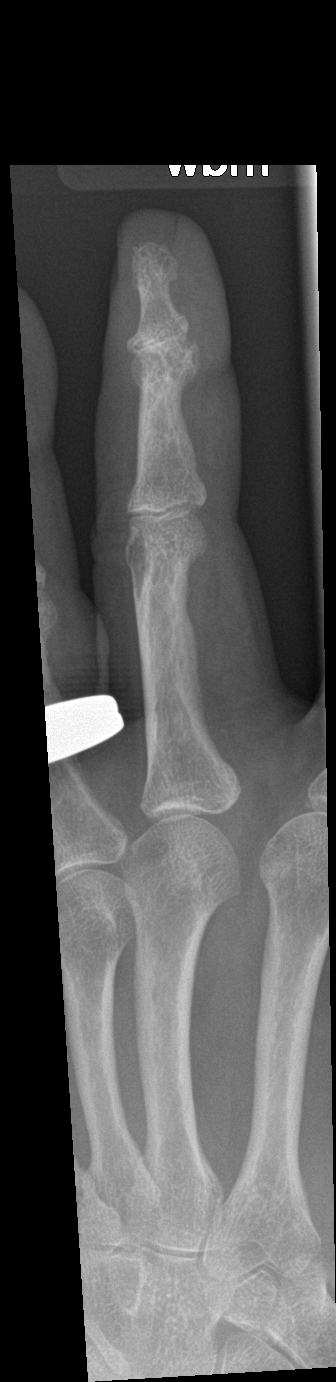

[finger lat]
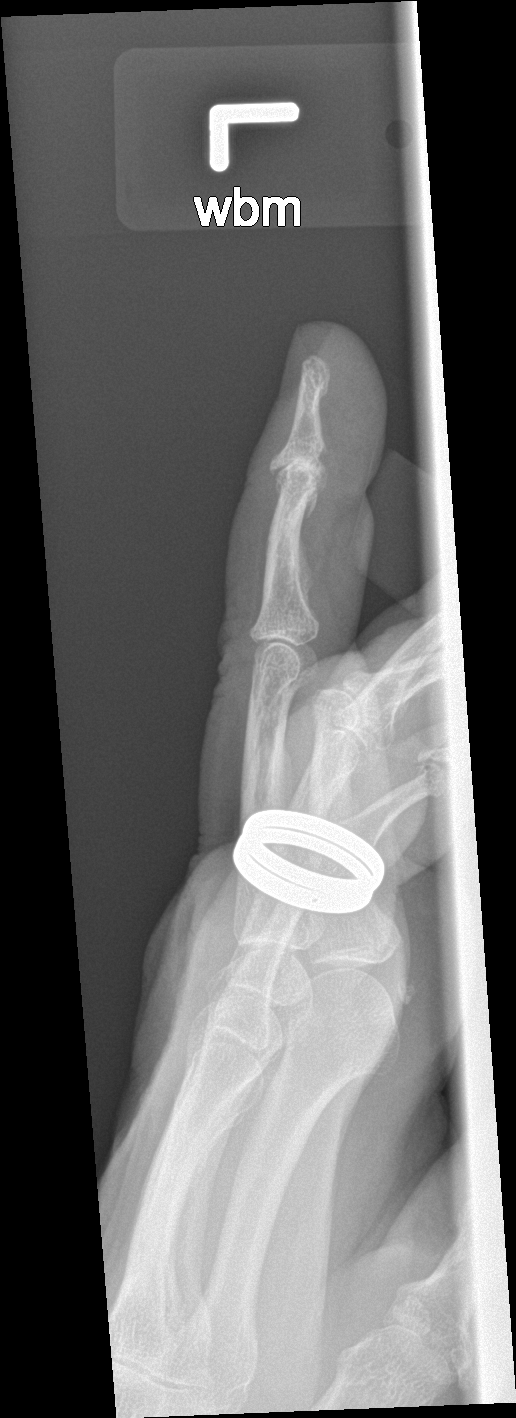

[3 of 3 positions shown; findings below may reference images not displayed]

FINDINGS: Three view radiograph of the left middle finger demonstrates severe
degenerate arthritis involving the DIP joint with mild ulnar
subluxation. No fracture or dislocation. Remaining joint spaces are
preserved. Soft tissues are unremarkable
IMPRESSION: Severe degenerative arthritis of the left third distal
interphalangeal joint.

## 2019-09-09 NOTE — Progress Notes (Signed)
Subjective:    Patient ID: Abigail Delacruz, female    DOB: [birth year], 69 y.o.   MRN: [id number]  Abigail Delacruz is a 69 y.o. female presenting on 09/09/2019 for Snoring (pt snores and was told by her husband that she stop breathing several times in her sleep. She  said her brother had sleep apnea as well.) and Hand Pain (left middle finger pain x 2 mths )   HPI  [name] presents to clinic for concerns of husband telling her that she snores and has periods of apnea at night.  Does have a brother that has OSA.  StopBang 3/8.  Denies any fatigue, tiredness or falling asleep during the daytime.  Has concerns of left middle finger discomfort x 2 months with recent rotation of the distal joint of the third finger on left hand.  Reports no pain today, but that it does throb at times.  Has family history of RA in her brother.  Has not had labs for this in the past or imaging.  Denies injury/trauma/fall or previous left hand fracture/surgery.  Depression screen [facility] 2/9 07/27/2019 05/27/2019 08/26/2017  Decreased Interest 0 0 0  Down, Depressed, Hopeless 0 0 0  PHQ - 2 Score 0 0 0  Altered sleeping - - 0  Tired, decreased energy - - 0  Change in appetite - - 0  Feeling bad or failure about yourself  - - 0  Trouble concentrating - - 0  Moving slowly or fidgety/restless - - 0  Suicidal thoughts - - 0  PHQ-9 Score - - 0    Social History   Tobacco Use  . Smoking status: Former Smoker    Quit date: 1974    Years since quitting: 47.5  . Smokeless tobacco: Never Used  Vaping Use  . Vaping Use: Never used  Substance Use Topics  . Alcohol use: Yes    Comment: social 1x/yr  . Drug use: Never    Review of Systems  Constitutional: Negative.   HENT: Negative.   Eyes: Negative.   Respiratory: Negative.   Cardiovascular: Negative.   Gastrointestinal: Negative.   Endocrine: Negative.   Genitourinary: Negative.   Musculoskeletal: Positive for arthralgias. Negative for back pain, gait  problem, joint swelling, myalgias, neck pain and neck stiffness.  Skin: Negative.   Allergic/Immunologic: Negative.   Neurological: Negative.   Hematological: Negative.   Psychiatric/Behavioral: Negative.    Per HPI unless specifically indicated above     Objective:    BP 119/68 (BP Location: Left Arm, Patient Position: Sitting, Cuff Size: Normal)   Pulse 87   Temp 98.2 F (36.8 C) (Oral)   Ht 5' 1.25" (1.556 m)   Wt 186 lb 3.2 oz (84.5 kg)   SpO2 97%   BMI 34.90 kg/m   Wt Readings from Last 3 Encounters:  09/09/19 186 lb 3.2 oz (84.5 kg)  07/27/19 182 lb 12.8 oz (82.9 kg)  05/27/19 182 lb (82.6 kg)    Physical Exam Vitals reviewed.  Constitutional:      General: She is not in acute distress.    Appearance: Normal appearance. She is well-developed and well-groomed. She is obese. She is not ill-appearing or toxic-appearing.  HENT:     Head: Normocephalic and atraumatic.     Nose:     Comments: Abigail Delacruz is in place, covering mouth and nose. Eyes:     General: Lids are normal. Vision grossly intact.        Right eye: No  discharge.        Left eye: No discharge.     Extraocular Movements: Extraocular movements intact.     Conjunctiva/sclera: Conjunctivae normal.     Pupils: Pupils are equal, round, and reactive to light.  Cardiovascular:     Rate and Rhythm: Normal rate and regular rhythm.     Pulses: Normal pulses.          Dorsalis pedis pulses are 2+ on the right side and 2+ on the left side.     Heart sounds: Normal heart sounds. No murmur heard.  No friction rub. No gallop.   Pulmonary:     Effort: Pulmonary effort is normal. No respiratory distress.     Breath sounds: Normal breath sounds.  Musculoskeletal:        General: Deformity present. No tenderness.     Right hand: Normal.     Left hand: Deformity present. No swelling or tenderness. Normal range of motion. Normal strength. Normal sensation. Normal capillary refill. Normal pulse.     Right lower leg: No  edema.     Left lower leg: No edema.     Comments: Left hand, third digit with distal internal rotation.  Skin:    General: Skin is warm and dry.     Capillary Refill: Capillary refill takes less than 2 seconds.  Neurological:     General: No focal deficit present.     Mental Status: She is alert and oriented to person, place, and time.  Psychiatric:        Attention and Perception: Attention and perception normal.        Mood and Affect: Mood and affect normal.        Speech: Speech normal.        Behavior: Behavior normal. Behavior is cooperative.        Thought Content: Thought content normal.        Cognition and Memory: Cognition and memory normal.        Judgment: Judgment normal.    Results for orders placed or performed in visit on 05/27/19  H. pylori breath test  Result Value Ref Range   H. pylori Breath Test DETECTED (A) NOT DETECT  Lipid Profile  Result Value Ref Range   Cholesterol 138 <200 mg/dL   HDL 53 > OR = 50 mg/dL   Triglycerides 106 <150 mg/dL   LDL Cholesterol (Calc) 66 mg/dL (calc)   Total CHOL/HDL Ratio 2.6 <5.0 (calc)   Non-HDL Cholesterol (Calc) 85 <130 mg/dL (calc)  Thyroid Panel With TSH  Result Value Ref Range   T3 Uptake 29 22 - 35 %   T4, Total 9.9 5.1 - 11.9 mcg/dL   Free Thyroxine Index 2.9 1.4 - 3.8   TSH 1.49 0.40 - 4.50 mIU/L  CBC with Differential  Result Value Ref Range   WBC 5.2 3.8 - 10.8 Thousand/uL   RBC 4.62 3.80 - 5.10 Million/uL   Hemoglobin 14.5 11.7 - 15.5 g/dL   HCT 43.2 35 - 45 %   MCV 93.5 80.0 - 100.0 fL   MCH 31.4 27.0 - 33.0 pg   MCHC 33.6 32.0 - 36.0 g/dL   RDW 13.1 11.0 - 15.0 %   Platelets 242 140 - 400 Thousand/uL   MPV 11.6 7.5 - 12.5 fL   Neutro Abs 3,037 1,500 - 7,800 cells/uL   Lymphs Abs 1,622 850 - 3,900 cells/uL   Absolute Monocytes 338 200 - 950 cells/uL   Eosinophils Absolute 161 15 -  500 cells/uL   Basophils Absolute 42 0 - 200 cells/uL   Neutrophils Relative % 58.4 %   Total Lymphocyte 31.2 %    Monocytes Relative 6.5 %   Eosinophils Relative 3.1 %   Basophils Relative 0.8 %  COMPLETE METABOLIC PANEL WITH GFR  Result Value Ref Range   Glucose, Bld 99 65 - 99 mg/dL   BUN 22 7 - 25 mg/dL   Creat 0.66 0.50 - 0.99 mg/dL   GFR, Est Non African American 90 > OR = 60 mL/min/1.2m   GFR, Est African American 104 > OR = 60 mL/min/1.792m  BUN/Creatinine Ratio NOT APPLICABLE 6 - 22 (calc)   Sodium 140 135 - 146 mmol/L   Potassium 4.4 3.5 - 5.3 mmol/L   Chloride 105 98 - 110 mmol/L   CO2 30 20 - 32 mmol/L   Calcium 9.5 8.6 - 10.4 mg/dL   Total Protein 7.0 6.1 - 8.1 g/dL   Albumin 4.3 3.6 - 5.1 g/dL   Globulin 2.7 1.9 - 3.7 g/dL (calc)   AG Ratio 1.6 1.0 - 2.5 (calc)   Total Bilirubin 0.5 0.2 - 1.2 mg/dL   Alkaline phosphatase (APISO) 120 37 - 153 U/L   AST 23 10 - 35 U/L   ALT 30 (H) 6 - 29 U/L  POCT Urinalysis Dipstick  Result Value Ref Range   Color, UA Dark yellow    Clarity, UA Clear    Glucose, UA Negative Negative   Bilirubin, UA Negative    Ketones, UA Negative    Spec Grav, UA >=1.030 (A) 1.010 - 1.025   Blood, UA Trace    pH, UA 5.0 5.0 - 8.0   Protein, UA Negative Negative   Urobilinogen, UA 0.2 0.2 or 1.0 E.U./dL   Nitrite, UA Negative    Leukocytes, UA Small (1+) (A) Negative   Appearance     Odor        Assessment & Plan:   Problem List Items Addressed This Visit      Other   Pain of left middle finger    Left middle finger with deformity, reported by patient as recent onset, with throbbing pain at times.  Has family history of RA but no lab evaluation.  Denies injury/trauma/fall, previous surgery or fracture to left hand/fingers.  Plan: 1. Left middle finger xray ordered for today 2. Labs for CBC, CMP, RH, CCP, ANA, ESR and CCP ordered 3. F/U after labs      Suspected sleep apnea    Likely OSA based on spouse's concern of observed apneas.  Discussed sleep study for evaluation and patient agreeable with treatment plan.  Plan: 1. Sleep study  ordered 2. F/U once completed       Other Visit Diagnoses    Arthralgia of left hand    -  Primary   Relevant Orders   CBC with Differential   Rheumatoid factor   Cyclic citrul peptide antibody, IgG   Antinuclear Antib (ANA)   C-reactive protein   Sedimentation rate   DG Finger Middle Left   Snoring       Relevant Orders   Nocturnal polysomnography      No orders of the defined types were placed in this encounter.     Follow up plan: Return in about 4 weeks (around 10/07/2019) for Joint pain and snoring f/u.   NiHarlin RainFNWoodamily Nurse Practitioner [facility] 09/09/2019, 10:32 AM

## 2019-09-09 NOTE — Assessment & Plan Note (Signed)
Left middle finger with deformity, reported by patient as recent onset, with throbbing pain at times.  Has family history of RA but no lab evaluation.  Denies injury/trauma/fall, previous surgery or fracture to left hand/fingers.  Plan: 1. Left middle finger xray ordered for today 2. Labs for CBC, CMP, RH, CCP, ANA, ESR and CCP ordered 3. F/U after labs 

## 2019-09-09 NOTE — Patient Instructions (Signed)
I have put in an order for lab work and we will contact you when we receive the results.  Have your xray completed today and we will contact you when we receive the over-read from the radiology department.  I have put in an order for a sleep study.  Someone should contact you within 1 week.  If you haven't heard anything by next Thursday, please let me know and I will follow up with the referral coordinator.  We will plan to see you back in 4 weeks to follow up on your left middle finger joint pain and snoring  You will receive a survey after today's visit either digitally by e-mail or paper by [place] mail. Your experiences and feedback matter to [place].  Please respond so we know how we are doing as we provide care for you.  Call us with any questions/concerns/needs.  It is my goal to be available to you for your health concerns.  Thanks for choosing me to be a partner in your healthcare needs!  Harlin Rain, FNP-C Family Nurse Practitioner [facility] Phone: [phone]

## 2019-09-09 NOTE — Assessment & Plan Note (Signed)
Likely OSA based on spouse's concern of observed apneas.  Discussed sleep study for evaluation and patient agreeable with treatment plan.  Plan: 1. Sleep study ordered 2. F/U once completed

## 2019-09-13 LAB — CBC WITH DIFFERENTIAL/PLATELET
Absolute Monocytes: 421 cells/uL (ref 200–950)
Basophils Absolute: 49 cells/uL (ref 0–200)
Basophils Relative: 1 %
Eosinophils Absolute: 132 cells/uL (ref 15–500)
Eosinophils Relative: 2.7 %
HCT: 43.2 % (ref 35.0–45.0)
Hemoglobin: 14.2 g/dL (ref 11.7–15.5)
Lymphs Abs: 1710 cells/uL (ref 850–3900)
MCH: 30.9 pg (ref 27.0–33.0)
MCHC: 32.9 g/dL (ref 32.0–36.0)
MCV: 94.1 fL (ref 80.0–100.0)
MPV: 11 fL (ref 7.5–12.5)
Monocytes Relative: 8.6 %
Neutro Abs: 2587 cells/uL (ref 1500–7800)
Neutrophils Relative %: 52.8 %
Platelets: 213 10*3/uL (ref 140–400)
RBC: 4.59 10*6/uL (ref 3.80–5.10)
RDW: 12.8 % (ref 11.0–15.0)
Total Lymphocyte: 34.9 %
WBC: 4.9 10*3/uL (ref 3.8–10.8)

## 2019-09-13 LAB — ANTI-NUCLEAR AB-TITER (ANA TITER)
ANA TITER: 1:80 {titer} — ABNORMAL HIGH
ANA Titer 1: 1:160 {titer} — ABNORMAL HIGH

## 2019-09-13 LAB — RHEUMATOID FACTOR: Rhuematoid fact SerPl-aCnc: <14 IU/mL (ref <14)

## 2019-09-13 LAB — SEDIMENTATION RATE: Sed Rate: 9 mm/h (ref 0–30)

## 2019-09-13 LAB — C-REACTIVE PROTEIN: CRP: 2 mg/L (ref <8.0)

## 2019-09-13 LAB — ANA: Anti Nuclear Antibody (ANA): POSITIVE — AB

## 2019-09-13 LAB — CYCLIC CITRUL PEPTIDE ANTIBODY, IGG: Cyclic Citrullin Peptide Ab: <16 UNITS

## 2019-09-14 ENCOUNTER — Other Ambulatory Visit: Payer: Self-pay | Admitting: Family Medicine

## 2019-09-14 DIAGNOSIS — R768 Other specified abnormal immunological findings in serum: Secondary | ICD-10-CM

## 2019-09-14 DIAGNOSIS — R7689 Other specified abnormal immunological findings in serum: Secondary | ICD-10-CM | POA: Insufficient documentation

## 2019-09-14 DIAGNOSIS — M79645 Pain in left finger(s): Secondary | ICD-10-CM

## 2019-09-27 ENCOUNTER — Other Ambulatory Visit: Payer: Self-pay

## 2019-09-27 ENCOUNTER — Ambulatory Visit
Admission: RE | Admit: 2019-09-27 | Discharge: 2019-09-27 | Disposition: A | Discharge status: Home / Self Care | Payer: Medicare HMO | Source: Ambulatory Visit | Attending: Family Medicine | Admitting: Family Medicine

## 2019-09-27 DIAGNOSIS — Z1231 Encounter for screening mammogram for malignant neoplasm of breast: Secondary | ICD-10-CM

## 2019-09-27 IMAGING — MG DIGITAL SCREENING BILAT W/ TOMO W/ CAD
8 series · 8 of 24 positions shown · non-contrast
Comparison: Previous exam(s).

CLINICAL DATA: Screening.

EXAM:
DIGITAL SCREENING BILATERAL MAMMOGRAM WITH TOMO AND CAD

[R MLO synth-2D]
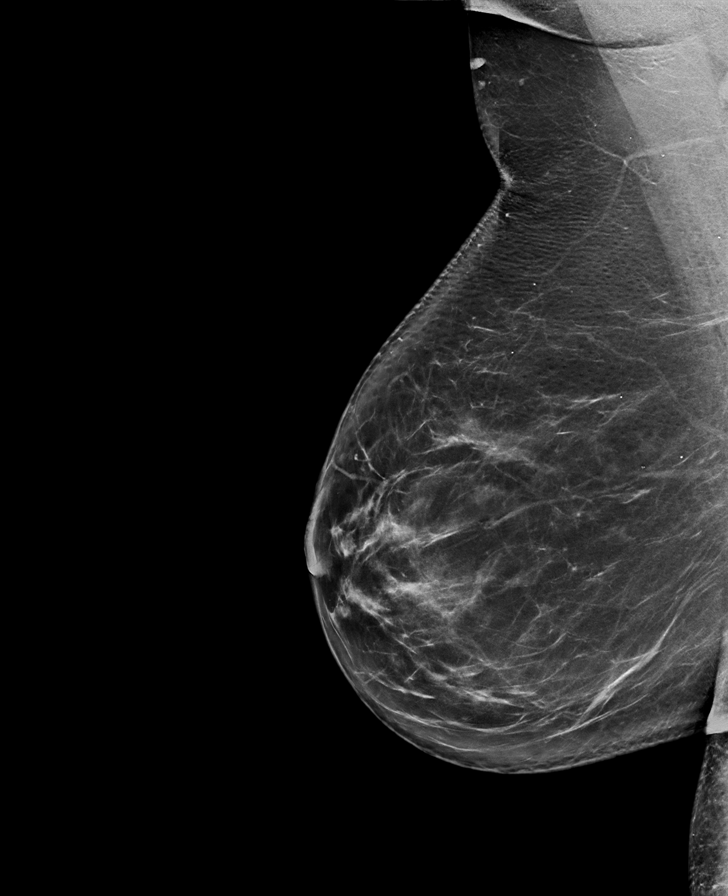

[R CC synth-2D]
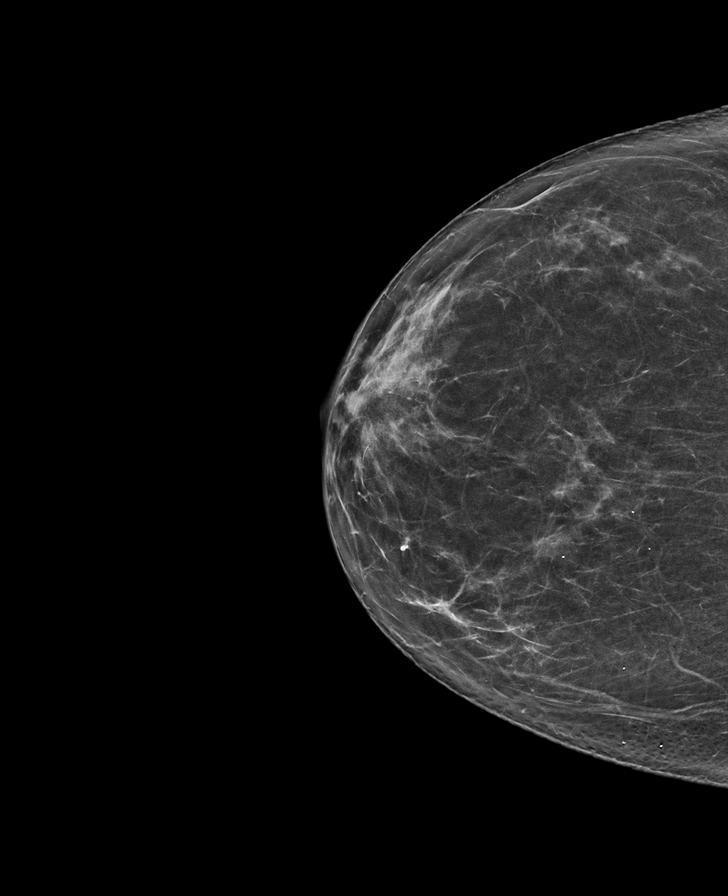

[L CC synth-2D]
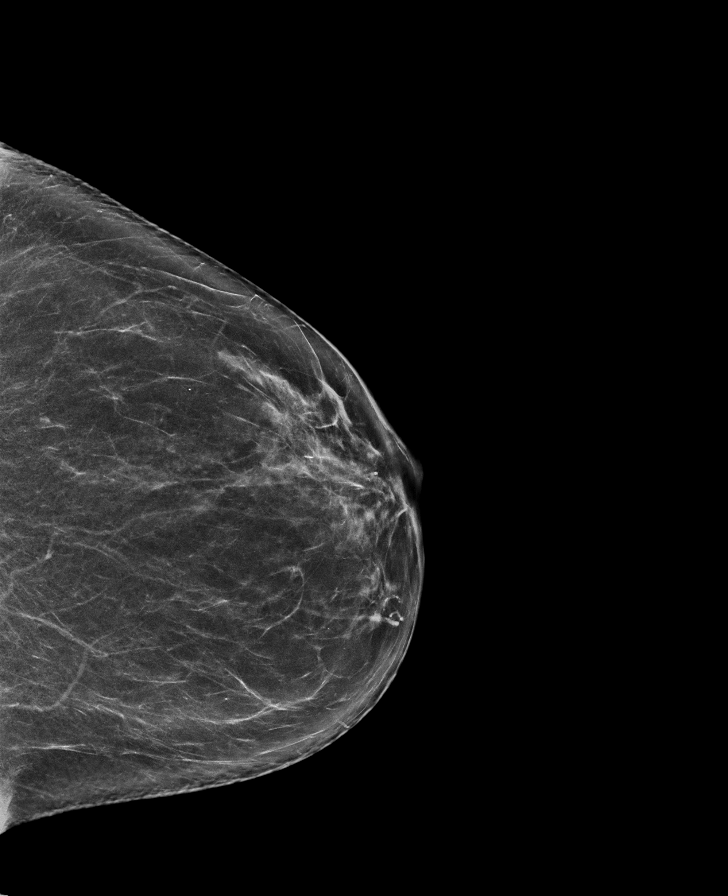

[L MLO synth-2D]
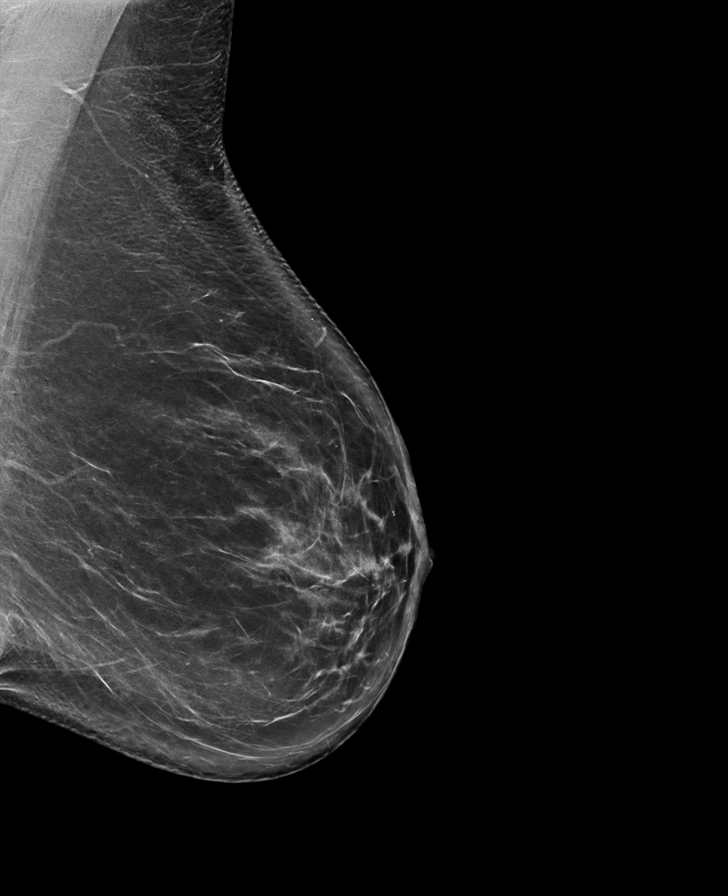

[L MLO tomo · tomo slice 45/89.0]
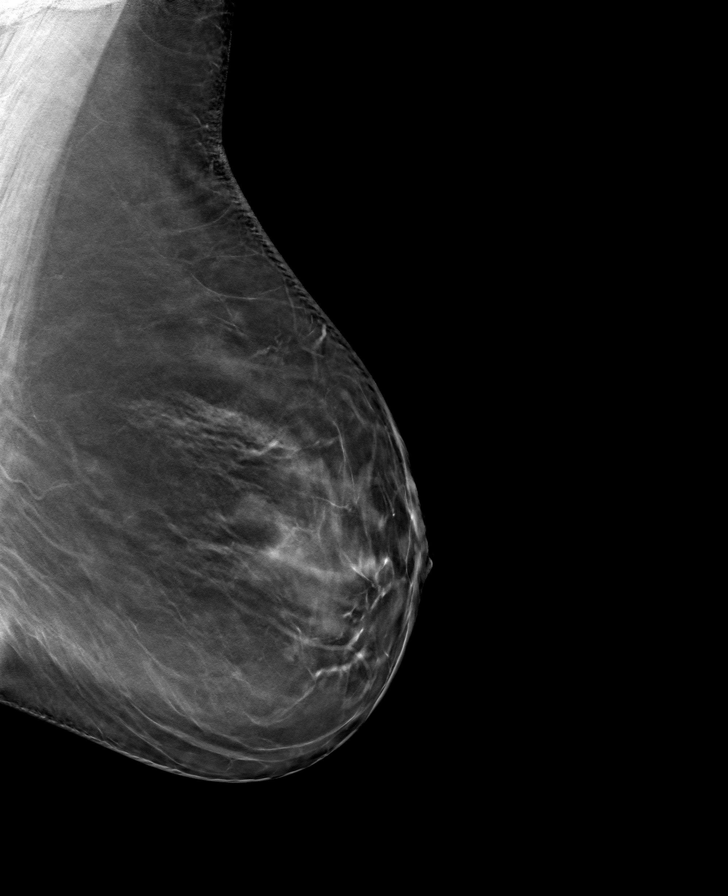

[L CC tomo · tomo slice 39/76.0]
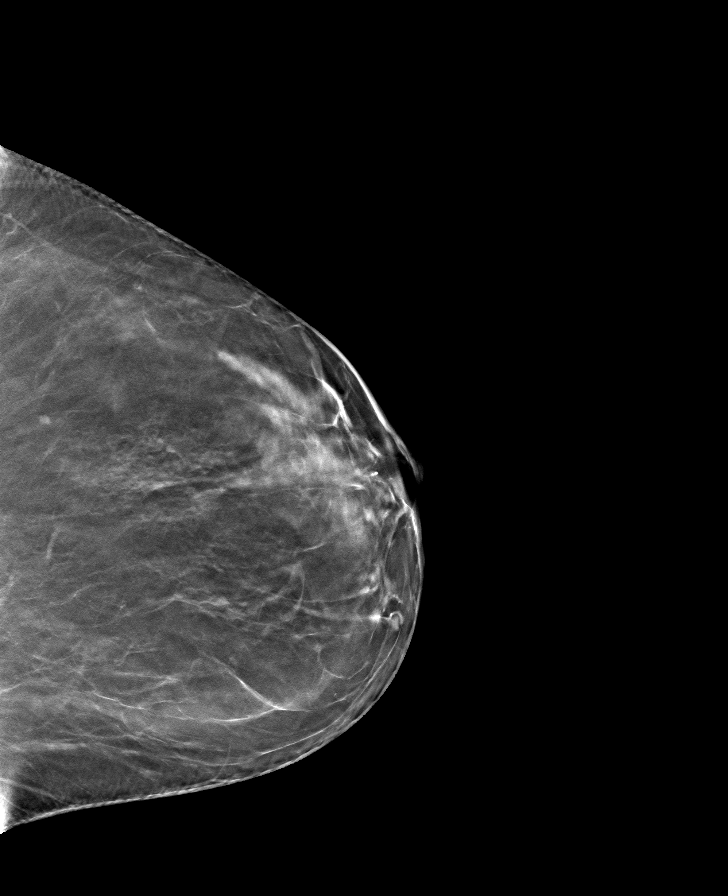

[R MLO tomo · tomo slice 44/87.0]
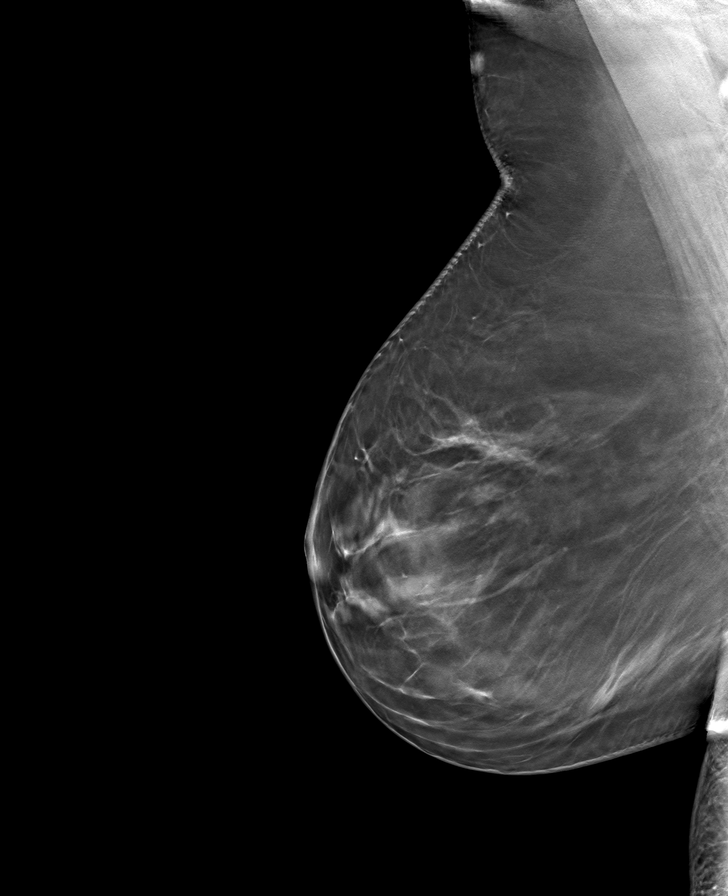

[R CC tomo · tomo slice 36/71.0]
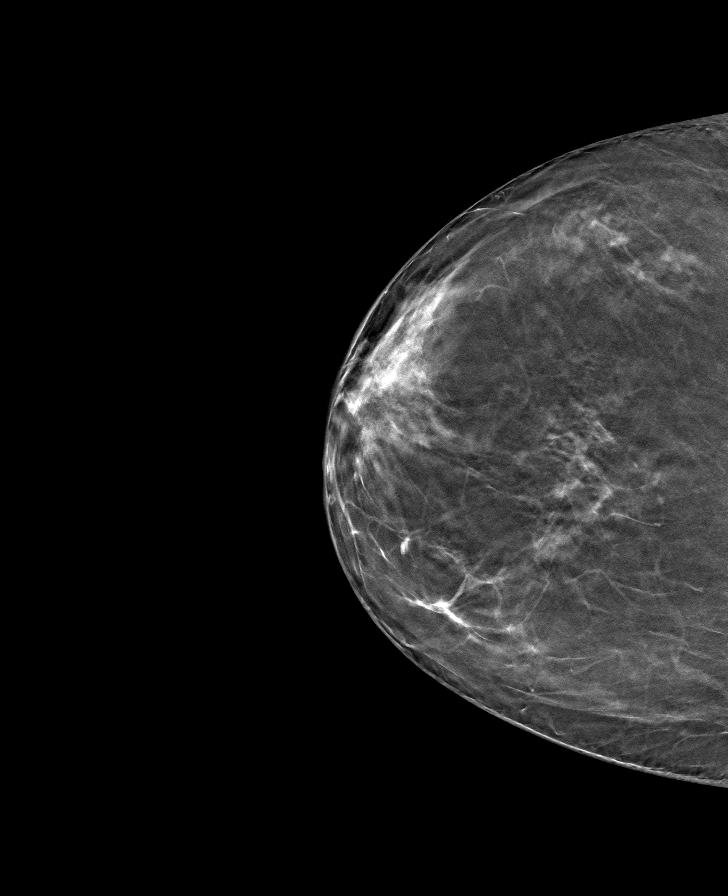

[8 of 24 positions shown; findings below may reference images not displayed]

ACR Breast Density Category b: There are scattered areas of
fibroglandular density.
FINDINGS: There are no findings suspicious for malignancy. Images were
processed with CAD.
IMPRESSION: No mammographic evidence of malignancy. A result letter of this
screening mammogram will be mailed directly to the patient.

RECOMMENDATION:
Screening mammogram in one year. (Code:[TQ])

BI-RADS CATEGORY  1: Negative.

## 2019-09-29 ENCOUNTER — Telehealth: Payer: Self-pay

## 2019-09-29 NOTE — Telephone Encounter (Signed)
Copied from [place] [id number]. Topic: Referral - Request for Referral >> Sep 28, 2019  2:10 PM Celene Kras wrote: Has patient seen PCP for this complaint? Yes.   *If NO, is insurance requiring patient see PCP for this issue before PCP can refer them? Referral for which specialty: Sleep Study  Preferred provider/office: N/A Reason for referral: Sleep Study because she is concerned that she stops breathing throughout the night. Please advise.     I called [facility] and left a message on the vm to return my call concerning the referral placed on 09/09/2019

## 2019-09-29 NOTE — Telephone Encounter (Signed)
I sent a message to [facility] referral coordinator to fax over the order again to [facility], because they said they never received the first referral.

## 2019-10-06 ENCOUNTER — Telehealth: Payer: Self-pay

## 2019-10-06 NOTE — Telephone Encounter (Signed)
Copied from [place] [phone]. Topic: General - Inquiry >> Oct 05, 2019  4:09 PM Alease Frame wrote: Reason for CRM: Pt calle din wanting an update on sleep study referral . Please reach out to pt .     I left a detail message on the patient voicemail on the status of her sleepy study. After talking to someone at [facility] she informed me that the patient referral is currently in the insurance phase and that the next phase is scheduling an appt.

## 2019-10-13 DIAGNOSIS — M19042 Primary osteoarthritis, left hand: Secondary | ICD-10-CM | POA: Diagnosis not present

## 2019-10-13 DIAGNOSIS — M7061 Trochanteric bursitis, right hip: Secondary | ICD-10-CM | POA: Diagnosis not present

## 2019-10-13 DIAGNOSIS — R768 Other specified abnormal immunological findings in serum: Secondary | ICD-10-CM | POA: Diagnosis not present

## 2019-10-15 DIAGNOSIS — G4733 Obstructive sleep apnea (adult) (pediatric): Secondary | ICD-10-CM | POA: Diagnosis not present

## 2019-10-18 DIAGNOSIS — Z87891 Personal history of nicotine dependence: Secondary | ICD-10-CM | POA: Diagnosis not present

## 2019-10-18 DIAGNOSIS — Z6835 Body mass index (BMI) 35.0-35.9, adult: Secondary | ICD-10-CM | POA: Diagnosis not present

## 2019-10-18 DIAGNOSIS — R32 Unspecified urinary incontinence: Secondary | ICD-10-CM | POA: Diagnosis not present

## 2019-10-18 DIAGNOSIS — E785 Hyperlipidemia, unspecified: Secondary | ICD-10-CM | POA: Diagnosis not present

## 2019-10-18 DIAGNOSIS — E669 Obesity, unspecified: Secondary | ICD-10-CM | POA: Diagnosis not present

## 2019-10-18 DIAGNOSIS — R030 Elevated blood-pressure reading, without diagnosis of hypertension: Secondary | ICD-10-CM | POA: Diagnosis not present

## 2019-10-18 DIAGNOSIS — K219 Gastro-esophageal reflux disease without esophagitis: Secondary | ICD-10-CM | POA: Diagnosis not present

## 2019-10-22 ENCOUNTER — Telehealth: Payer: Self-pay | Admitting: Family Medicine

## 2019-10-22 NOTE — Telephone Encounter (Signed)
Reviewed sleep study results with patient showing severe OSA and recommendation of CPAP trial.  Patient reports Feeling Doristine Devoid has let her know they are waiting on insurance approval to get this scheduled.

## 2019-10-30 ENCOUNTER — Encounter (INDEPENDENT_AMBULATORY_CARE_PROVIDER_SITE_OTHER): Payer: Medicare HMO | Admitting: Internal Medicine

## 2019-10-30 DIAGNOSIS — G4733 Obstructive sleep apnea (adult) (pediatric): Secondary | ICD-10-CM | POA: Diagnosis not present

## 2019-11-08 ENCOUNTER — Telehealth (INDEPENDENT_AMBULATORY_CARE_PROVIDER_SITE_OTHER): Payer: Medicare HMO | Admitting: Family Medicine

## 2019-11-08 ENCOUNTER — Encounter: Payer: Self-pay | Admitting: Family Medicine

## 2019-11-08 ENCOUNTER — Other Ambulatory Visit: Payer: Self-pay

## 2019-11-08 DIAGNOSIS — R509 Fever, unspecified: Secondary | ICD-10-CM | POA: Insufficient documentation

## 2019-11-08 DIAGNOSIS — R52 Pain, unspecified: Secondary | ICD-10-CM

## 2019-11-08 DIAGNOSIS — R05 Cough: Secondary | ICD-10-CM | POA: Diagnosis not present

## 2019-11-08 DIAGNOSIS — R059 Cough, unspecified: Secondary | ICD-10-CM | POA: Insufficient documentation

## 2019-11-08 MED ORDER — IBUPROFEN 800 MG PO TABS: 800.0000 mg | ORAL_TABLET | Freq: Three times a day (TID) | ORAL | 0 refills | Status: DC | PRN

## 2019-11-08 MED ORDER — PROMETHAZINE-DM 6.25-15 MG/5ML PO SYRP: 5.0000 mL | ORAL_SOLUTION | Freq: Four times a day (QID) | ORAL | 0 refills | Status: DC | PRN

## 2019-11-08 NOTE — Assessment & Plan Note (Signed)
Likely viral infection based on symptoms, could be COVID-19.  Discussed treating symptomatically and will send in a prescription for ibuprofen and promethazine DM.  Will come to our clinic at 4:15pm for drive up COVID testing today.  Encouraged to increase food and fluids, rest, and continue to quarantine/isolate until test results are received.  Plan: 1. Treat symptomatically, can take ibuprofen 800mg  every 8 hours as needed for body aches/feverish feeling 2. Can take promethazine DM 54mL every 4-6 hours as needed for cough and cold symptoms 3. Come to clinic at 4:15pm today for drive up COVID testing 4. RTC if symptoms worsen or fail to improve 5. Strict ER precautions provided

## 2019-11-08 NOTE — Patient Instructions (Addendum)
As we discussed, I have sent in a prescription for Ibuprofen 800mg  to take 1 tablet every 8 hours as needed for feeling feverish or generalized body aches.  I have sent in a prescription for promethazine DM to take 70mL every 4-6 hours as needed for cough/nasal congestion.  Be careful not to drive or operate heavy machinery while taking this medication, as it can cause sedation.  Can drive up to clinic today from 4:15pm-4:45pm for drive up COVID testing.  Come to the back of the building by the tree and picnic table, one of [place] will come outside to take your COVID swab.  Continue all of your medications as prescribed.  Can take over the counter zinc, vitamin C and elderberry to help boost immune health  Be sure to increase your fluids and have your body rest.  If you begin to have worsening shortness of breath, chest pain, fever over 104 that is not responsive to ibuprofen and/or acetaminophen, or impending sense of doom to [place]!  We will plan to see you back if your symptoms worsen or fail to improve  You will receive a survey after today's visit either digitally by e-mail or paper by USPS mail. Your experiences and feedback matter to [place].  Please respond so we know how we are doing as we provide care for you.  Call us with any questions/concerns/needs.  It is my goal to be available to you for your health concerns.  Thanks for choosing me to be a partner in your healthcare needs!  Harlin Rain, FNP-C Family Nurse Practitioner [facility] Phone: [phone]

## 2019-11-08 NOTE — Assessment & Plan Note (Signed)
See cough A/P 

## 2019-11-08 NOTE — Progress Notes (Signed)
Virtual Visit via MyChart Video Visit  The purpose of this virtual visit is to provide medical care while limiting exposure to the novel coronavirus (COVID19) for both patient and office staff.  Consent was obtained for phone visit:  Yes.   Answered questions that patient had about telehealth interaction:  Yes.   I discussed the limitations, risks, security and privacy concerns of performing an evaluation and management service by telephone. I also discussed with the patient that there may be a patient responsible charge related to this service. The patient expressed understanding and agreed to proceed.  Patient is at home and is accessed via [place] are provided by Harlin Rain, FNP-C from [facility])  ---------------------------------------------------------------------- Chief Complaint  Patient presents with  . Covid Exposure    scratchy throat, dry cough, lack of appetite, episode of diarrhea, bodyaches, chills and headache x 3 days     S: Reviewed CMA documentation. I have called patient and gathered additional HPI as follows:  Abigail Delacruz presents to clinic via [place] Visit for concerns of dry/scratchy throat, dry cough, lack of appetite, episode of diarrhea, generalized body aches, chills, headache and feeling feverish since Friday 11/05/2019.  Denies any sick contact exposure.  Temperature has been as high as 68F.  Has not taken anything for her symptoms.  Denies change in taste/smell, SOB, CP, DOE, abdominal pain, nausea or vomiting.  Has not had COVID testing completed yet.  Has had both doses of [place] vaccine  Patient is currently home in quarantine Denies any high risk travel to areas of current concern for COVID19. Denies any known or suspected exposure to person with or possibly with COVID19.  Past Medical History:  Diagnosis Date  . Cataract   . Hyperlipidemia    Social History   Tobacco Use  . Smoking  status: Former Smoker    Quit date: 1974    Years since quitting: 47.7  . Smokeless tobacco: Never Used  Vaping Use  . Vaping Use: Never used  Substance Use Topics  . Alcohol use: Yes    Comment: social 1x/yr  . Drug use: Never    Current Outpatient Medications:  .  atorvastatin (LIPITOR) 20 MG tablet, Take 1 tablet (20 mg total) by mouth daily., Disp: 90 tablet, Rfl: 3 .  Multiple Vitamins-Minerals (MULTIVITAMIN ADULT) CHEW, Chew by mouth., Disp: , Rfl:  .  Pseudoephedrine-APAP-DM (COLD/FLU DAYTIME PO), Take by mouth., Disp: , Rfl:  .  ibuprofen (ADVIL) 800 MG tablet, Take 1 tablet (800 mg total) by mouth every 8 (eight) hours as needed., Disp: 30 tablet, Rfl: 0 .  omeprazole (PRILOSEC) 20 MG capsule, Take 1 capsule (20 mg total) by mouth 2 (two) times daily before a meal for 14 days. (Patient taking differently: Take 20 mg by mouth daily as needed. ), Disp: 28 capsule, Rfl: 0 .  promethazine-dextromethorphan (PROMETHAZINE-DM) 6.25-15 MG/5ML syrup, Take 5 mLs by mouth 4 (four) times daily as needed for cough., Disp: 118 mL, Rfl: 0  Depression screen [facility] 2/9 07/27/2019 05/27/2019 08/26/2017  Decreased Interest 0 0 0  Down, Depressed, Hopeless 0 0 0  PHQ - 2 Score 0 0 0  Altered sleeping - - 0  Tired, decreased energy - - 0  Change in appetite - - 0  Feeling bad or failure about yourself  - - 0  Trouble concentrating - - 0  Moving slowly or fidgety/restless - - 0  Suicidal thoughts - - 0  PHQ-9 Score - - 0    No flowsheet data found.  -------------------------------------------------------------------------- O: No physical exam performed due to remote telephone encounter.  Physical Exam: Patient remotely monitored with video.  Verbal communication appropriate.  Cognition normal.  Recent Results (from the past 2160 hour(s))  CBC with Differential     Status: None   Collection Time: 09/09/19 10:08 AM  Result Value Ref Range   WBC 4.9 3.8 - 10.8 Thousand/uL   RBC 4.59 3.80 - 5.10  Million/uL   Hemoglobin 14.2 11.7 - 15.5 g/dL   HCT 43.2 35 - 45 %   MCV 94.1 80.0 - 100.0 fL   MCH 30.9 27.0 - 33.0 pg   MCHC 32.9 32.0 - 36.0 g/dL   RDW 12.8 11.0 - 15.0 %   Platelets 213 140 - 400 Thousand/uL   MPV 11.0 7.5 - 12.5 fL   Neutro Abs 2,587 1,500 - 7,800 cells/uL   Lymphs Abs 1,710 850 - 3,900 cells/uL   Absolute Monocytes 421 200 - 950 cells/uL   Eosinophils Absolute 132 15 - 500 cells/uL   Basophils Absolute 49 0 - 200 cells/uL   Neutrophils Relative % 52.8 %   Total Lymphocyte 34.9 %   Monocytes Relative 8.6 %   Eosinophils Relative 2.7 %   Basophils Relative 1.0 %  Rheumatoid factor     Status: None   Collection Time: 09/09/19 10:08 AM  Result Value Ref Range   Rhuematoid fact SerPl-aCnc <92 <42 IU/mL  Cyclic citrul peptide antibody, IgG     Status: None   Collection Time: 09/09/19 10:08 AM  Result Value Ref Range   Cyclic Citrullin Peptide Ab <16 UNITS    Comment: Reference Range Negative:            <20 Weak Positive:       20-39 Moderate Positive:   40-59 Strong Positive:     >59 .   Antinuclear Antib (ANA)     Status: Abnormal   Collection Time: 09/09/19 10:08 AM  Result Value Ref Range   Anti Nuclear Antibody (ANA) POSITIVE (A) NEGATIVE    Comment: ANA IFA is a first line screen for detecting the presence of up to approximately 150 autoantibodies in various autoimmune diseases. A positive ANA IFA result is suggestive of autoimmune disease and reflexes to titer and pattern. Further laboratory testing may be considered if clinically indicated. . For additional information, please refer to http://education.[facility].com/faq/FAQ177 (This link is being provided for informational/ educational purposes only.) .   C-reactive protein     Status: None   Collection Time: 09/09/19 10:08 AM  Result Value Ref Range   CRP 2.0 <8.0 mg/L  Sedimentation rate     Status: None   Collection Time: 09/09/19 10:08 AM  Result Value Ref Range   Sed Rate  9 0 - 30 mm/h  Anti-nuclear ab-titer (ANA titer)     Status: Abnormal   Collection Time: 09/09/19 10:08 AM  Result Value Ref Range   ANA Titer 1 1:160 (H) titer    Comment:                 Reference Range                 <1:40        Negative                 1:40-1:80    Low Antibody Level                 >  1:80        Elevated Antibody Level .    ANA Pattern 1 Nuclear, Speckled (A)     Comment: Speckled pattern is associated with mixed connective tissue disease (MCTD), systemic lupus erythematosus (SLE), Sjogren's syndrome, dermatomyositis, and  systemic sclerosis/polymyositis overlap. . AC-2,4,5,29: Speckled . International Consensus on ANA Patterns (https://www.hernandez-brewer.com/)    ANA TITER 1:80 (H) titer    Comment: A low level ANA titer may be present in pre-clinical autoimmune diseases and normal individuals.                 Reference Range                 <1:40        Negative                 1:40-1:80    Low Antibody Level                 >1:80        Elevated Antibody Level .    ANA PATTERN Cytoplasmic (A)     Comment: The presence of cytoplasmic fluorescence was noted on the HEp-2 slide. Other reactivities (e.g., anti- mitochondrial antibodies or anti-smooth muscle antibodies) may be responsible for this fluorescence. The clinical significance of this finding is uncertain. Clinical correlation is recommended. . AC-15 to AC-23: Cytoplasmic . International Consensus on ANA Patterns (https://www.hernandez-brewer.com/)     -------------------------------------------------------------------------- A&P:  Problem List Items Addressed This Visit      Other   Cough - Primary    Likely viral infection based on symptoms, could be COVID-19.  Discussed treating symptomatically and will send in a prescription for ibuprofen and promethazine DM.  Will come to our clinic at 4:15pm for drive up COVID testing today.  Encouraged to increase food and fluids,  rest, and continue to quarantine/isolate until test results are received.  Plan: 1. Treat symptomatically, can take ibuprofen 800mg  every 8 hours as needed for body aches/feverish feeling 2. Can take promethazine DM 76mL every 4-6 hours as needed for cough and cold symptoms 3. Come to clinic at 4:15pm today for drive up COVID testing 4. RTC if symptoms worsen or fail to improve 5. Strict ER precautions provided      Relevant Medications   promethazine-dextromethorphan (PROMETHAZINE-DM) 6.25-15 MG/5ML syrup   Other Relevant Orders   Novel Coronavirus, NAA ([facility])   Feels feverish    See cough A/P      Relevant Medications   ibuprofen (ADVIL) 800 MG tablet   Other Relevant Orders   Novel Coronavirus, NAA ([facility])   Body aches    See cough A/P      Relevant Medications   ibuprofen (ADVIL) 800 MG tablet   Other Relevant Orders   Novel Coronavirus, NAA ([facility])      Meds ordered this encounter  Medications  . ibuprofen (ADVIL) 800 MG tablet    Sig: Take 1 tablet (800 mg total) by mouth every 8 (eight) hours as needed.    Dispense:  30 tablet    Refill:  0  . promethazine-dextromethorphan (PROMETHAZINE-DM) 6.25-15 MG/5ML syrup    Sig: Take 5 mLs by mouth 4 (four) times daily as needed for cough.    Dispense:  118 mL    Refill:  0    Follow-up: - Return if symptoms worsen or fail to improve - To proceed to our clinic today at 4:15pm for drive up COVID testing  Patient verbalizes understanding with the above  medical recommendations including the limitation of remote medical advice.  Specific follow-up and call-back criteria were given for patient to follow-up or seek medical care more urgently if needed.  - Time spent in direct consultation with patient on video: 8 minutes  Harlin Rain, [facility] 11/08/2019, 10:11 AM

## 2019-11-09 NOTE — Procedures (Signed)
°  [place] Report Part I  Phone: [phone] Fax: [phone]  Patient Name: Abigail Delacruz, Abigail Delacruz Acquisition Number: [id number]  Date of Birth: 05/20/1950 Acquisition Date: 10/30/2019  Referring Physician: Barnett Abu, FNP     History: The patient is a 69 year old female with obstructive sleep apnea for CPAP titration. Medical History: hypercholesterolemia.  Medications: none documented.  Procedure: This routine overnight polysomnogram was performed on the [place] 5 using the standard CPAP protocol. This included 6 channels of EEG, 2 channels of EOG, chin EMG, bilateral anterior tibialis EMG, nasal/oral thermistor, PTAF (nasal pressure transducer), chest and abdominal wall movements, EKG, and pulse oximetry.  Description: The total recording time was 393.2 minutes. The total sleep time was 251.0 minutes. There were a total of 121.2 minutes of wakefulness after sleep onset for a?reduced????sleep efficiency of 63.8%. The latency to sleep onset was within normal limits?at 21.0 minutes. The R sleep onset latency was?prolonged at 231.5 minutes. Sleep parameters, as a percentage of the total sleep time, demonstrated 24.9% of sleep was in N1 sleep, 42.8% N2, 16.1% N3 and 16.1% R sleep. There were a total of 42 arousals for an arousal index of 10.0 arousals per hour of sleep that was normal.???  Overall, there were a total of 35 respiratory events for a respiratory disturbance index, which includes apneas, hypopneas and RERAs (increased respiratory effort) of 8.4 respiratory events per hour of sleep during the pressure titration. CPAP was initiated at 4 cm H2O at lights out, 10:48 p.m. It was titrated in 1-2 cm increments, largely for REM-related obstructive events to the final pressure of 20 cm H2O. Obstructive events persisted in REM sleep at this pressure. All sleep was in the supine position.  Additionally, the baseline oxygen saturation during wakefulness was 97%, during NREM  sleep averaged 95%, and during REM sleep averaged 94%. The total duration of oxygen < 90% was 13.8 minutes and <80% was 1.3 minutes.  Cardiac monitoring- There were no significant cardiac rhythm irregularities.   Periodic limb movement monitoring- did not demonstrate periodic limb movements. Quasi-periodic limb movements were observed during periods of wakefulness.  Impression: This patients obstructive sleep apnea demonstrated significant improvement with the utilization of nasal CPAP in non-REM sleep, however the respiratory events persisted at 20 cm H2O in REM. All sleep was in the supine position.   Quasi-periodic limb movements were observed during periods of wakefulness. Sometimes these limb movements appear transiently with the initiation of CPAP. Clinical correlation is suggested.   Recommendations: 1. Patient did not appear to have good control of her OSA despite titration up to a pressure of CPAP 20 cm H2O. The overall AHI was 8.4 per hour but the AHI during stage R was 16.3 per hour.  2. In addition desaturations are still noted in this case with the lowest saturation of 76% 3. Would recommend a BiPAP titration study. After determining best therapeutic pressure in supine REM sleep would recommend moving patient to the non-supine position to determine if pressure can be reduced. 4. Weight loss should be considered in this patient with a BMI of 27.      5. An Air Fit N20 mask, size large, was used. Chin strap- no. Humidifier used during study- yes.     Allyne Gee, MD, [facility] Diplomate ABMS Pulmonary, Critical Care and Sleep Medicine  Electronically reviewed and digitally signed

## 2019-11-11 ENCOUNTER — Ambulatory Visit: Payer: Self-pay | Admitting: *Deleted

## 2019-11-11 ENCOUNTER — Ambulatory Visit: Payer: Self-pay | Admitting: Family Medicine

## 2019-11-11 NOTE — Telephone Encounter (Signed)
To increase rest, increase fluid intake.  Can take over the counter elderberry, zinc and vitamin C.  If she is still within 10 days of symptom onset, can send her to the infusion clinic.  Can have her take a rapid home test if that would produce quicker results to get her in for the COVID infusion clinic.

## 2019-11-11 NOTE — Telephone Encounter (Signed)
I contacted the patient and notified her that her COVID test was not performed. There was a lab issue and now the test is void. I offered the patient to come in today to repeat the test. She said she will call around to see if she can get a rapid test today. If she don't find a location she will call back to the office to notify us that she need to come by to get that test repeated.

## 2019-11-11 NOTE — Telephone Encounter (Signed)
Caller requesting advise for dry sinuses and fatigue. C/o chills and sweating , cough. Recommended to call pharmacists to recommend nasal spray for dry sinuses and shower for warm mist . Care advise given for hydration. Awaiting covid results, still pending at this time. Patient verbalized understanding of care advise and to call back if symptoms worsen.  Reason for Disposition . COVID-19 Prevention and Healthy Living, questions about  Answer Assessment - Initial Assessment Questions 1. COVID-19 DIAGNOSIS: "Who made your Coronavirus (COVID-19) diagnosis?" "Was it confirmed by a positive lab test?" If not diagnosed by a HCP, ask "Are there lots of cases (community spread) where you live?" (See public health department website, if unsure)     Awaiting covid results 2. COVID-19 EXPOSURE: "Was there any known exposure to COVID before the symptoms began?" CDC Definition of close contact: within 6 feet (2 meters) for a total of 15 minutes or more over a 24-hour period.      na 3. ONSET: "When did the COVID-19 symptoms start?"      Friday 11/05/19 4. WORST SYMPTOM: "What is your worst symptom?" (e.g., cough, fever, shortness of breath, muscle aches)     Dry sinuses, fatigue 5. COUGH: "Do you have a cough?" If Yes, ask: "How bad is the cough?"       yes 6. FEVER: "Do you have a fever?" If Yes, ask: "What is your temperature, how was it measured, and when did it start?"     Chills and sweats 7. RESPIRATORY STATUS: "Describe your breathing?" (e.g., shortness of breath, wheezing, unable to speak)      na 8. BETTER-SAME-WORSE: "Are you getting better, staying the same or getting worse compared to yesterday?"  If getting worse, ask, "In what way?"     Worse  9. HIGH RISK DISEASE: "Do you have any chronic medical problems?" (e.g., asthma, heart or lung disease, weak immune system, obesity, etc.)     na 10. PREGNANCY: "Is there any chance you are pregnant?" "When was your last menstrual period?"        na 11. OTHER SYMPTOMS: "Do you have any other symptoms?"  (e.g., chills, fatigue, headache, loss of smell or taste, muscle pain, sore throat; new loss of smell or taste especially support the diagnosis of COVID-19)       Chills , fatigue  Protocols used: CORONAVIRUS (COVID-19) DIAGNOSED OR SUSPECTED-A-AH

## 2019-11-13 DIAGNOSIS — Z20822 Contact with and (suspected) exposure to covid-19: Secondary | ICD-10-CM | POA: Diagnosis not present

## 2019-11-16 ENCOUNTER — Encounter: Payer: Self-pay | Admitting: Family Medicine

## 2019-11-16 ENCOUNTER — Ambulatory Visit: Payer: Self-pay

## 2019-11-16 DIAGNOSIS — U071 COVID-19: Secondary | ICD-10-CM | POA: Insufficient documentation

## 2019-11-16 NOTE — Telephone Encounter (Signed)
Pt. Reports her COVID 19 retest came back positive. States she is feeling better. Still has headache and fatigue.Reviewed quarantine recommendations and home care. Reviewed when to seek medical attention. Verbalizes understanding.  Reason for Disposition . [1] [id number] diagnosed by positive lab test AND [2] mild symptoms (e.g., cough, fever, others) AND [4] no complications or SOB  Answer Assessment - Initial Assessment Questions 1. COVID-19 DIAGNOSIS: "Who made your Coronavirus (COVID-19) diagnosis?" "Was it confirmed by a positive lab test?" If not diagnosed by a HCP, ask "Are there lots of cases (community spread) where you live?" (See public health department website, if unsure)     [facility] 2. COVID-19 EXPOSURE: "Was there any known exposure to [place] before the symptoms began?" CDC Definition of close contact: within 6 feet (2 meters) for a total of 15 minutes or more over a 24-hour period.      No 3. ONSET: "When did the COVID-19 symptoms start?"      Last week 4. WORST SYMPTOM: "What is your worst symptom?" (e.g., cough, fever, shortness of breath, muscle aches)     Headache 5. COUGH: "Do you have a cough?" If Yes, ask: "How bad is the cough?"       No 6. FEVER: "Do you have a fever?" If Yes, ask: "What is your temperature, how was it measured, and when did it start?"     No 7. RESPIRATORY STATUS: "Describe your breathing?" (e.g., shortness of breath, wheezing, unable to speak)      No 8. BETTER-SAME-WORSE: "Are you getting better, staying the same or getting worse compared to yesterday?"  If getting worse, ask, "In what way?"     Better 9. HIGH RISK DISEASE: "Do you have any chronic medical problems?" (e.g., asthma, heart or lung disease, weak immune system, obesity, etc.)     No 10. PREGNANCY: "Is there any chance you are pregnant?" "When was your last menstrual period?"       No 11. OTHER SYMPTOMS: "Do you have any other symptoms?"  (e.g., chills, fatigue, headache, loss of smell or  taste, muscle pain, sore throat; new loss of smell or taste especially support the diagnosis of COVID-19)       Fatigue  Protocols used: CORONAVIRUS (COVID-19) DIAGNOSED OR SUSPECTED-A-AH

## 2019-11-17 ENCOUNTER — Other Ambulatory Visit: Payer: Self-pay | Admitting: Family Medicine

## 2019-11-17 ENCOUNTER — Encounter: Payer: Self-pay | Admitting: Family Medicine

## 2019-11-18 ENCOUNTER — Ambulatory Visit: Payer: Self-pay | Admitting: *Deleted

## 2019-11-18 NOTE — Telephone Encounter (Signed)
Patient tested + COVID 11/13/19- she wants to know what the rules are for returning to work. Advised per CDC guidelines.  Reason for Disposition  [1] COVID-19 diagnosed by positive lab test AND [2] NO symptoms (e.g., cough, fever, others)  Answer Assessment - Initial Assessment Questions 1. COVID-19 DIAGNOSIS: "Who made your Coronavirus (COVID-19) diagnosis?" "Was it confirmed by a positive lab test?" If not diagnosed by a HCP, ask "Are there lots of cases (community spread) where you live?" (See public health department website, if unsure)     + lab test-[facility] 2. COVID-19 EXPOSURE: "Was there any known exposure to COVID before the symptoms began?" CDC Definition of close contact: within 6 feet (2 meters) for a total of 15 minutes or more over a 24-hour period.      unknown 3. ONSET: "When did the COVID-19 symptoms start?"      9/16- had video visit Patient reports doing much better now.  Protocols used: CORONAVIRUS (COVID-19) DIAGNOSED OR SUSPECTED-A-AH

## 2019-11-29 ENCOUNTER — Encounter: Payer: Self-pay | Admitting: Family Medicine

## 2019-11-29 ENCOUNTER — Other Ambulatory Visit: Payer: Self-pay

## 2019-11-29 ENCOUNTER — Telehealth (INDEPENDENT_AMBULATORY_CARE_PROVIDER_SITE_OTHER): Payer: Medicare HMO | Admitting: Family Medicine

## 2019-11-29 DIAGNOSIS — R197 Diarrhea, unspecified: Secondary | ICD-10-CM | POA: Diagnosis not present

## 2019-11-29 DIAGNOSIS — K30 Functional dyspepsia: Secondary | ICD-10-CM | POA: Insufficient documentation

## 2019-11-29 MED ORDER — OMEPRAZOLE 20 MG PO CPDR: 20.0000 mg | DELAYED_RELEASE_CAPSULE | Freq: Two times a day (BID) | ORAL | 0 refills | Status: DC

## 2019-11-29 NOTE — Progress Notes (Signed)
Virtual Visit via MyChart Video Visit  The purpose of this virtual visit is to provide medical care while limiting exposure to the novel coronavirus (COVID19) for both patient and office staff.  Consent was obtained for phone visit:  Yes.   Answered questions that patient had about telehealth interaction:  Yes.   I discussed the limitations, risks, security and privacy concerns of performing an evaluation and management service by telephone. I also discussed with the patient that there may be a patient responsible charge related to this service. The patient expressed understanding and agreed to proceed.  Patient is at home and is accessed via [place] are provided by Abigail Rain, FNP-C from [facility])  ---------------------------------------------------------------------- Chief Complaint  Patient presents with  . Diarrhea    persistent diarrhea, indigisten,since of vomiting,  x 3 days. Pt reports that she changed her diet, she is doing the [place] diet. Pt was diagnose on 11/13/2019. She had a repeat COVID test on Saturday, 10/02 with negative COVID result      S: Reviewed CMA documentation. I have called patient and gathered additional HPI as follows:  Abigail Delacruz presents for MyChart Video Visit for concerns of persistent diarrhea with indigestion x 3 days with an episode of vomiting on Saturday morning.  Reports she has recently changed her diet to an [place] with increased broths and vegetables and pre-portioned snacks/meals.  Has not eaten any take out or food from a restaurant.  No one else in her home with similar symptoms.  Had been diagnosed with COVID-19 on 11/13/2019 and had repeat testing on 11/27/2019 and was negative.  Denies fevers, sore throat, change in taste/smell, SOB, CP, DOE.  Had taken water, lemon and some baking soda with some relief of her indigestion.  Had previously been on Omeprazole 20mg  BID but has recently  changed to taking this once per day, if needed.    Patient is currently home Denies any high risk travel to areas of current concern for COVID19. Denies any known or suspected exposure to person with or possibly with COVID19.  Past Medical History:  Diagnosis Date  . Cataract   . Hyperlipidemia    Social History   Tobacco Use  . Smoking status: Former Smoker    Quit date: 1974    Years since quitting: 47.7  . Smokeless tobacco: Never Used  Vaping Use  . Vaping Use: Never used  Substance Use Topics  . Alcohol use: Yes    Comment: social 1x/yr  . Drug use: Never    Current Outpatient Medications:  Marland Kitchen  Multiple Vitamins-Minerals (MULTIVITAMIN ADULT) CHEW, Chew by mouth., Disp: , Rfl:  .  atorvastatin (LIPITOR) 20 MG tablet, Take 1 tablet (20 mg total) by mouth daily. (Patient not taking: Reported on 11/29/2019), Disp: 90 tablet, Rfl: 3 .  omeprazole (PRILOSEC) 20 MG capsule, Take 1 capsule (20 mg total) by mouth 2 (two) times daily before a meal for 14 days., Disp: 28 capsule, Rfl: 0  Depression screen [facility] 2/9 07/27/2019 05/27/2019 08/26/2017  Decreased Interest 0 0 0  Down, Depressed, Hopeless 0 0 0  PHQ - 2 Score 0 0 0  Altered sleeping - - 0  Tired, decreased energy - - 0  Change in appetite - - 0  Feeling bad or failure about yourself  - - 0  Trouble concentrating - - 0  Moving slowly or fidgety/restless - - 0  Suicidal thoughts - - 0  PHQ-9 Score - -  0    No flowsheet data found.  -------------------------------------------------------------------------- O: No physical exam performed due to remote telephone encounter.  Physical Exam: Patient remotely monitored with video.  Verbal communication appropriate.  Cognition normal.   Recent Results (from the past 2160 hour(s))  CBC with Differential     Status: None   Collection Time: 09/09/19 10:08 AM  Result Value Ref Range   WBC 4.9 3.8 - 10.8 Thousand/uL   RBC 4.59 3.80 - 5.10 Million/uL   Hemoglobin 14.2 11.7 -  15.5 g/dL   HCT 43.2 35 - 45 %   MCV 94.1 80.0 - 100.0 fL   MCH 30.9 27.0 - 33.0 pg   MCHC 32.9 32.0 - 36.0 g/dL   RDW 12.8 11.0 - 15.0 %   Platelets 213 140 - 400 Thousand/uL   MPV 11.0 7.5 - 12.5 fL   Neutro Abs 2,587 1,500 - 7,800 cells/uL   Lymphs Abs 1,710 850 - 3,900 cells/uL   Absolute Monocytes 421 200 - 950 cells/uL   Eosinophils Absolute 132 15 - 500 cells/uL   Basophils Absolute 49 0 - 200 cells/uL   Neutrophils Relative % 52.8 %   Total Lymphocyte 34.9 %   Monocytes Relative 8.6 %   Eosinophils Relative 2.7 %   Basophils Relative 1.0 %  Rheumatoid factor     Status: None   Collection Time: 09/09/19 10:08 AM  Result Value Ref Range   Rhuematoid fact SerPl-aCnc <11 <94 IU/mL  Cyclic citrul peptide antibody, IgG     Status: None   Collection Time: 09/09/19 10:08 AM  Result Value Ref Range   Cyclic Citrullin Peptide Ab <16 UNITS    Comment: Reference Range Negative:            <20 Weak Positive:       20-39 Moderate Positive:   40-59 Strong Positive:     >59 .   Antinuclear Antib (ANA)     Status: Abnormal   Collection Time: 09/09/19 10:08 AM  Result Value Ref Range   Anti Nuclear Antibody (ANA) POSITIVE (A) NEGATIVE    Comment: ANA IFA is a first line screen for detecting the presence of up to approximately 150 autoantibodies in various autoimmune diseases. A positive ANA IFA result is suggestive of autoimmune disease and reflexes to titer and pattern. Further laboratory testing may be considered if clinically indicated. . For additional information, please refer to http://education.[facility].com/faq/FAQ177 (This link is being provided for informational/ educational purposes only.) .   C-reactive protein     Status: None   Collection Time: 09/09/19 10:08 AM  Result Value Ref Range   CRP 2.0 <8.0 mg/L  Sedimentation rate     Status: None   Collection Time: 09/09/19 10:08 AM  Result Value Ref Range   Sed Rate 9 0 - 30 mm/h  Anti-nuclear ab-titer  (ANA titer)     Status: Abnormal   Collection Time: 09/09/19 10:08 AM  Result Value Ref Range   ANA Titer 1 1:160 (H) titer    Comment:                 Reference Range                 <1:40        Negative                 1:40-1:80    Low Antibody Level                 >  1:80        Elevated Antibody Level .    ANA Pattern 1 Nuclear, Speckled (A)     Comment: Speckled pattern is associated with mixed connective tissue disease (MCTD), systemic lupus erythematosus (SLE), Sjogren's syndrome, dermatomyositis, and  systemic sclerosis/polymyositis overlap. . AC-2,4,5,29: Speckled . International Consensus on ANA Patterns (https://www.hernandez-brewer.com/)    ANA TITER 1:80 (H) titer    Comment: A low level ANA titer may be present in pre-clinical autoimmune diseases and normal individuals.                 Reference Range                 <1:40        Negative                 1:40-1:80    Low Antibody Level                 >1:80        Elevated Antibody Level .    ANA PATTERN Cytoplasmic (A)     Comment: The presence of cytoplasmic fluorescence was noted on the HEp-2 slide. Other reactivities (e.g., anti- mitochondrial antibodies or anti-smooth muscle antibodies) may be responsible for this fluorescence. The clinical significance of this finding is uncertain. Clinical correlation is recommended. . AC-15 to AC-23: Cytoplasmic . International Consensus on ANA Patterns (https://www.hernandez-brewer.com/)     -------------------------------------------------------------------------- A&P:  Problem List Items Addressed This Visit      Other   Diarrhea - Primary    Likely secondary to [employer] dietary changes, as listed on their site that side effects can include leg cramps, dizziness, fatigue, headaches, loose skin, hair loss, rashes, gas, diarrhea, bad breath, gallstones or gallbladder disease, and constipation.  Will have labs drawn for evaluation of electrolytes  given the 3 day history of diarrhea with 1 episode of vomiting.  Encourage to restart on the omeprazole 20mg  1-2x per day for the indigestion and take a break from [facility] for 3-4 days and look for change in symptoms.  Can take over the counter pepto bismal and/or immodium to help slow down symptoms.  Plan: 1. Have labs drawn 2. Restart on the omeprazole 20mg , 1-2x per day 3. Take a break from the [employer] dietary changes for 3-4 days 4. Can take OTC pepto bismal and/or immodium to help with slowing down diarrhea 5. RTC if symptoms worsen or fail to improve      Relevant Orders   CBC with Differential   COMPLETE METABOLIC PANEL WITH GFR   Indigestion    See diarrhea A/P      Relevant Medications   omeprazole (PRILOSEC) 20 MG capsule      Meds ordered this encounter  Medications  . omeprazole (PRILOSEC) 20 MG capsule    Sig: Take 1 capsule (20 mg total) by mouth 2 (two) times daily before a meal for 14 days.    Dispense:  28 capsule    Refill:  0    Follow-up: - Return if symptoms worsen or fail to improve  Patient verbalizes understanding with the above medical recommendations including the limitation of remote medical advice.  Specific follow-up and call-back criteria were given for patient to follow-up or seek medical care more urgently if needed.  - Time spent in direct consultation with patient on video: 12 minutes  Abigail Delacruz, [facility] 11/29/2019, 11:48 AM

## 2019-11-29 NOTE — Assessment & Plan Note (Signed)
See diarrhea A/P 

## 2019-11-29 NOTE — Patient Instructions (Addendum)
Diarrhea is likely secondary to [place] dietary changes, as listed on their site that side effects can include leg cramps, dizziness, fatigue, headaches, loose skin, hair loss, rashes, gas, diarrhea, bad breath, gallstones or gallbladder disease, and constipation.   Have your labs drawn in the next few days for evaluation of your electrolytes.  Labs typically take 1-2 days to result and we will be in touch once they have resulted.  Can take over the counter Pepto Bismal and/or Immodium to help slow down the diarrhea.  I would encourage holding off on the [employer] dietary changes for a few days (3-4) to see if your symptoms resolve on their own.  Restart the omeprazole 20mg , take 1-2 tablets daily.  After being on this medication for 4 days, we will see if this is helping alleviate the indigestion.  We will plan to see you back if your symptoms worsen or fail to improve  You will receive a survey after today's visit either digitally by e-mail or paper by USPS mail. Your experiences and feedback matter to [place].  Please respond so we know how we are doing as we provide care for you.  Call us with any questions/concerns/needs.  It is my goal to be available to you for your health concerns.  Thanks for choosing me to be a partner in your healthcare needs!  Harlin Rain, FNP-C Family Nurse Practitioner [facility] Phone: [phone]

## 2019-11-29 NOTE — Assessment & Plan Note (Signed)
Likely secondary to [place] dietary changes, as listed on their site that side effects can include leg cramps, dizziness, fatigue, headaches, loose skin, hair loss, rashes, gas, diarrhea, bad breath, gallstones or gallbladder disease, and constipation.  Will have labs drawn for evaluation of electrolytes given the 3 day history of diarrhea with 1 episode of vomiting.  Encourage to restart on the omeprazole 20mg  1-2x per day for the indigestion and take a break from [facility] for 3-4 days and look for change in symptoms.  Can take over the counter pepto bismal and/or immodium to help slow down symptoms.  Plan: 1. Have labs drawn 2. Restart on the omeprazole 20mg , 1-2x per day 3. Take a break from the [employer] dietary changes for 3-4 days 4. Can take OTC pepto bismal and/or immodium to help with slowing down diarrhea 5. RTC if symptoms worsen or fail to improve

## 2019-11-30 DIAGNOSIS — R197 Diarrhea, unspecified: Secondary | ICD-10-CM | POA: Diagnosis not present

## 2019-12-01 LAB — CBC WITH DIFFERENTIAL/PLATELET
Absolute Monocytes: 459 cells/uL (ref 200–950)
Basophils Absolute: 41 cells/uL (ref 0–200)
Basophils Relative: 1 %
Eosinophils Absolute: 152 cells/uL (ref 15–500)
Eosinophils Relative: 3.7 %
HCT: 41 % (ref 35.0–45.0)
Hemoglobin: 13.6 g/dL (ref 11.7–15.5)
Lymphs Abs: 1611 cells/uL (ref 850–3900)
MCH: 31 pg (ref 27.0–33.0)
MCHC: 33.2 g/dL (ref 32.0–36.0)
MCV: 93.4 fL (ref 80.0–100.0)
MPV: 11.9 fL (ref 7.5–12.5)
Monocytes Relative: 11.2 %
Neutro Abs: 1837 cells/uL (ref 1500–7800)
Neutrophils Relative %: 44.8 %
Platelets: 232 10*3/uL (ref 140–400)
RBC: 4.39 10*6/uL (ref 3.80–5.10)
RDW: 13.1 % (ref 11.0–15.0)
Total Lymphocyte: 39.3 %
WBC: 4.1 10*3/uL (ref 3.8–10.8)

## 2019-12-01 LAB — COMPLETE METABOLIC PANEL WITH GFR
AG Ratio: 1.6 (calc) (ref 1.0–2.5)
ALT: 29 U/L (ref 6–29)
AST: 25 U/L (ref 10–35)
Albumin: 4 g/dL (ref 3.6–5.1)
Alkaline phosphatase (APISO): 96 U/L (ref 37–153)
BUN: 25 mg/dL (ref 7–25)
CO2: 28 mmol/L (ref 20–32)
Calcium: 9.3 mg/dL (ref 8.6–10.4)
Chloride: 100 mmol/L (ref 98–110)
Creat: 0.73 mg/dL (ref 0.50–0.99)
GFR, Est African American: 97 mL/min/{1.73_m2} (ref >=60)
GFR, Est Non African American: 84 mL/min/{1.73_m2} (ref >=60)
Globulin: 2.5 g/dL (calc) (ref 1.9–3.7)
Glucose, Bld: 100 mg/dL — ABNORMAL HIGH (ref 65–99)
Potassium: 3.8 mmol/L (ref 3.5–5.3)
Sodium: 141 mmol/L (ref 135–146)
Total Bilirubin: 0.6 mg/dL (ref 0.2–1.2)
Total Protein: 6.5 g/dL (ref 6.1–8.1)

## 2019-12-12 DIAGNOSIS — Z20822 Contact with and (suspected) exposure to covid-19: Secondary | ICD-10-CM | POA: Diagnosis not present

## 2020-02-10 DIAGNOSIS — R69 Illness, unspecified: Secondary | ICD-10-CM | POA: Diagnosis not present

## 2020-02-11 DIAGNOSIS — H43813 Vitreous degeneration, bilateral: Secondary | ICD-10-CM | POA: Diagnosis not present

## 2020-02-21 DIAGNOSIS — Z20822 Contact with and (suspected) exposure to covid-19: Secondary | ICD-10-CM | POA: Diagnosis not present

## 2020-05-26 ENCOUNTER — Other Ambulatory Visit: Payer: Self-pay | Admitting: Nurse Practitioner

## 2020-05-26 DIAGNOSIS — E782 Mixed hyperlipidemia: Secondary | ICD-10-CM

## 2020-05-26 NOTE — Telephone Encounter (Signed)
Requested medication (s) are due for refill today:   Yes  Requested medication (s) are on the active medication list:   Yes  Future visit scheduled:   No   Last ordered: 09/23/2019 #90, 3 refills by [facility] note:  Returned because not been seen by another provider since [place] left.   Also failed protocol due to labs due.   Provider to review for refill   Requested Prescriptions  Pending Prescriptions Disp Refills   atorvastatin (LIPITOR) 20 MG tablet [Pharmacy Med Name: Atorvastatin Calcium 20 MG Oral Tablet] 90 tablet 0    Sig: Take 1 tablet by mouth once daily      Cardiovascular:  Antilipid - Statins Failed - 05/26/2020  9:37 AM      Failed - Total Cholesterol in normal range and within 360 days    Cholesterol  Date Value Ref Range Status  05/27/2019 138 <200 mg/dL Final          Failed - LDL in normal range and within 360 days    LDL Cholesterol (Calc)  Date Value Ref Range Status  05/27/2019 66 mg/dL (calc) Final    Comment:    Reference range: <100 . Desirable range <100 mg/dL for primary prevention;   <70 mg/dL for patients with CHD or diabetic patients  with > or = 2 CHD risk factors. Marland Kitchen LDL-C is now calculated using the Martin-Hopkins  calculation, which is a validated novel method providing  better accuracy than the Friedewald equation in the  estimation of LDL-C.  Cresenciano Genre et al. Annamaria Helling. 1448;185(63): 2061-2068  (http://education.[facility].com/faq/FAQ164)           Failed - HDL in normal range and within 360 days    HDL  Date Value Ref Range Status  05/27/2019 53 > OR = 50 mg/dL Final          Failed - Triglycerides in normal range and within 360 days    Triglycerides  Date Value Ref Range Status  05/27/2019 106 <150 mg/dL Final          Passed - Patient is not pregnant      Passed - Valid encounter within last 12 months    Recent Outpatient Visits           5 months ago Diarrhea, unspecified type   [facility], Lupita Raider, FNP   6 months ago Cough   [facility], Lupita Raider, FNP   8 months ago Arthralgia of left hand   [place], FNP   1 year ago Pain of upper abdomen   [place], [place]   1 year ago Encounter for hepatitis C screening test for low risk patient   [facility] Mikey [employer], NP       Future Appointments             In 2 months [facility], [facility]

## 2020-06-26 ENCOUNTER — Ambulatory Visit (INDEPENDENT_AMBULATORY_CARE_PROVIDER_SITE_OTHER): Payer: Medicare HMO | Admitting: Internal Medicine

## 2020-06-26 ENCOUNTER — Encounter: Payer: Self-pay | Admitting: Internal Medicine

## 2020-06-26 ENCOUNTER — Other Ambulatory Visit: Payer: Self-pay

## 2020-06-26 VITALS — BP 147/66 | HR 67 | Temp 97.7°F | Wt 184.0 lb

## 2020-06-26 DIAGNOSIS — H9202 Otalgia, left ear: Secondary | ICD-10-CM | POA: Diagnosis not present

## 2020-06-26 DIAGNOSIS — E782 Mixed hyperlipidemia: Secondary | ICD-10-CM

## 2020-06-26 DIAGNOSIS — K219 Gastro-esophageal reflux disease without esophagitis: Secondary | ICD-10-CM

## 2020-06-26 MED ORDER — OMEPRAZOLE 20 MG PO CPDR
20.0000 mg | DELAYED_RELEASE_CAPSULE | Freq: Every day | ORAL | 1 refills | Status: DC
Start: 2020-06-26 — End: 2020-12-04

## 2020-06-26 NOTE — Assessment & Plan Note (Signed)
Avoid foods that trigger your reflux Encouraged weight loss as this can help reduce reflux symptoms Omeprazole refilled today

## 2020-06-26 NOTE — Assessment & Plan Note (Signed)
Will check CMET and lipid profile at [facility] Encouraged her to consume a low fat diet Continue Atorvastatin

## 2020-06-26 NOTE — Progress Notes (Signed)
Subjective:    Patient ID: Abigail Delacruz, female    DOB: [birth year], 70 y.o.   MRN: [id number]  HPI  Pt presents to the clinic today with c/o left ear pain. This started 5 days ago. She describes the pain as achy and pulsating. She denies drainage, hearing loss, ringing in the ears. The pain radiates into the left side of her throat. She denies headache, dizziness, visual changes, runny nose, nasal congestion, sore throat or cough. She denies fever, chills or body aches. She did fly back from [place] on Thursday and thinks the flight could have caused an inner ear problem. She has taken Ibuprofen OTC with some relief of symptoms.   She is also due to follow up chronic conditions. She is establishing care with me today, transferring care from Cyndia Skeeters, NP.  GERD: Intermittent. She takes Omeprazole as needed with good relief of symptoms. She does need a refill today. There is no upper GI on file.   HLD: Her last LDL was 66, triglycerides 106, 05/2019. She denies myalgias on Atorvastatin. She tries to consume a low fat diet.  Review of Systems  Past Medical History:  Diagnosis Date  . Cataract   . Hyperlipidemia     Current Outpatient Medications  Medication Sig Dispense Refill  . atorvastatin (LIPITOR) 20 MG tablet Take 1 tablet (20 mg total) by mouth daily. 90 tablet 0  . Multiple Vitamins-Minerals (MULTIVITAMIN ADULT) CHEW Chew by mouth.    Marland Kitchen omeprazole (PRILOSEC) 20 MG capsule Take 1 capsule (20 mg total) by mouth 2 (two) times daily before a meal for 14 days. 28 capsule 0   No current facility-administered medications for this visit.    No Known Allergies  Family History  Problem Relation Age of Onset  . Kidney failure Mother   . Cirrhosis Father   . Alcohol abuse Father   . Diabetes Brother   . Hypertension Brother   . Arthritis Brother   . Breast cancer Neg Hx     Social History   Socioeconomic History  . Marital status: Married    Spouse name: Not on  file  . Number of children: 2  . Years of education: Not on file  . Highest education level: Some college, no degree  Occupational History  . Not on file  Tobacco Use  . Smoking status: Former Smoker    Quit date: 1974    Years since quitting: 48.3  . Smokeless tobacco: Never Used  Vaping Use  . Vaping Use: Never used  Substance and Sexual Activity  . Alcohol use: Yes    Comment: social 1x/yr  . Drug use: Never  . Sexual activity: Not Currently  Other Topics Concern  . Not on file  Social History Narrative  . Not on file   Social Determinants of Health   Financial Resource Strain: Low Risk   . Difficulty of Paying Living Expenses: Not hard at all  Food Insecurity: No Food Insecurity  . Worried About Charity fundraiser in the Last Year: Never true  . Ran Out of Food in the Last Year: Never true  Transportation Needs: No Transportation Needs  . Lack of Transportation (Medical): No  . Lack of Transportation (Non-Medical): No  Physical Activity: Inactive  . Days of Exercise per Week: 0 days  . Minutes of Exercise per Session: 0 min  Stress: Not on file  Social Connections: Socially Integrated  . Frequency of Communication with Friends and Family: More  than three times a week  . Frequency of Social Gatherings with Friends and Family: More than three times a week  . Attends Religious Services: More than 4 times per year  . Active Member of Clubs or Organizations: Yes  . Attends Archivist Meetings: More than 4 times per year  . Marital Status: Married  Human resources officer Violence: Not on file     Constitutional: Denies fever, malaise, fatigue, headache or abrupt weight changes.  HEENT: Pt reports left ear pain. Denies eye pain, eye redness, ringing in the ears, wax buildup, runny nose, nasal congestion, bloody nose, or sore throat. Respiratory: Denies difficulty breathing, shortness of breath, cough or sputum production.   Cardiovascular: Denies chest pain, chest  tightness, palpitations or swelling in the hands or feet.  Gastrointestinal: Pt reports intermittent reflux. Denies abdominal pain, bloating, constipation, diarrhea or blood in the stool.  Musculoskeletal: Denies decrease in range of motion, difficulty with gait, muscle pain or joint pain and swelling.  Skin: Denies redness, rashes, lesions or ulcercations.  Neurological: Denies dizziness, difficulty with memory, difficulty with speech or problems with balance and coordination.   No other specific complaints in a complete review of systems (except as listed in HPI above).     Objective:   Physical Exam  BP (!) 147/66 (BP Location: Left Arm, Patient Position: Sitting, Cuff Size: Normal)   Pulse 67   Temp 97.7 F (36.5 C) (Oral)   Wt 184 lb (83.5 kg)   SpO2 100%   BMI 34.48 kg/m  Wt Readings from Last 3 Encounters:  06/26/20 184 lb (83.5 kg)  09/09/19 186 lb 3.2 oz (84.5 kg)  07/27/19 182 lb 12.8 oz (82.9 kg)    General: Appears her stated age,obese, in NAD. HEENT: Head: normal shape and size; Eyes: sclera white and EOMs intact; Ears: Tm's gray and intact, normal light reflex, no effusion noted;  Neck:  No adenopathy noted. Cardiovascular: Normal rate Pulmonary/Chest: Normal effort. Abdomen:  Active bowel sounds.  Neurological: Alert and oriented.   BMET    Component Value Date/Time   NA 141 11/30/2019 0810   K 3.8 11/30/2019 0810   CL 100 11/30/2019 0810   CO2 28 11/30/2019 0810   GLUCOSE 100 (H) 11/30/2019 0810   BUN 25 11/30/2019 0810   CREATININE 0.73 11/30/2019 0810   CALCIUM 9.3 11/30/2019 0810   GFRNONAA 84 11/30/2019 0810   GFRAA 97 11/30/2019 0810    Lipid Panel     Component Value Date/Time   CHOL 138 05/27/2019 0905   TRIG 106 05/27/2019 0905   HDL 53 05/27/2019 0905   CHOLHDL 2.6 05/27/2019 0905   LDLCALC 66 05/27/2019 0905    CBC    Component Value Date/Time   WBC 4.1 11/30/2019 0810   RBC 4.39 11/30/2019 0810   HGB 13.6 11/30/2019 0810    HCT 41.0 11/30/2019 0810   PLT 232 11/30/2019 0810   MCV 93.4 11/30/2019 0810   MCH 31.0 11/30/2019 0810   MCHC 33.2 11/30/2019 0810   RDW 13.1 11/30/2019 0810   LYMPHSABS 1,611 11/30/2019 0810   EOSABS 152 11/30/2019 0810   BASOSABS 41 11/30/2019 0810    Hgb A1C Lab Results  Component Value Date   HGBA1C CANCELED 09/18/2018           Assessment & Plan:   Otalgia, Left:  No evidence of infection or discrete ETD Will trial Flonase 1 spray left nostril BID x 7 days Ok to continue Ibuprofen/Advil OTC as  needed for pain  Make an appt in 1 month for your Medicare Wellness Exam  Webb Silversmith, NP This visit occurred during the SARS-CoV-2 public health emergency.  Safety protocols were in place, including screening questions prior to the visit, additional usage of staff PPE, and extensive cleaning of exam room while observing appropriate contact time as indicated for disinfecting solutions.

## 2020-06-26 NOTE — Patient Instructions (Addendum)
Use Flonase 1 spray left nostril 2 x daily x 1 week for left ear pain. No evidence of infection. You can take Ibuprofen/Advil OTC as needed for pain as well.      Ear Barotrauma, Adult  Ear barotrauma is an injury to the middle ear. This happens when there is a change in air pressure. What are the causes?  Flying in an airplane.  Going deeper or coming to the surface too quickly when scuba diving.  Going to a high place (high altitude) quickly.  Being too close to an explosion or blast. What increases the risk?  An infection in your middle ear.  A sinus infection.  A cold.  An allergy to something around you.  Having small tubes that lead from the middle ear to the back of the nose (eustachian tubes).  Recent ear surgery. What are the signs or symptoms? Symptoms of this condition include:  Ear pain.  Sudden loss of hearing. This may be partial or complete.  A feeling that your ear is clogged.  Ringing in your ears. Other symptoms may include:  Dizziness that creates a spinning, rocking, or tumbling feeling.  Loss of balance.  Feeling like you may vomit (nausea).  Headache.  Pain in your face. How is this treated?  Medicines.  Using a method to "pop" your ears or make the pressure in the middle ear the same as in the outer ear. Ways to do this include: ? Yawning. ? Chewing gum. ? Swallowing. ? Holding your nose closed and gently blowing.  Surgery. This is done in very bad cases. Follow these instructions at home: Medicines  Take over-the-counter and prescription medicines only as told by your doctor.  Do not put anything into your ears to clean or unplug them. Activity Until your doctor says it is okay:  Do not travel to high places.  Do not fly in an airplane.  Do not scuba dive. How is this prevented?  Avoid doing things that cause pressure changes when you have symptoms of a cold or a stuffy nose.  If you have a stuffy nose and will  be flying, use a medicine that helps a stuffy nose (nasal decongestant) about 30-60 minutes before flying.  When you fly: ? Chew gum. ? Swallow often and swallow hard during takeoff and landing.  Hold your nose closed and gently blow to "pop" your ears.  Yawn during air pressure changes.  If scuba diving, dive feet first and "pop" your ears often as you go deeper in the water. Contact a doctor if:  Your symptoms get worse.  Your symptoms do not get better.  You have new symptoms.  You have a fever. Get help right away if:  You have very bad ear pain.  You have a very bad headache.  You are very dizzy.  You have blood or pus coming from your ear.  You have very bad balance problems.  You cannot move or feel part of your face. Summary  Ear barotrauma is an injury to the middle ear.  This condition may be treated with medicines or by "popping" your ears.  You can take steps to help prevent this condition. This information is not intended to replace advice given to you by your health care provider. Make sure you discuss any questions you have with your health care provider. Document Revised: 05/24/2019 Document Reviewed: 05/24/2019 Elsevier Patient Education  [employer].

## 2020-06-28 ENCOUNTER — Telehealth: Payer: Self-pay

## 2020-06-28 ENCOUNTER — Encounter: Payer: Self-pay | Admitting: Internal Medicine

## 2020-06-28 NOTE — Telephone Encounter (Signed)
Copied from [phone]. Topic: General - Call Back - No Documentation >> Jun 28, 2020 11:43 AM Loma [place] wrote: Pt is no better from Monday's visit, left ear is swelling, draining, and she is dizzy. Pt lymph nodes are left side of neck. Pt is taking several Advil in day and it just hurts. Please Fu and advice.  [phone]

## 2020-06-28 NOTE — Telephone Encounter (Signed)
I recommend she be seen for reevaluation

## 2020-06-29 ENCOUNTER — Ambulatory Visit (INDEPENDENT_AMBULATORY_CARE_PROVIDER_SITE_OTHER): Payer: Medicare HMO | Admitting: Internal Medicine

## 2020-06-29 ENCOUNTER — Other Ambulatory Visit: Payer: Self-pay

## 2020-06-29 ENCOUNTER — Encounter: Payer: Self-pay | Admitting: Internal Medicine

## 2020-06-29 VITALS — BP 133/76 | HR 91 | Temp 98.9°F | Ht 61.5 in | Wt 184.8 lb

## 2020-06-29 DIAGNOSIS — H60392 Other infective otitis externa, left ear: Secondary | ICD-10-CM

## 2020-06-29 MED ORDER — DEXAMETHASONE SODIUM PHOSPHATE 10 MG/ML IJ SOLN
10.0000 mg | Freq: Once | INTRAMUSCULAR | Status: AC
  Administered 2020-06-29: 10 mg via INTRAMUSCULAR

## 2020-06-29 MED ORDER — CEFTRIAXONE SODIUM 500 MG IJ SOLR
500.0000 mg | Freq: Once | INTRAMUSCULAR | Status: AC
  Administered 2020-06-29: 500 mg via INTRAMUSCULAR

## 2020-06-29 MED ORDER — TRAMADOL HCL 50 MG PO TABS: 50.0000 mg | ORAL_TABLET | Freq: Three times a day (TID) | ORAL | 0 refills | Status: DC | PRN

## 2020-06-29 MED ORDER — CIPROFLOXACIN-DEXAMETHASONE 0.3-0.1 % OT SUSP: 4.0000 [drp] | Freq: Two times a day (BID) | OTIC | 0 refills | Status: DC

## 2020-06-29 NOTE — Patient Instructions (Signed)

## 2020-06-29 NOTE — Telephone Encounter (Signed)
Appt scheduled for the patient to come in today.

## 2020-06-29 NOTE — Progress Notes (Signed)
Subjective:    Patient ID: Abigail Delacruz, female    DOB: [birth year], 70 y.o.   MRN: [id number]  HPI  Pt presents to the clinic today for follow up of left ear pain. This started 8 days ago after a flight coming back from [place]. It was felt this was related to changes in barometric pressure and she was advised to try Flonase OTC. She now reports swelling, drainage and dizziness.  She describes the pain as throbbing.  The pain radiates into the left side of her neck.  She reports no relief with Flonase and has not tried any other medications OTC for this.  Review of Systems      Past Medical History:  Diagnosis Date  . Cataract   . Hyperlipidemia     Current Outpatient Medications  Medication Sig Dispense Refill  . atorvastatin (LIPITOR) 20 MG tablet Take 1 tablet (20 mg total) by mouth daily. 90 tablet 0  . Multiple Vitamins-Minerals (MULTIVITAMIN ADULT) CHEW Chew by mouth.    Marland Kitchen omeprazole (PRILOSEC) 20 MG capsule Take 1 capsule (20 mg total) by mouth daily for 14 days. 90 capsule 1   No current facility-administered medications for this visit.    No Known Allergies  Family History  Problem Relation Age of Onset  . Kidney failure Mother   . Cirrhosis Father   . Alcohol abuse Father   . Diabetes Brother   . Hypertension Brother   . Arthritis Brother   . Breast cancer Neg Hx     Social History   Socioeconomic History  . Marital status: Married    Spouse name: Not on file  . Number of children: 2  . Years of education: Not on file  . Highest education level: Some college, no degree  Occupational History  . Not on file  Tobacco Use  . Smoking status: Former Smoker    Quit date: 1974    Years since quitting: 48.3  . Smokeless tobacco: Never Used  Vaping Use  . Vaping Use: Never used  Substance and Sexual Activity  . Alcohol use: Yes    Comment: social 1x/yr  . Drug use: Never  . Sexual activity: Not Currently  Other Topics Concern  . Not on file   Social History Narrative  . Not on file   Social Determinants of Health   Financial Resource Strain: Low Risk   . Difficulty of Paying Living Expenses: Not hard at all  Food Insecurity: No Food Insecurity  . Worried About Charity fundraiser in the Last Year: Never true  . Ran Out of Food in the Last Year: Never true  Transportation Needs: No Transportation Needs  . Lack of Transportation (Medical): No  . Lack of Transportation (Non-Medical): No  Physical Activity: Inactive  . Days of Exercise per Week: 0 days  . Minutes of Exercise per Session: 0 min  Stress: Not on file  Social Connections: Socially Integrated  . Frequency of Communication with Friends and Family: More than three times a week  . Frequency of Social Gatherings with Friends and Family: More than three times a week  . Attends Religious Services: More than 4 times per year  . Active Member of Clubs or Organizations: Yes  . Attends Archivist Meetings: More than 4 times per year  . Marital Status: Married  Human resources officer Violence: Not on file     Constitutional: Denies fever, malaise, fatigue, headache or abrupt weight changes.  HEENT: Pt  reports left ear pain, swelling and drainage. Denies eye pain, eye redness,  ringing in the ears, wax buildup, runny nose, nasal congestion, bloody nose, or sore throat. Respiratory: Denies difficulty breathing, shortness of breath, cough or sputum production.   Cardiovascular: Denies chest pain, chest tightness, palpitations or swelling in the hands or feet.  Neurological: Pt reports intermittent dizziness. Denies difficulty with memory, difficulty with speech or problems with balance and coordination.    No other specific complaints in a complete review of systems (except as listed in HPI above).  Objective:   Physical Exam   BP 133/76 (BP Location: Right Arm, Patient Position: Sitting, Cuff Size: Large)   Pulse 91   Temp 98.9 F (37.2 C) (Temporal)   Ht 5'  1.5" (1.562 m)   Wt 184 lb 12.8 oz (83.8 kg)   BMI 34.35 kg/m   Wt Readings from Last 3 Encounters:  06/26/20 184 lb (83.5 kg)  09/09/19 186 lb 3.2 oz (84.5 kg)  07/27/19 182 lb 12.8 oz (82.9 kg)    General: Appears her stated age, appears in pain but in NAD. HEENT: Head: normal shape and size; Eyes: sclera white and EOMs intact; Left Ear: Marked swelling of the left external ear. Canal patent with discharge noted in the ear canal. TM intact, normal light reflex, no effusion noted. Neck:  No adenopathy but tender to palpation on the left side of her neck. Cardiovascular: Normal rate Pulmonary/Chest: Normal effort Neurological: Alert and oriented.  Coordination normal.   BMET    Component Value Date/Time   NA 141 11/30/2019 0810   K 3.8 11/30/2019 0810   CL 100 11/30/2019 0810   CO2 28 11/30/2019 0810   GLUCOSE 100 (H) 11/30/2019 0810   BUN 25 11/30/2019 0810   CREATININE 0.73 11/30/2019 0810   CALCIUM 9.3 11/30/2019 0810   GFRNONAA 84 11/30/2019 0810   GFRAA 97 11/30/2019 0810    Lipid Panel     Component Value Date/Time   CHOL 138 05/27/2019 0905   TRIG 106 05/27/2019 0905   HDL 53 05/27/2019 0905   CHOLHDL 2.6 05/27/2019 0905   LDLCALC 66 05/27/2019 0905    CBC    Component Value Date/Time   WBC 4.1 11/30/2019 0810   RBC 4.39 11/30/2019 0810   HGB 13.6 11/30/2019 0810   HCT 41.0 11/30/2019 0810   PLT 232 11/30/2019 0810   MCV 93.4 11/30/2019 0810   MCH 31.0 11/30/2019 0810   MCHC 33.2 11/30/2019 0810   RDW 13.1 11/30/2019 0810   LYMPHSABS 1,611 11/30/2019 0810   EOSABS 152 11/30/2019 0810   BASOSABS 41 11/30/2019 0810    Hgb A1C Lab Results  Component Value Date   HGBA1C CANCELED 09/18/2018          Assessment & Plan:   Infective Otitis Externa, Left:  Decadron 10 mg IM x 1 Rocephin 1 gm IM x 1 No indication for wick placement at this time Rx for Ciprodex 4 drops left ear BID x 7 days RX for Tramadol 50 mg TID prn for severe  pain  Return/UC precautions discussed  Webb Silversmith, NP This visit occurred during the SARS-CoV-2 public health emergency.  Safety protocols were in place, including screening questions prior to the visit, additional usage of staff PPE, and extensive cleaning of exam room while observing appropriate contact time as indicated for disinfecting solutions.

## 2020-06-30 ENCOUNTER — Encounter: Payer: Self-pay | Admitting: Emergency Medicine

## 2020-06-30 ENCOUNTER — Inpatient Hospital Stay: Payer: Medicare HMO

## 2020-06-30 ENCOUNTER — Ambulatory Visit: Payer: Self-pay | Admitting: *Deleted

## 2020-06-30 ENCOUNTER — Inpatient Hospital Stay
Admission: EM | Admit: 2020-06-30 | Discharge: 2020-07-04 | DRG: 155 | Disposition: A | Discharge status: Home / Self Care | Payer: Medicare HMO | Attending: Internal Medicine | Admitting: Internal Medicine

## 2020-06-30 ENCOUNTER — Emergency Department: Payer: Medicare HMO

## 2020-06-30 DIAGNOSIS — B0221 Postherpetic geniculate ganglionitis: Secondary | ICD-10-CM | POA: Diagnosis present

## 2020-06-30 DIAGNOSIS — E782 Mixed hyperlipidemia: Secondary | ICD-10-CM | POA: Diagnosis present

## 2020-06-30 DIAGNOSIS — E785 Hyperlipidemia, unspecified: Secondary | ICD-10-CM | POA: Diagnosis present

## 2020-06-30 DIAGNOSIS — K219 Gastro-esophageal reflux disease without esophagitis: Secondary | ICD-10-CM | POA: Diagnosis present

## 2020-06-30 DIAGNOSIS — Z20822 Contact with and (suspected) exposure to covid-19: Secondary | ICD-10-CM | POA: Diagnosis present

## 2020-06-30 DIAGNOSIS — G510 Bell's palsy: Secondary | ICD-10-CM | POA: Diagnosis present

## 2020-06-30 DIAGNOSIS — Z79899 Other long term (current) drug therapy: Secondary | ICD-10-CM

## 2020-06-30 DIAGNOSIS — L03211 Cellulitis of face: Secondary | ICD-10-CM | POA: Diagnosis present

## 2020-06-30 DIAGNOSIS — H60512 Acute actinic otitis externa, left ear: Secondary | ICD-10-CM | POA: Diagnosis present

## 2020-06-30 DIAGNOSIS — Z888 Allergy status to other drugs, medicaments and biological substances status: Secondary | ICD-10-CM | POA: Diagnosis not present

## 2020-06-30 DIAGNOSIS — Z87891 Personal history of nicotine dependence: Secondary | ICD-10-CM

## 2020-06-30 DIAGNOSIS — Z881 Allergy status to other antibiotic agents status: Secondary | ICD-10-CM

## 2020-06-30 LAB — BASIC METABOLIC PANEL
Anion gap: 8 (ref 5–15)
BUN: 19 mg/dL (ref 8–23)
CO2: 27 mmol/L (ref 22–32)
Calcium: 9.2 mg/dL (ref 8.9–10.3)
Chloride: 102 mmol/L (ref 98–111)
Creatinine, Ser: 0.64 mg/dL (ref 0.44–1.00)
GFR, Estimated: >60 mL/min (ref >60)
Glucose, Bld: 105 mg/dL — ABNORMAL HIGH (ref 70–99)
Potassium: 3.8 mmol/L (ref 3.5–5.1)
Sodium: 137 mmol/L (ref 135–145)

## 2020-06-30 LAB — CBC WITH DIFFERENTIAL/PLATELET
Abs Immature Granulocytes: 0.03 10*3/uL (ref 0.00–0.07)
Basophils Absolute: 0 10*3/uL (ref 0.0–0.1)
Basophils Relative: 0 %
Eosinophils Absolute: 0 10*3/uL (ref 0.0–0.5)
Eosinophils Relative: 0 %
HCT: 41 % (ref 36.0–46.0)
Hemoglobin: 14.4 g/dL (ref 12.0–15.0)
Immature Granulocytes: 0 %
Lymphocytes Relative: 16 %
Lymphs Abs: 1.3 10*3/uL (ref 0.7–4.0)
MCH: 31.5 pg (ref 26.0–34.0)
MCHC: 35.1 g/dL (ref 30.0–36.0)
MCV: 89.7 fL (ref 80.0–100.0)
Monocytes Absolute: 0.8 10*3/uL (ref 0.1–1.0)
Monocytes Relative: 10 %
Neutro Abs: 5.8 10*3/uL (ref 1.7–7.7)
Neutrophils Relative %: 74 %
Platelets: 240 10*3/uL (ref 150–400)
RBC: 4.57 MIL/uL (ref 3.87–5.11)
RDW: 12.4 % (ref 11.5–15.5)
WBC: 7.9 10*3/uL (ref 4.0–10.5)
nRBC: 0 % (ref 0.0–0.2)

## 2020-06-30 LAB — RESP PANEL BY RT-PCR (FLU A&B, COVID) ARPGX2
Influenza A by PCR: NEGATIVE
Influenza B by PCR: NEGATIVE
SARS Coronavirus 2 by RT PCR: NEGATIVE

## 2020-06-30 LAB — SEDIMENTATION RATE: Sed Rate: 22 mm/hr (ref 0–30)

## 2020-06-30 IMAGING — CT CT HEAD WO/W CM
4 of 5 series · 15 of 47 positions shown, 17 images · IV contrast (agent unspecified)
Comparison: None.

CLINICAL DATA: Cellulitis. Left face and your infection. Left TMJ
swelling. Swelling into the submandibular space.

EXAM:
CT HEAD WITHOUT AND WITH CONTRAST
CT MAXILLOFACIAL WITHOUT AND WITH CONTRAST
TECHNIQUE: Multidetector CT imaging of the head and maxillofacial structures
were performed using the standard protocol without intravenous
contrast. Multiplanar CT image reconstructions of the maxillofacial
structures were also generated.

[Series 2: head wo · axial · 0.41mm/px · z∈[+660,+765]mm · 5 of 33 slices shown, 7 images]
[im 6/33  brain]
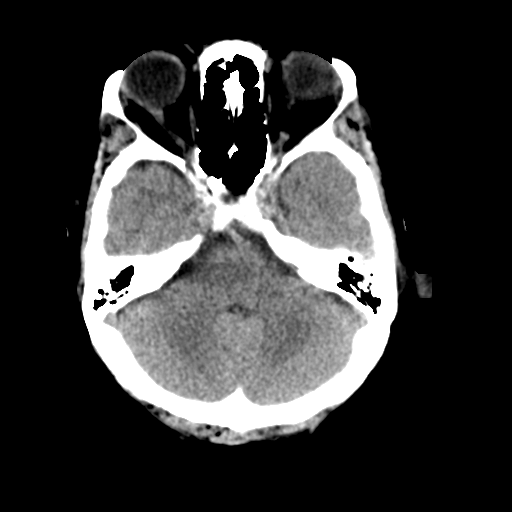
[im 6/33  bone]
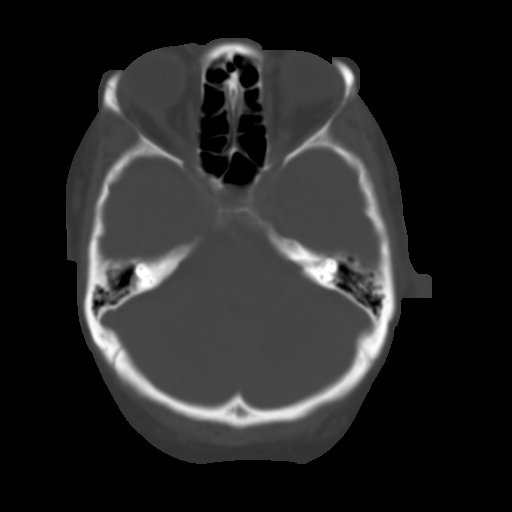
[im 11/33  brain]
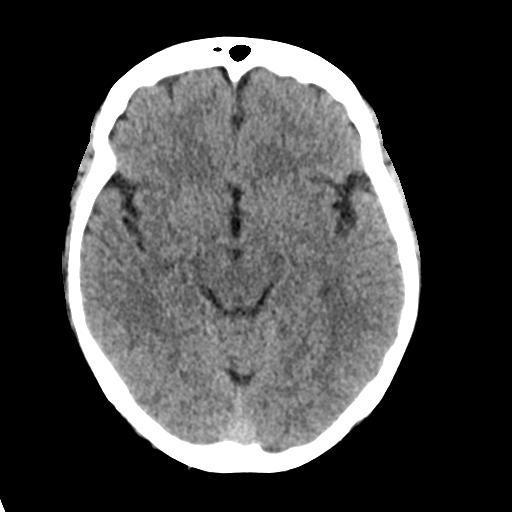
[im 17/33  brain]
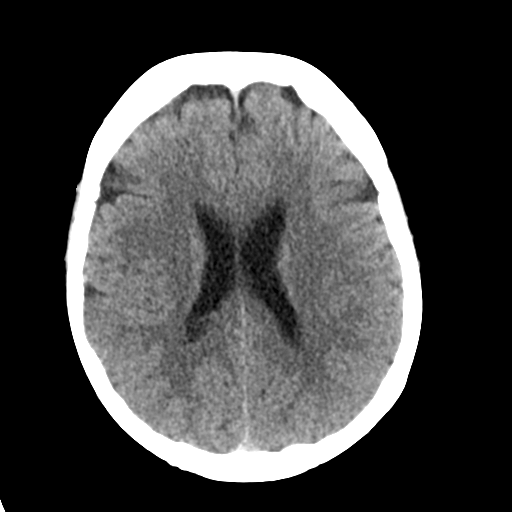
[im 22/33  brain]
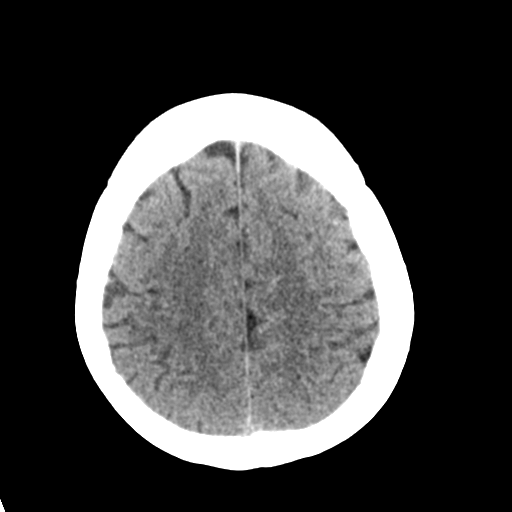
[im 27/33  brain]
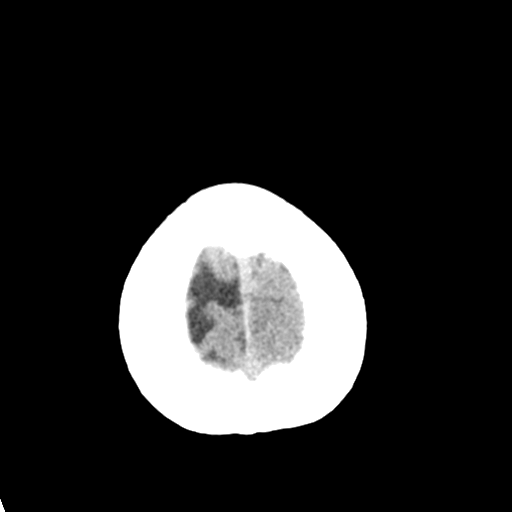
[im 27/33  bone]
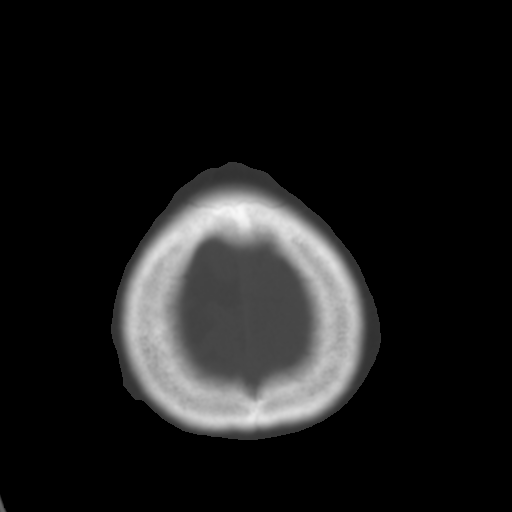

[Series 4: head (person_name) (person_name) · axial · 0.41mm/px · z∈[+666,+751]mm · 4 of 29 slices shown]
[im 6/29  brain]
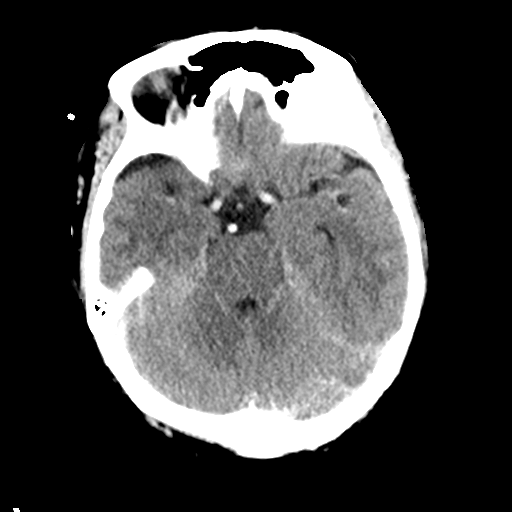
[im 12/29  brain]
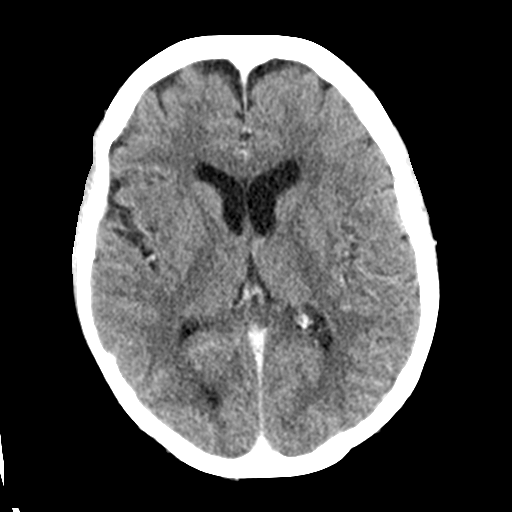
[im 17/29  brain]
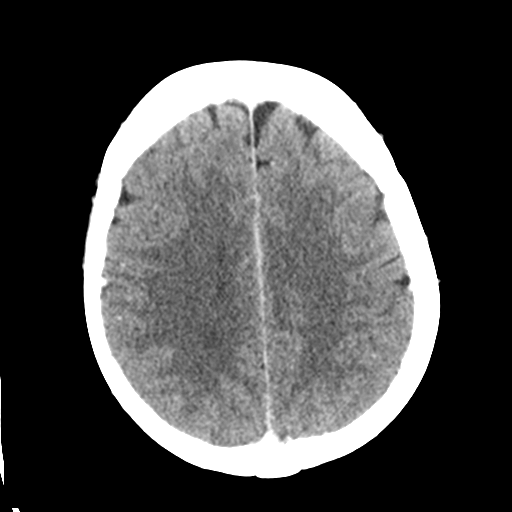
[im 23/29  brain]
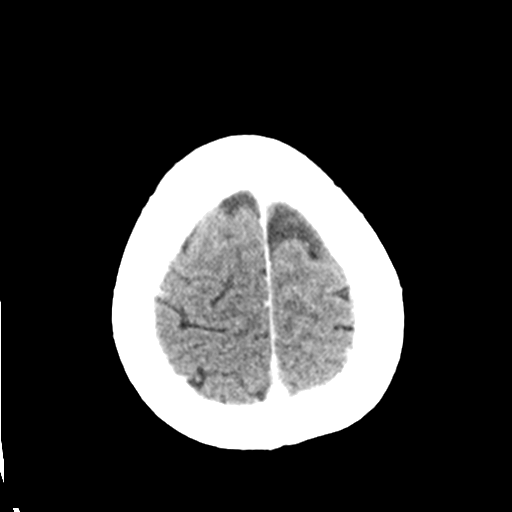

[Series 5: coronal soft tissue · coronal · 0.29mm/px · 3 of 68 slices shown]
[im 23/68  brain]
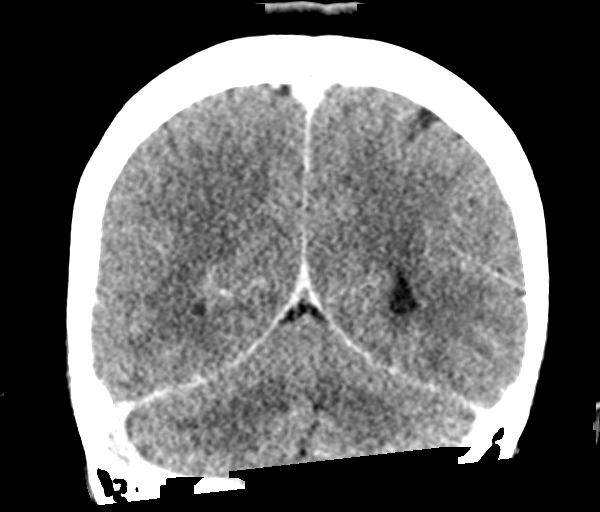
[im 30/68  brain]
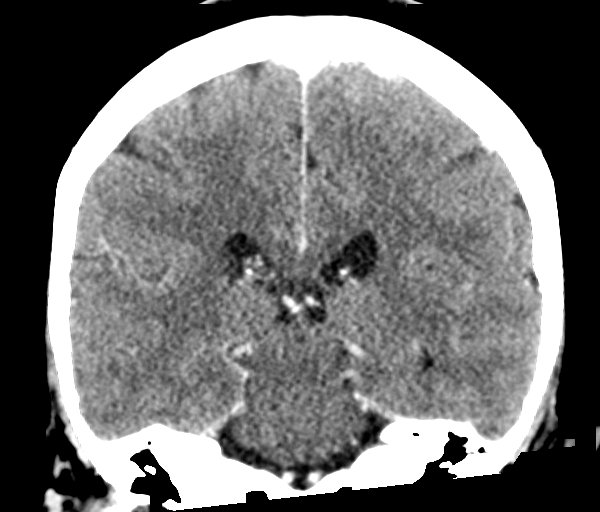
[im 38/68  brain]
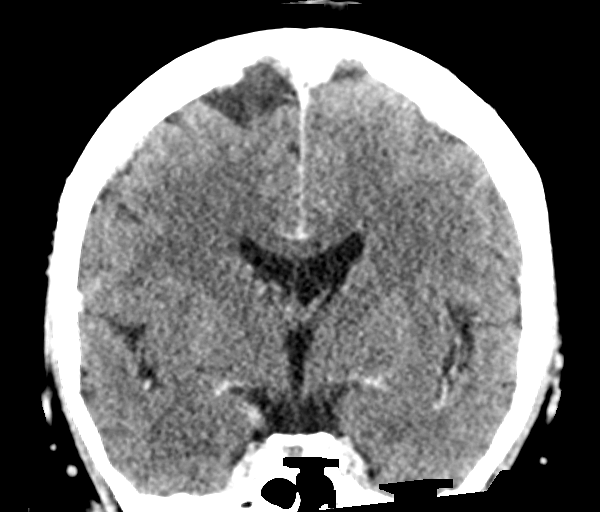

[Series 6: sagittal soft tissue · sagittal · 0.29mm/px · 3 of 58 slices shown]
[im 20/58  brain]
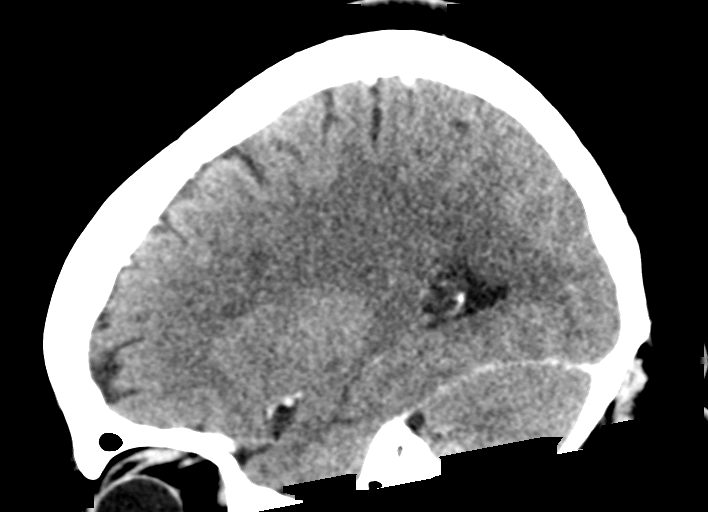
[im 29/58  brain]
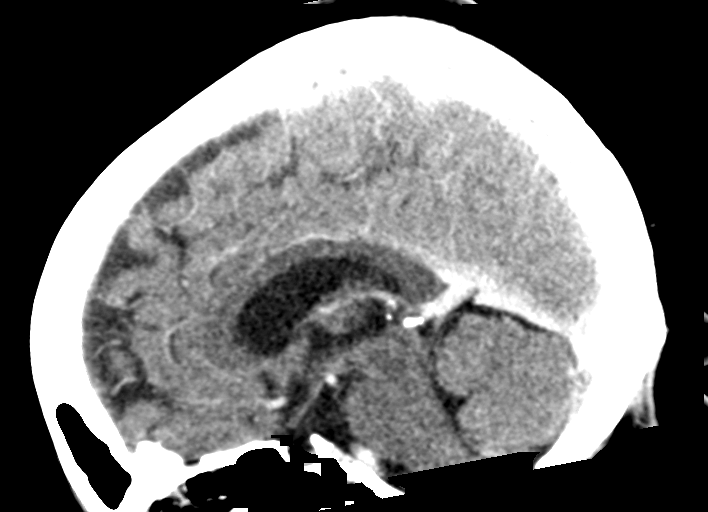
[im 39/58  brain]
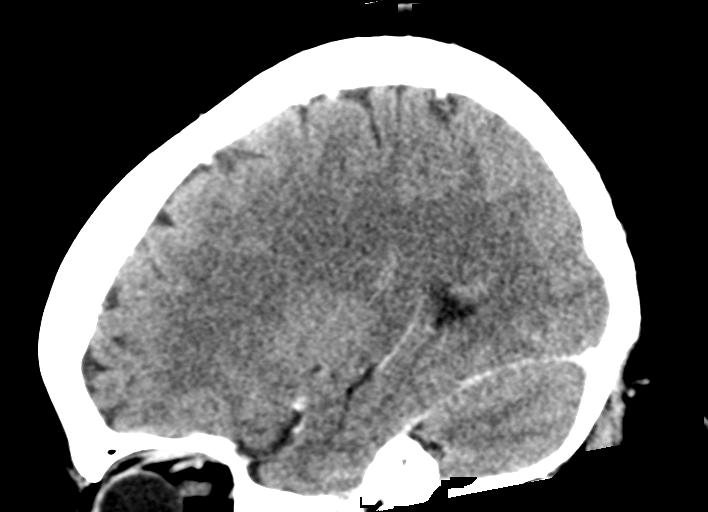

[15 of 47 positions shown; findings below may reference images not displayed]

FINDINGS: CT HEAD FINDINGS

Brain: No acute infarct, hemorrhage, or mass lesion is present. The
ventricles are of normal size. No significant white matter lesions
are present. No significant extraaxial fluid collection is present.
Postcontrast images demonstrate no pathologic enhancement. The
brainstem and cerebellum are within normal limits.

Vascular: No hyperdense vessel or unexpected calcification.

Skull: Calvarium is intact. No focal lytic or blastic lesions are
present. Soft tissue inflammatory changes surround the left ear
including the pinna. There is thickening of the skin in the external
auditory canal. No fluid collection is present.

CT MAXILLOFACIAL FINDINGS

Osseous: No fracture or mandibular dislocation. No destructive
process.

Orbits: Bilateral lens replacements are present. Globes and orbits
are otherwise within normal limits.

Sinuses: The paranasal sinuses and mastoid air cells are clear.

Soft tissues: Skin thickening is present about the left ear. No
fluid collection or mass lesion is present. Submandibular gland
scratched at there is slight thickening of the left platysma.
Submandibular space demonstrates minimal inflammation. Small nodes
are present. Subcutaneous fat demonstrates some inflammation over
the posterior left face over the parotid gland.

6 mm posterior left parotid lymph node noted.
IMPRESSION: 1. Soft tissue inflammatory changes about the left ear including the
pinna compatible with acute otitis externa.
2. No fluid collection or mass lesion.
3. Normal CT appearance of the brain.
4. No acute or focal lesion to explain the patient's symptoms.

## 2020-06-30 IMAGING — CT CT MAXILLOFACIAL W/ CM
3 series · 15 of 47 positions shown, 18 images · IV contrast (APPLIED)
Comparison: None.

CLINICAL DATA: Cellulitis. Left face and your infection. Left TMJ
swelling. Swelling into the submandibular space.

EXAM:
CT HEAD WITHOUT AND WITH CONTRAST
CT MAXILLOFACIAL WITHOUT AND WITH CONTRAST
TECHNIQUE: Multidetector CT imaging of the head and maxillofacial structures
were performed using the standard protocol without intravenous
contrast. Multiplanar CT image reconstructions of the maxillofacial
structures were also generated.

[Series 2: max soft (person_name) · axial · 0.31mm/px · z∈[+536,+684]mm · 9 of 86 slices shown, 12 images]
[im 6/86  brain]
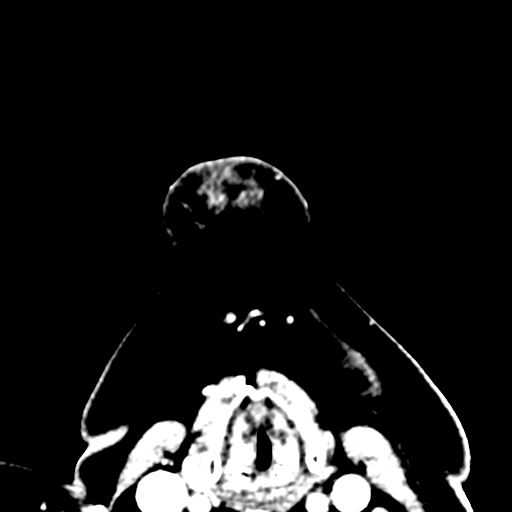
[im 6/86  bone]
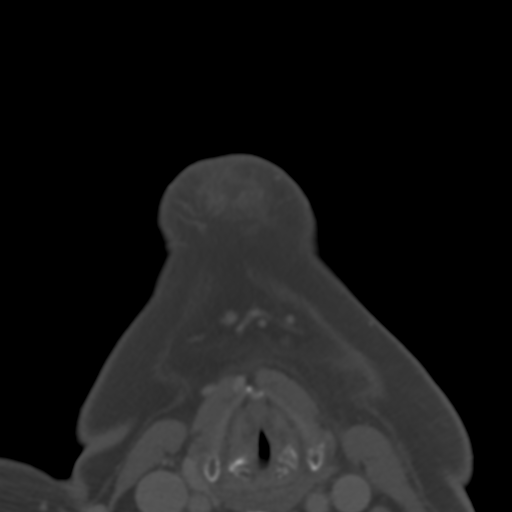
[im 15/86  bone]
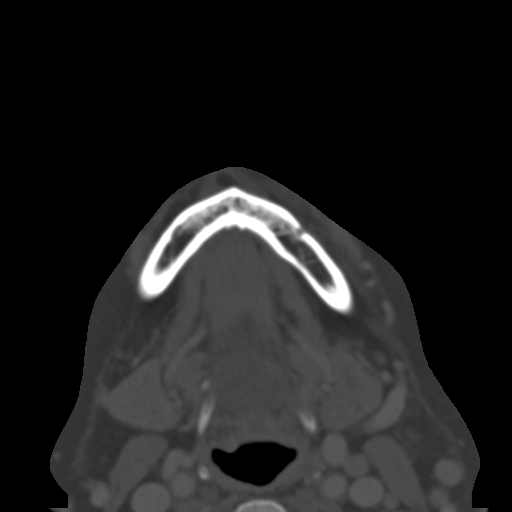
[im 24/86  bone]
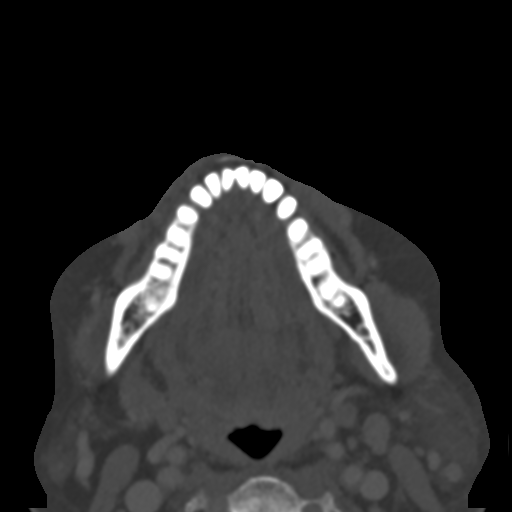
[im 33/86  bone]
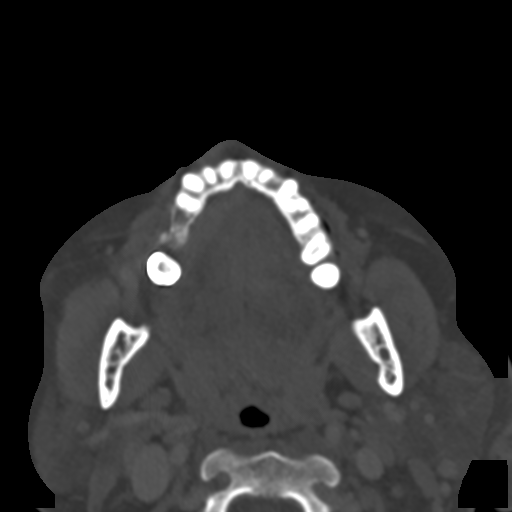
[im 44/86  brain]
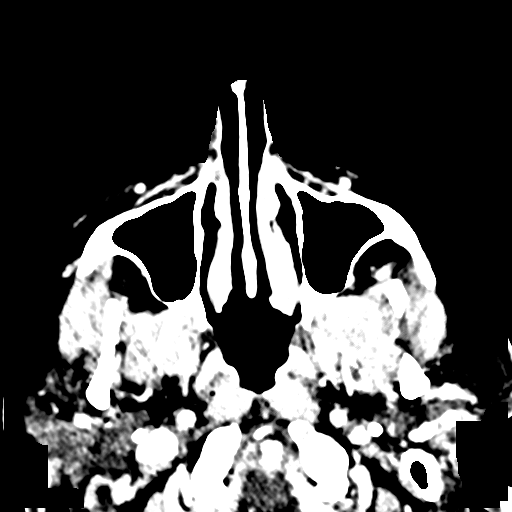
[im 44/86  bone]
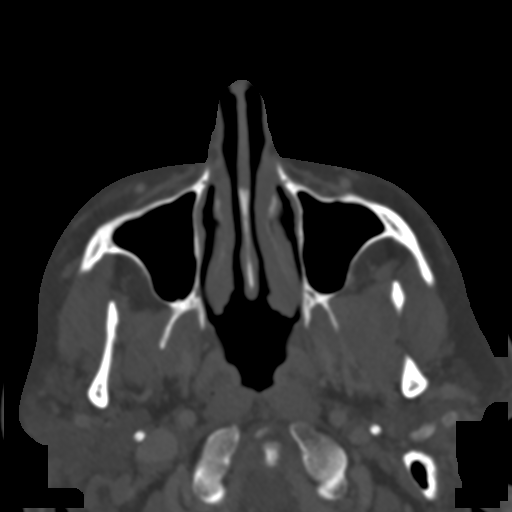
[im 53/86  bone]
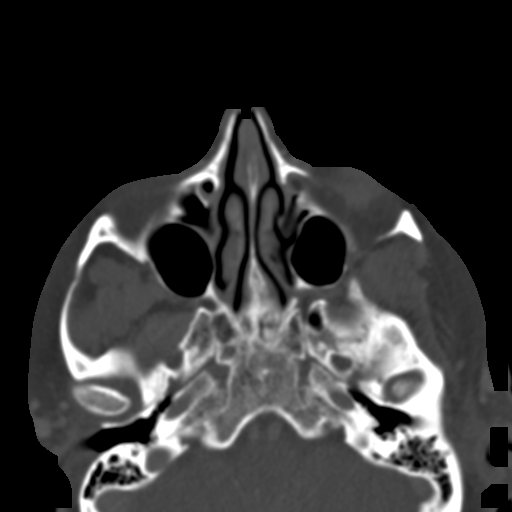
[im 62/86  bone]
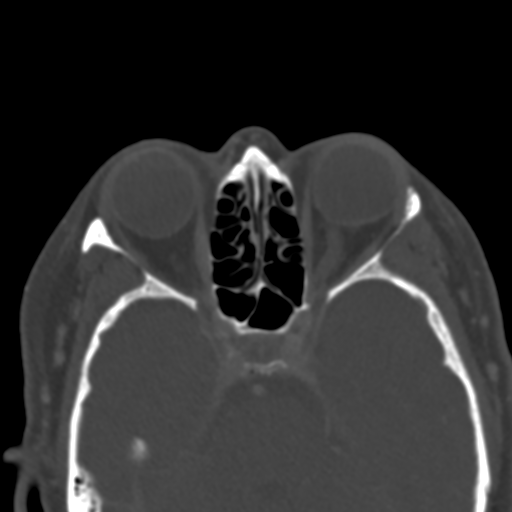
[im 71/86  bone]
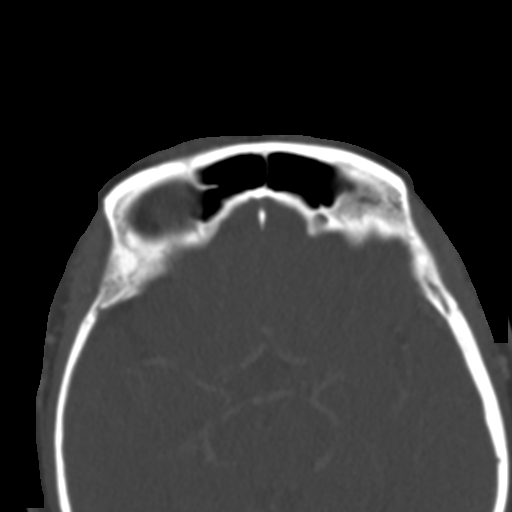
[im 80/86  brain]
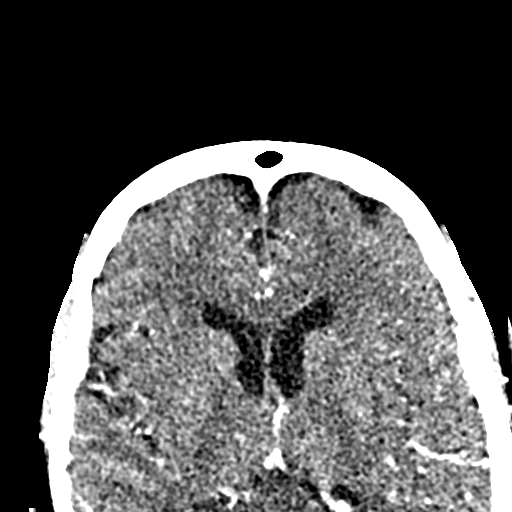
[im 80/86  bone]
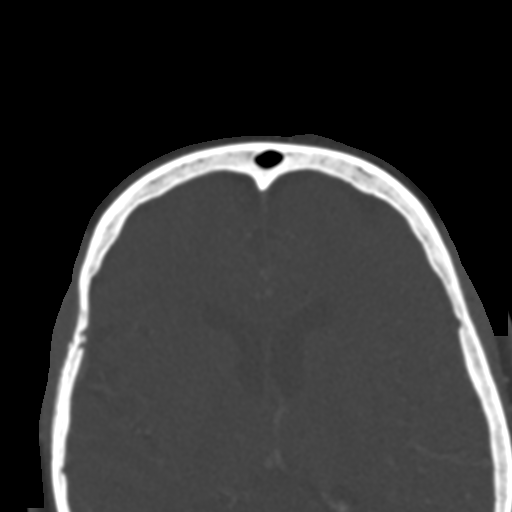

[Series 6: coronal soft · coronal · 0.34mm/px · 3 of 75 slices shown]
[im 25/75  bone]
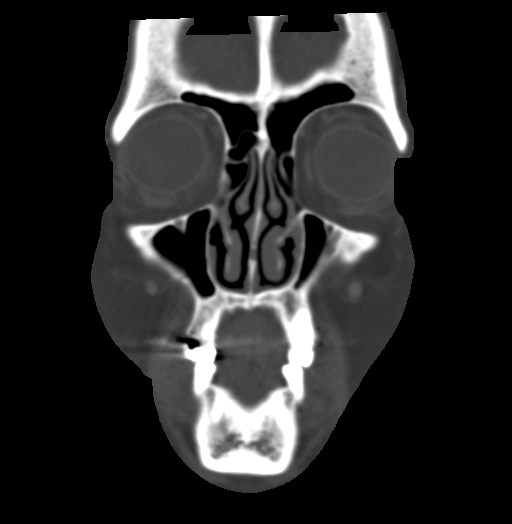
[im 33/75  bone]
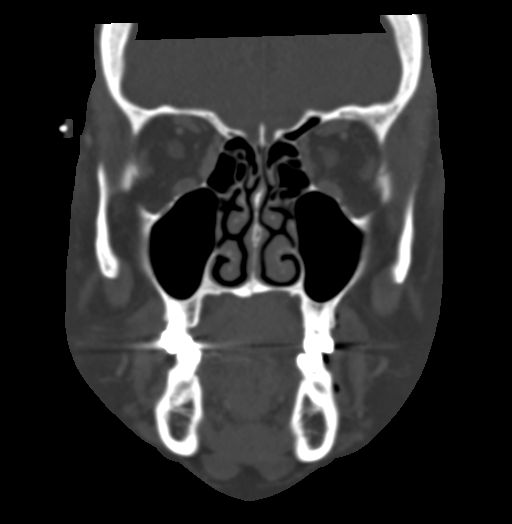
[im 42/75  bone]
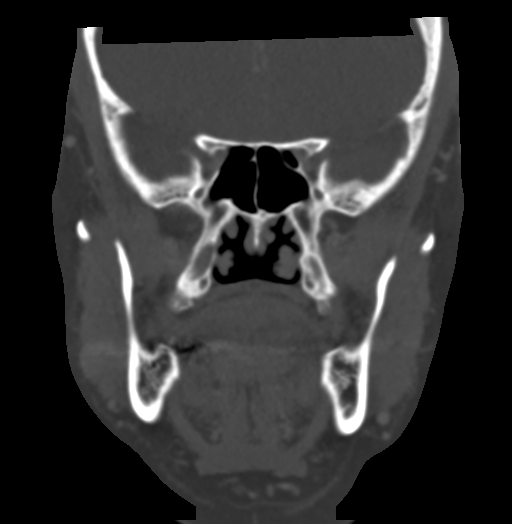

[Series 7: sagittal soft · sagittal · 0.29mm/px · 3 of 88 slices shown]
[im 30/88  bone]
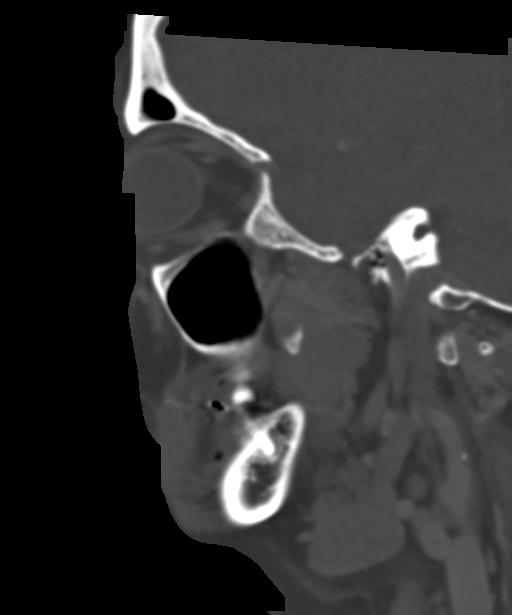
[im 44/88  bone]
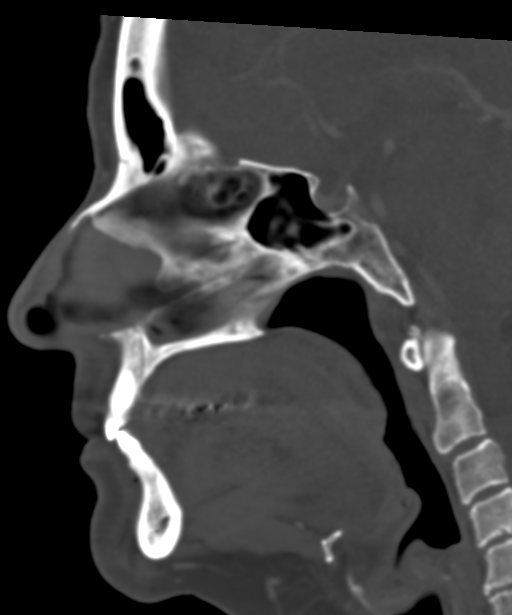
[im 59/88  bone]
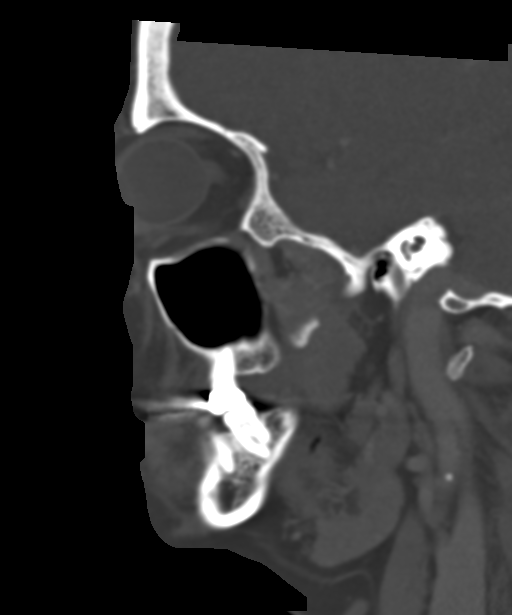

[15 of 47 positions shown; findings below may reference images not displayed]

FINDINGS: CT HEAD FINDINGS

Brain: No acute infarct, hemorrhage, or mass lesion is present. The
ventricles are of normal size. No significant white matter lesions
are present. No significant extraaxial fluid collection is present.
Postcontrast images demonstrate no pathologic enhancement. The
brainstem and cerebellum are within normal limits.

Vascular: No hyperdense vessel or unexpected calcification.

Skull: Calvarium is intact. No focal lytic or blastic lesions are
present. Soft tissue inflammatory changes surround the left ear
including the pinna. There is thickening of the skin in the external
auditory canal. No fluid collection is present.

CT MAXILLOFACIAL FINDINGS

Osseous: No fracture or mandibular dislocation. No destructive
process.

Orbits: Bilateral lens replacements are present. Globes and orbits
are otherwise within normal limits.

Sinuses: The paranasal sinuses and mastoid air cells are clear.

Soft tissues: Skin thickening is present about the left ear. No
fluid collection or mass lesion is present. Submandibular gland
scratched at there is slight thickening of the left platysma.
Submandibular space demonstrates minimal inflammation. Small nodes
are present. Subcutaneous fat demonstrates some inflammation over
the posterior left face over the parotid gland.

6 mm posterior left parotid lymph node noted.
IMPRESSION: 1. Soft tissue inflammatory changes about the left ear including the
pinna compatible with acute otitis externa.
2. No fluid collection or mass lesion.
3. Normal CT appearance of the brain.
4. No acute or focal lesion to explain the patient's symptoms.

## 2020-06-30 IMAGING — MR MR HEAD WO/W CM
13 series · 44 of 48 positions shown · IV contrast (gadavist)
Comparison: None.

CLINICAL DATA: Left facial pain.  CN 7 neuropathy.

EXAM:
MRI HEAD WITHOUT AND WITH CONTRAST
TECHNIQUE: Multiplanar, multiecho pulse sequences of the brain and surrounding
structures were obtained without and with intravenous contrast.
CONTRAST:  7.5mL GADAVIST GADOBUTROL 1 MMOL/ML IV SOLN

[Series 5: ax dwi_tracew · axial · 3.0mm · 0.65mm/px · z∈[-65,+88]mm · 4 of 48 slices shown]
[im 1/48]
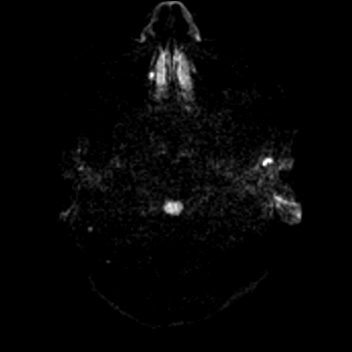
[im 16/48]
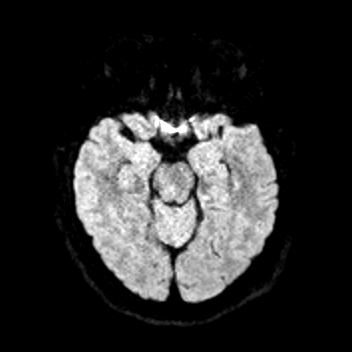
[im 32/48]
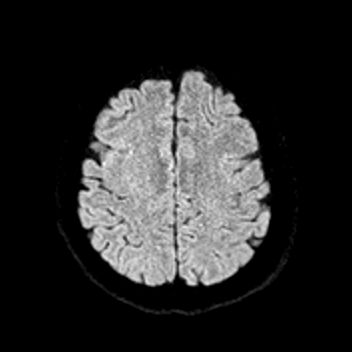
[im 48/48]
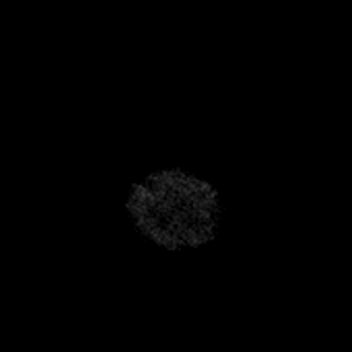

[Series 6: ax dwi_adc · axial · 3.0mm · 0.65mm/px · z∈[-65,+88]mm · 4 of 48 slices shown]
[im 1/48]
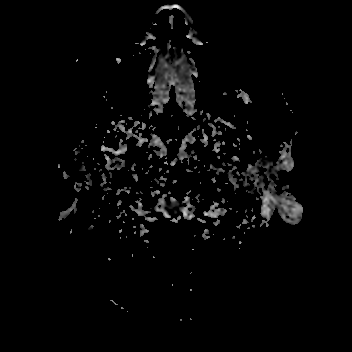
[im 16/48]
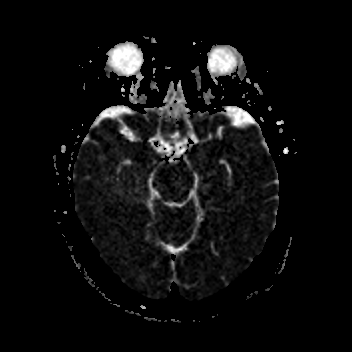
[im 32/48]
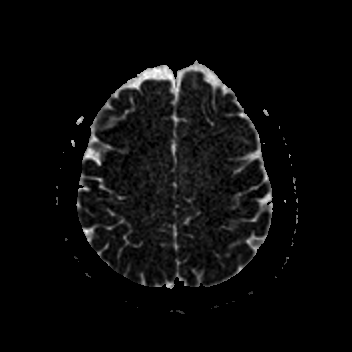
[im 48/48]
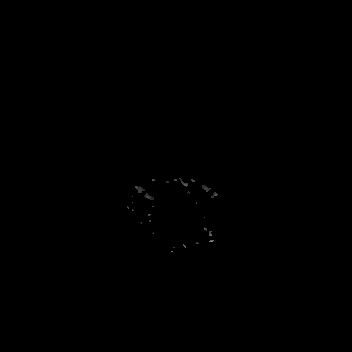

[Series 7: cor dwi_tracew · coronal · 5.0mm · 0.60mm/px · 2 of 38 slices shown]
[im 1/38]
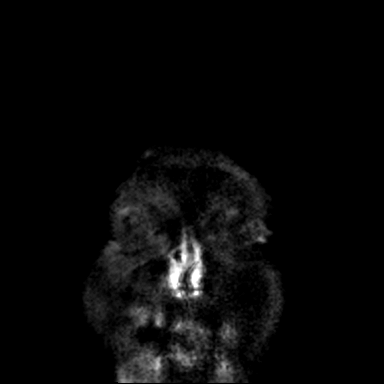
[im 38/38]
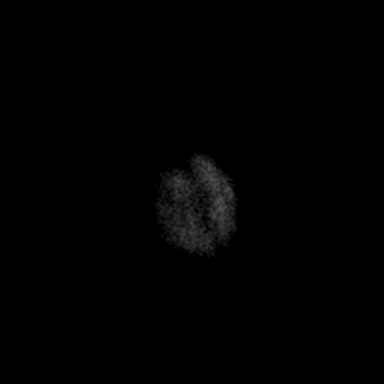

[Series 8: cor dwi_adc · coronal · 5.0mm · 0.60mm/px · 2 of 37 slices shown]
[im 1/37]
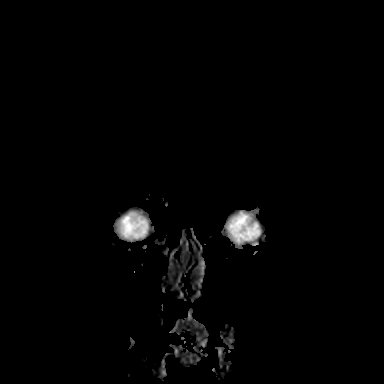
[im 37/37]
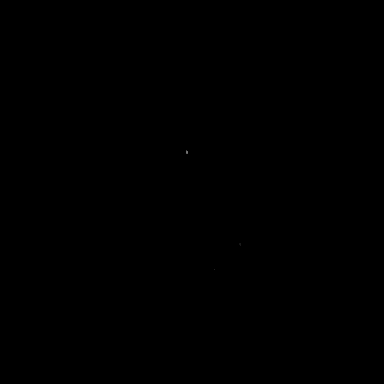

[Series 9: T1 · sagittal · 5.0mm · 0.62mm/px · 1 of 23 slices shown (1 of 2)]
[im 1/23]
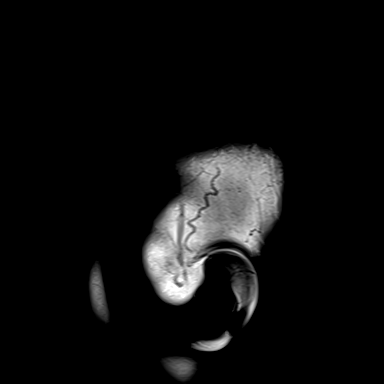

[Series 10: T2 · axial · 5.0mm · 0.45mm/px · z∈[-67,+87]mm · 2 of 27 slices shown (1 of 2)]
[im 1/27]
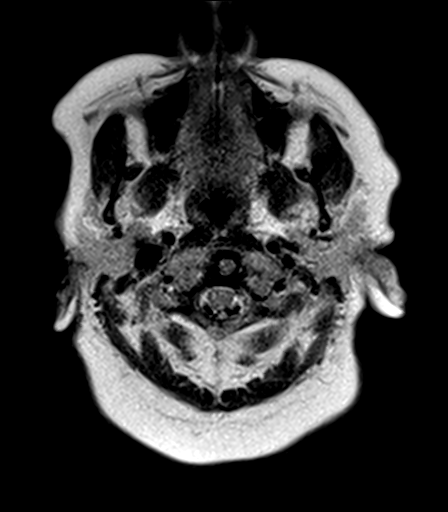
[im 27/27]
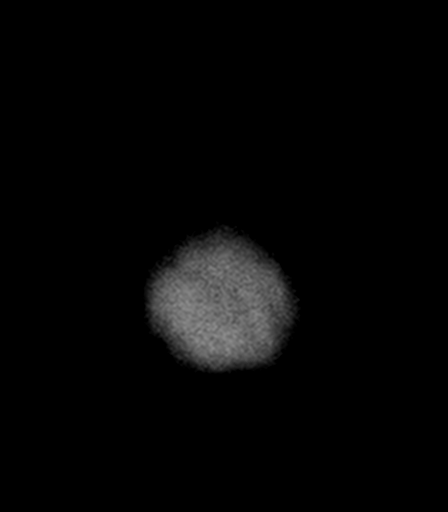

[Series 12: pha_images · axial · 3.0mm · 0.90mm/px · z∈[-62,+86]mm · 3 of 51 slices shown]
[im 1/51]
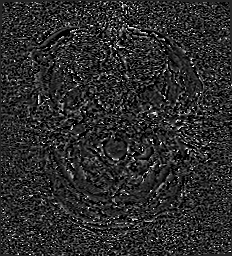
[im 26/51]
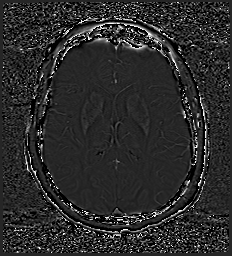
[im 51/51]
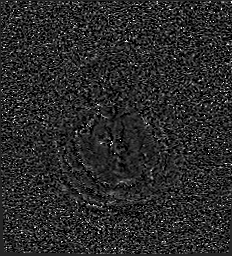

[Series 13: swi_images · axial · 3.0mm · 0.90mm/px · z∈[-65,+86]mm · 3 of 52 slices shown]
[im 1/52]
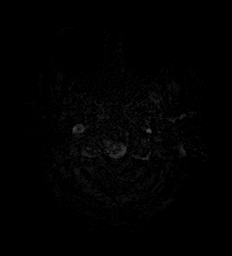
[im 26/52]
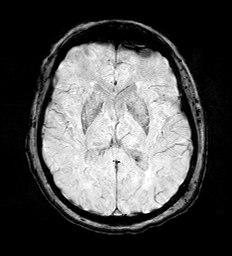
[im 52/52]
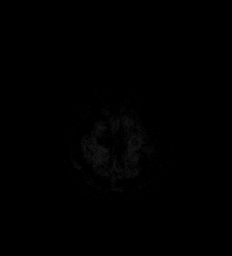

[Series 15: FLAIR · axial · 3.0mm · 0.53mm/px · z∈[-64,+81]mm · 3 of 50 slices shown]
[im 1/50]
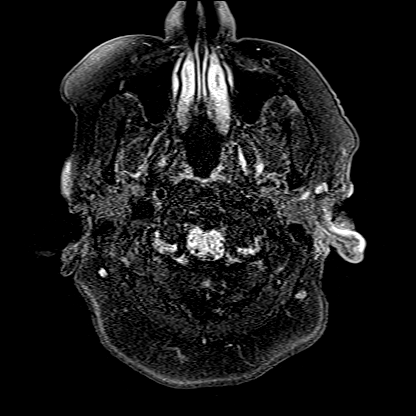
[im 25/50]
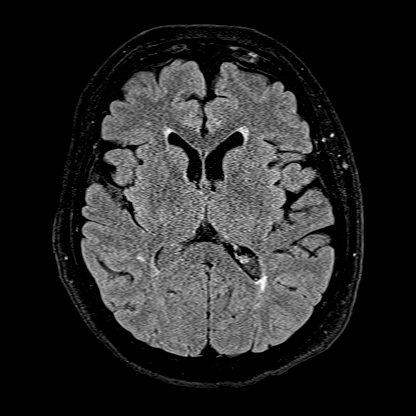
[im 50/50]
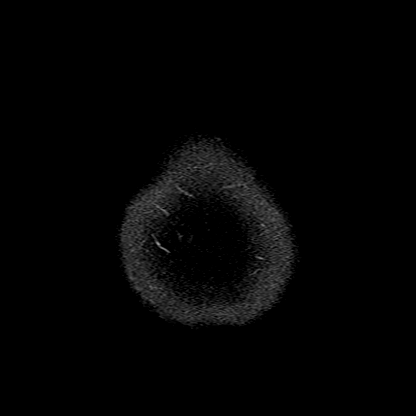

[Series 16: T1 · axial · 1.0mm · 0.98mm/px · z∈[-68,+89]mm · 8 of 160 slices shown (2 of 2)]
[im 1/160]
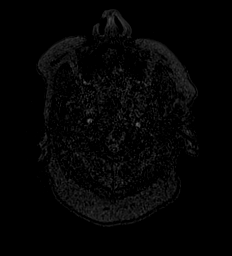
[im 18/160]
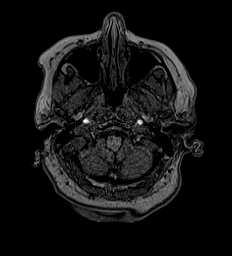
[im 54/160]
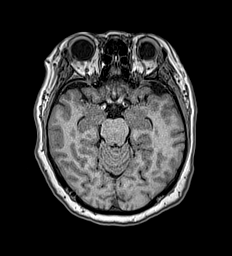
[im 71/160]
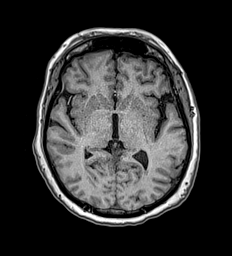
[im 89/160]
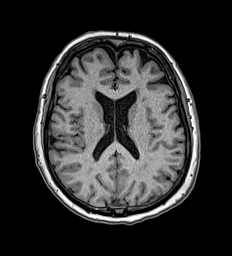
[im 107/160]
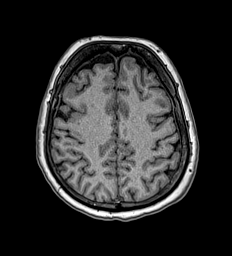
[im 142/160]
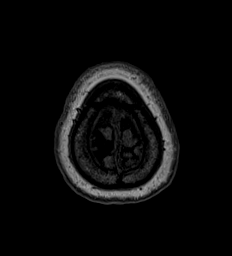
[im 160/160]
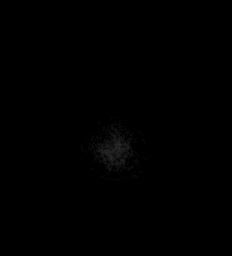

[Series 17: T2 · coronal · 5.0mm · 0.45mm/px · 2 of 31 slices shown (2 of 2)]
[im 1/31]
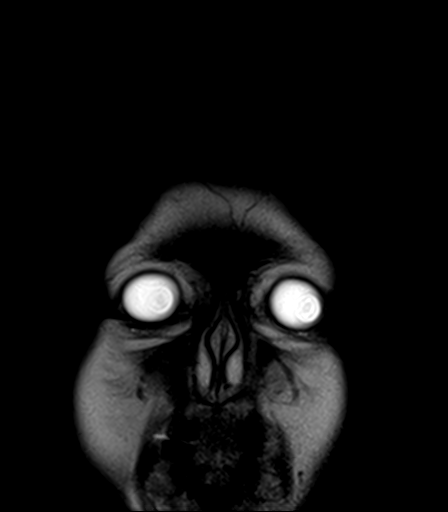
[im 31/31]
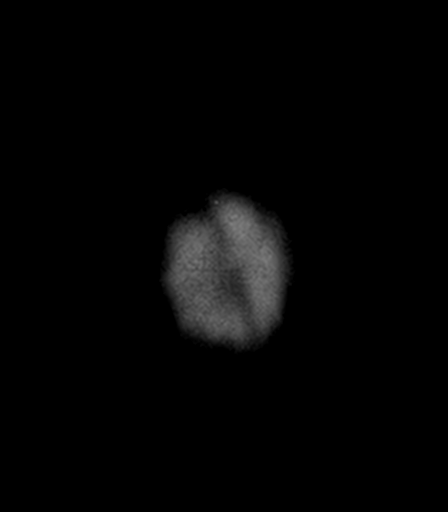

[Series 18: T1 post-contrast · axial · 1.0mm · 0.98mm/px · z∈[-68,+89]mm · 8 of 160 slices shown (1 of 2)]
[im 1/160]
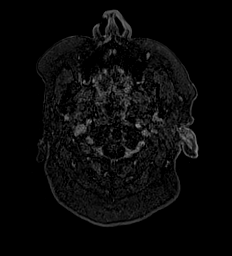
[im 18/160]
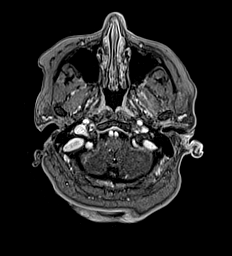
[im 54/160]
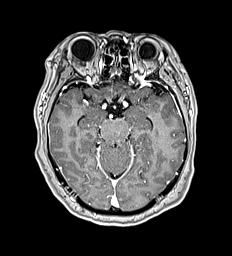
[im 71/160]
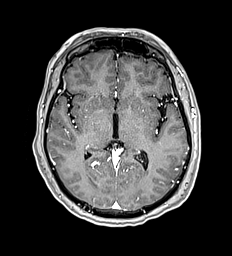
[im 89/160]
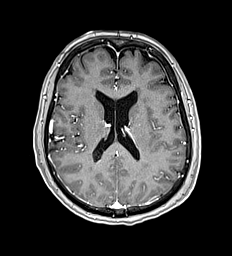
[im 107/160]
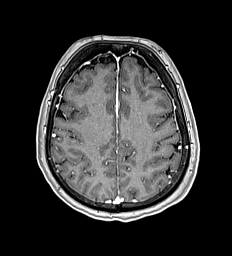
[im 142/160]
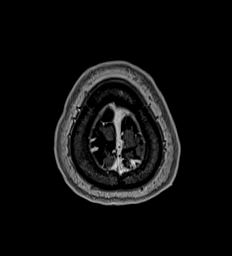
[im 160/160]
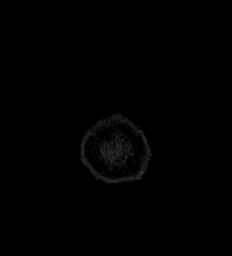

[Series 19: T1 post-contrast · coronal · 5.0mm · 0.57mm/px · 2 of 29 slices shown (2 of 2)]
[im 1/29]
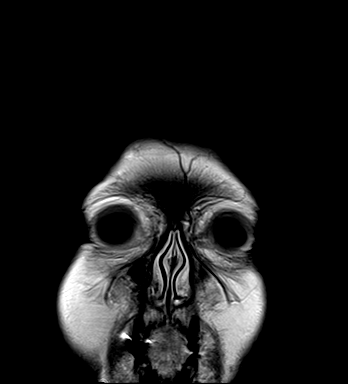
[im 29/29]
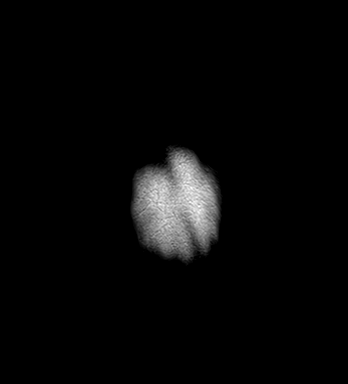

[44 of 48 positions shown; findings below may reference images not displayed]

FINDINGS: Brain: No acute infarct, mass effect or extra-axial collection. No
acute or chronic hemorrhage. There is multifocal hyperintense
T2-weighted signal within the white matter. Parenchymal volume and
CSF spaces are normal. The midline structures are normal. There is
mild contrast enhancement of the canalicular segment, geniculate
ganglion and mastoid segment of the left facial nerve.

Vascular: Major flow voids are preserved.

Skull and upper cervical spine: There is mild hyperintense
T2-weighted signal and contrast enhancement of the soft tissues of
the left ear and external auditory canal.

Sinuses/Orbits:No paranasal sinus fluid levels or advanced mucosal
thickening. No mastoid or middle ear effusion. Normal orbits.
IMPRESSION: 1. Contrast enhancement of the left ear and external auditory canal
soft tissues may indicate otitis externa.
2. Mild contrast enhancement of the canalicular segment, geniculate
ganglion and mastoid segment of the left facial nerve. This is
nonspecific and, while it may be seen in [name], the segments
may also enhance in normal patients.
3. Findings of mild chronic microvascular ischemia.

## 2020-06-30 MED ORDER — ACETAMINOPHEN 325 MG PO TABS: 650.0000 mg | ORAL_TABLET | Freq: Four times a day (QID) | ORAL | Status: DC | PRN

## 2020-06-30 MED ORDER — PREDNISONE 50 MG PO TABS
60.0000 mg | ORAL_TABLET | Freq: Every day | ORAL | Status: DC
  Administered 2020-07-01: 60 mg via ORAL
  Filled 2020-06-30: qty 1

## 2020-06-30 MED ORDER — SENNOSIDES-DOCUSATE SODIUM 8.6-50 MG PO TABS
1.0000 | ORAL_TABLET | Freq: Every evening | ORAL | Status: DC | PRN
  Administered 2020-07-03 (×2): 1 via ORAL
  Filled 2020-06-30 (×2): qty 1

## 2020-06-30 MED ORDER — ENOXAPARIN SODIUM 40 MG/0.4ML IJ SOSY
0.5000 mg/kg | PREFILLED_SYRINGE | INTRAMUSCULAR | Status: DC
  Administered 2020-06-30 – 2020-07-03 (×4): 42.5 mg via SUBCUTANEOUS
  Filled 2020-06-30 (×4): qty 0.8

## 2020-06-30 MED ORDER — DIPHENHYDRAMINE HCL 25 MG PO CAPS: 25.0000 mg | ORAL_CAPSULE | Freq: Four times a day (QID) | ORAL | Status: DC | PRN

## 2020-06-30 MED ORDER — PANTOPRAZOLE SODIUM 40 MG PO TBEC: 40.0000 mg | DELAYED_RELEASE_TABLET | Freq: Every day | ORAL | Status: DC | PRN

## 2020-06-30 MED ORDER — ADULT MULTIVITAMIN W/MINERALS CH
1.0000 | ORAL_TABLET | Freq: Every day | ORAL | Status: DC
  Administered 2020-07-01 – 2020-07-04 (×4): 1 via ORAL
  Filled 2020-06-30 (×4): qty 1

## 2020-06-30 MED ORDER — GADOBUTROL 1 MMOL/ML IV SOLN
7.5000 mL | Freq: Once | INTRAVENOUS | Status: AC | PRN
  Administered 2020-07-01: 7.5 mL via INTRAVENOUS

## 2020-06-30 MED ORDER — VANCOMYCIN HCL IN DEXTROSE 1-5 GM/200ML-% IV SOLN
1000.0000 mg | INTRAVENOUS | Status: DC
  Administered 2020-07-01: 1000 mg via INTRAVENOUS
  Filled 2020-06-30: qty 200

## 2020-06-30 MED ORDER — SODIUM CHLORIDE 0.9 % IV SOLN
2.0000 g | Freq: Once | INTRAVENOUS | Status: AC
  Administered 2020-06-30: 2 g via INTRAVENOUS
  Filled 2020-06-30: qty 20

## 2020-06-30 MED ORDER — VANCOMYCIN HCL 1500 MG/300ML IV SOLN
1500.0000 mg | Freq: Once | INTRAVENOUS | Status: AC
  Administered 2020-06-30: 1500 mg via INTRAVENOUS
  Filled 2020-06-30: qty 300

## 2020-06-30 MED ORDER — VANCOMYCIN HCL IN DEXTROSE 1-5 GM/200ML-% IV SOLN: 1000.0000 mg | Freq: Once | INTRAVENOUS | Status: DC

## 2020-06-30 MED ORDER — DIPHENHYDRAMINE HCL 50 MG/ML IJ SOLN
25.0000 mg | Freq: Once | INTRAMUSCULAR | Status: AC
  Administered 2020-06-30: 18:00:00 25 mg via INTRAVENOUS
  Filled 2020-06-30: qty 1

## 2020-06-30 MED ORDER — MORPHINE SULFATE (PF) 4 MG/ML IV SOLN
4.0000 mg | Freq: Once | INTRAVENOUS | Status: AC
  Administered 2020-06-30: 4 mg via INTRAVENOUS
  Filled 2020-06-30: qty 1

## 2020-06-30 MED ORDER — HYDROMORPHONE HCL 1 MG/ML IJ SOLN
1.0000 mg | Freq: Once | INTRAMUSCULAR | Status: AC
  Administered 2020-06-30: 1 mg via INTRAVENOUS
  Filled 2020-06-30: qty 1

## 2020-06-30 MED ORDER — OXYCODONE-ACETAMINOPHEN 5-325 MG PO TABS
1.0000 | ORAL_TABLET | ORAL | Status: DC | PRN
  Administered 2020-07-01 – 2020-07-03 (×9): 1 via ORAL
  Filled 2020-06-30 (×9): qty 1

## 2020-06-30 MED ORDER — ATORVASTATIN CALCIUM 20 MG PO TABS
20.0000 mg | ORAL_TABLET | Freq: Every day | ORAL | Status: DC
  Administered 2020-06-30 – 2020-07-03 (×4): 20 mg via ORAL
  Filled 2020-06-30 (×4): qty 1

## 2020-06-30 MED ORDER — MULTIVITAMIN ADULT PO CHEW: 1.0000 | CHEWABLE_TABLET | Freq: Every day | ORAL | Status: DC

## 2020-06-30 MED ORDER — IOHEXOL 300 MG/ML  SOLN
75.0000 mL | Freq: Once | INTRAMUSCULAR | Status: AC | PRN
  Administered 2020-06-30: 75 mL via INTRAVENOUS

## 2020-06-30 MED ORDER — ONDANSETRON HCL 4 MG/2ML IJ SOLN: 4.0000 mg | Freq: Three times a day (TID) | INTRAMUSCULAR | Status: DC | PRN

## 2020-06-30 MED ORDER — METHYLPREDNISOLONE SODIUM SUCC 40 MG IJ SOLR
40.0000 mg | Freq: Once | INTRAMUSCULAR | Status: AC
  Administered 2020-06-30: 40 mg via INTRAVENOUS
  Filled 2020-06-30: qty 1

## 2020-06-30 MED ORDER — SODIUM CHLORIDE 0.9 % IV SOLN
2.0000 g | Freq: Three times a day (TID) | INTRAVENOUS | Status: DC
  Administered 2020-06-30 – 2020-07-01 (×2): 2 g via INTRAVENOUS
  Filled 2020-06-30 (×4): qty 2

## 2020-06-30 MED ORDER — ONDANSETRON HCL 4 MG/2ML IJ SOLN
4.0000 mg | Freq: Once | INTRAMUSCULAR | Status: AC
  Administered 2020-06-30: 4 mg via INTRAVENOUS
  Filled 2020-06-30: qty 2

## 2020-06-30 MED ORDER — MORPHINE SULFATE (PF) 2 MG/ML IV SOLN
1.0000 mg | INTRAVENOUS | Status: DC | PRN
Start: 2020-06-30 — End: 2020-07-01
  Administered 2020-06-30: 22:00:00 1 mg via INTRAVENOUS
  Filled 2020-06-30: qty 1

## 2020-06-30 NOTE — ED Notes (Signed)
Pt to ED POV for left ear pain, swelling, left facial droop since Monday. Clear speech, alert and oriented. +dizziness

## 2020-06-30 NOTE — Telephone Encounter (Addendum)
Patient reports she was seen in the office yesterday- she states she is having increased symptoms- can not close L eye, L ear in worse, face is swelling, nose is red. Patient state sshe is worse than yesterday when she was seen- patient has been advised by provider if her symptoms get worse- go to ED. Patient advised if she has developed increased symptoms- go to ED- wants to rule out reaction to new medication given to treat infection.  Reason for Disposition . [1] SEVERE pain AND [2] not improved 2 hours after pain medicine  Answer Assessment - Initial Assessment Questions 1. ANTIBIOTIC: "What antibiotic are you receiving?" "How many times per day?" "Ear drops or pills"     Ciprodex 2. ONSET: "When was the antibiotic started?"     yesterday 3. PAIN: "How bad is the pain?"  (Scale 1-10; mild, moderate or severe)   - MILD (1-3): doesn't interfere with normal activities    - MODERATE (4-7): interferes with normal activities or awakens from sleep    - SEVERE (8-10): excruciating pain, unable to do any normal activities      Pain and swelling worse 4. DISCHARGE: "Is there any discharge? What color is it?"      no 5. FEVER: "Do you have a fever?" If Yes, ask: "What is your temperature, how was it measured, and when did it start?"     Not asked 6. OTHER SYMPTOMS: "Do you have any other symptoms?" (e.g., redness of ear, headache, stiff neck, sore throat)     Face swelling, L eye swelling 7. PREGNANCY: "Is there any chance you are pregnant?" "When was your last menstrual period?"     n/a  Protocols used: EAR - OTITIS EXTERNA FOLLOW-UP CALL-A-AH

## 2020-06-30 NOTE — H&P (Addendum)
History and Physical    Abigail Delacruz MGQ:[id number] DOB: 1950/06/26 DOA: 06/30/2020  Referring MD/NP/PA:   PCP: Jearld Fenton, NP   Patient coming from:  The patient is coming from home.  At baseline, pt is independent for most of ADL.        Chief Complaint: left ear pain and left facial swelling  HPI: Abigail Delacruz is a 70 y.o. female with medical history significant of hyperlipidemia, GERD, cataracts, who presents with left ear pain, left facial swelling.  Patient states that she was recently in [place], returned home about a week ago, subsequently developed pain left ear. Her left ear has been progressively swelling.  Patient has a little clear drainage from left ear.  No fever or chills.  She also developed progressively worsening left facial pain and swelling. This is associated with weakenss of the left side of the face.  No vision change.  No unilateral numbness or tingling in extremities.   Patient was seen by PCP twice and treated with Flonase and Ciprodex, IM Rocephin and Decadron, but symptoms has been worsening progressively.  She states her pain is severe, 9 out of 10 severity, constant, nonradiating.  Denies chest pain, cough, shortness of breath.  No nausea, vomiting, diarrhea or abdominal pain.  No symptoms of UTI.  ED Course: pt was found to have WBC 7.9, negative COVID PCR, electrolytes renal function okay, temperature normal, blood pressure 152/69, heart rate 67, RR 20, oxygen saturation 95% on room air.  CT of the head and CT of her maxillofacial image showed acute otitis externa.  Patient is admitted to [facility] bed as inpatient.  CT-head and maxillofacial image: 1. Soft tissue inflammatory changes about the left ear including the pinna compatible with acute otitis externa. 2. No fluid collection or mass lesion. 3. Normal CT appearance of the brain. 4. No acute or focal lesion to explain the patient's symptoms.   Review of Systems:   General: no fevers, chills, no  body weight gain, has fatigue HEENT: no blurry vision, hearing changes or sore throat.  Has pain, swelling in left ear and left face. Respiratory: no dyspnea, coughing, wheezing CV: no chest pain, no palpitations GI: no nausea, vomiting, abdominal pain, diarrhea, constipation GU: no dysuria, burning on urination, increased urinary frequency, hematuria  Ext: no leg edema Neuro: no unilateral weakness, numbness, or tingling, no vision change or hearing loss Skin: no rash, no skin tear. MSK: No muscle spasm, no deformity, no limitation of range of movement in spin Heme: No easy bruising.  Travel history: No recent long distant travel.  Allergy:  Allergies  Allergen Reactions  . Ciprodex [Ciprofloxacin-Dexamethasone]     Pt states that this ear drop she feels caused her swelling.   Asencion Islam [Fluticasone Propionate]     Past Medical History:  Diagnosis Date  . Cataract   . Hyperlipidemia     Past Surgical History:  Procedure Laterality Date  . APPENDECTOMY  1971  . CATARACT EXTRACTION W/PHACO Right 09/08/2017   Procedure: CATARACT EXTRACTION PHACO AND INTRAOCULAR LENS PLACEMENT ([facility])  RIGHT;  Surgeon: Eulogio Bear, MD;  Location: [facility];  Service: Ophthalmology;  Laterality: Right;  . CATARACT EXTRACTION W/PHACO Left 09/29/2017   Procedure: CATARACT EXTRACTION PHACO AND INTRAOCULAR LENS PLACEMENT ([facility]) LEFT;  Surgeon: Eulogio Bear, MD;  Location: [facility];  Service: Ophthalmology;  Laterality: Left;  . COLONOSCOPY WITH PROPOFOL N/A 10/20/2017   Procedure: COLONOSCOPY WITH PROPOFOL;  Surgeon: Lucilla Lame,  MD;  Location: [facility];  Service: Endoscopy;  Laterality: N/A;    Social History:  reports that she quit smoking about 48 years ago. She has never used smokeless tobacco. She reports current alcohol use. She reports that she does not use drugs.  Family History:  Family History  Problem Relation Age of Onset  . Kidney failure Mother    . Cirrhosis Father   . Alcohol abuse Father   . Diabetes Brother   . Hypertension Brother   . Arthritis Brother   . Breast cancer Neg Hx      Prior to Admission medications   Medication Sig Start Date End Date Taking? Authorizing Provider  atorvastatin (LIPITOR) 20 MG tablet Take 1 tablet (20 mg total) by mouth daily. 05/26/20   Karamalegos, Devonne Doughty, DO  ciprofloxacin-dexamethasone (CIPRODEX) OTIC suspension Place 4 drops into the left ear 2 (two) times daily for 7 days. 06/29/20 07/06/20  Jearld Fenton, NP  fluticasone (FLONASE) 50 MCG/ACT nasal spray Place 2 sprays into both nostrils daily.    [provider]  ibuprofen (ADVIL) 800 MG tablet Take 800 mg by mouth every 8 (eight) hours as needed.    [provider]  Multiple Vitamins-Minerals (MULTIVITAMIN ADULT) CHEW Chew by mouth.    [provider]  omeprazole (PRILOSEC) 20 MG capsule Take 1 capsule (20 mg total) by mouth daily for 14 days. 06/26/20 07/10/20  Jearld Fenton, NP  traMADol (ULTRAM) 50 MG tablet Take 1 tablet (50 mg total) by mouth every 8 (eight) hours as needed for up to 5 days. 06/29/20 07/04/20  Jearld Fenton, NP    Physical Exam: Vitals:   06/30/20 1230 06/30/20 1300 06/30/20 1330 06/30/20 1400  BP: (!) 146/72 130/61 (!) 146/74 (!) 152/69  Pulse: (!) 57 (!) 57 60 67  Resp: 16   18  Temp:      TempSrc:      SpO2: 97% 95% 96% 99%  Weight:      Height:       General: Not in acute distress HEENT: pt has significant tenderness and swelling in left face, left external ear canal and auricle.  Has purulent blisters erupting in the left ear auricle.       Eyes: PERRL, EOMI, no scleral icterus.       ENT:  no pharynx injection, no tonsillar enlargement.        Neck: No JVD, no bruit, no mass felt. Heme: No neck lymph node enlargement. Cardiac: S1/S2, RRR, No murmurs, No gallops or rubs. Respiratory: No rales, wheezing, rhonchi or rubs. GI: Soft, nondistended, nontender, no rebound pain, no  organomegaly, BS present. GU: No hematuria Ext: No pitting leg edema bilaterally. 1+DP/PT pulse bilaterally. Musculoskeletal: No joint deformities, No joint redness or warmth, no limitation of ROM in spin. Skin: No rashes.  Neuro: Alert, oriented X3, cranial nerves II-XII grossly intact, moves all extremities normally Psych: Patient is not psychotic, no suicidal or hemocidal ideation.  Labs on Admission: I have personally reviewed following labs and imaging studies  CBC: Recent Labs  Lab 06/30/20 1054  WBC 7.9  NEUTROABS 5.8  HGB 14.4  HCT 41.0  MCV 89.7  PLT 540   Basic Metabolic Panel: Recent Labs  Lab 06/30/20 1054  NA 137  K 3.8  CL 102  CO2 27  GLUCOSE 105*  BUN 19  CREATININE 0.64  CALCIUM 9.2   GFR: Estimated Creatinine Clearance: 65.6 mL/min (by C-G formula based on SCr of  0.64 mg/dL). Liver Function Tests: No results for input(s): AST, ALT, ALKPHOS, BILITOT, PROT, ALBUMIN in the last 168 hours. No results for input(s): LIPASE, AMYLASE in the last 168 hours. No results for input(s): AMMONIA in the last 168 hours. Coagulation Profile: No results for input(s): INR, PROTIME in the last 168 hours. Cardiac Enzymes: No results for input(s): CKTOTAL, CKMB, CKMBINDEX, TROPONINI in the last 168 hours. BNP (last 3 results) No results for input(s): PROBNP in the last 8760 hours. HbA1C: No results for input(s): HGBA1C in the last 72 hours. CBG: No results for input(s): GLUCAP in the last 168 hours. Lipid Profile: No results for input(s): CHOL, HDL, LDLCALC, TRIG, CHOLHDL, LDLDIRECT in the last 72 hours. Thyroid Function Tests: No results for input(s): TSH, T4TOTAL, FREET4, T3FREE, THYROIDAB in the last 72 hours. Anemia Panel: No results for input(s): VITAMINB12, FOLATE, FERRITIN, TIBC, IRON, RETICCTPCT in the last 72 hours. Urine analysis:    Component Value Date/Time   BILIRUBINUR Negative 05/27/2019 0840   PROTEINUR Negative 05/27/2019 0840   UROBILINOGEN  0.2 05/27/2019 0840   NITRITE Negative 05/27/2019 0840   LEUKOCYTESUR Small (1+) (A) 05/27/2019 0840   Sepsis Labs: $[email or web address]'[id number]$ (procalcitonin:4,lacticidven:4) ) Recent Results (from the past 240 hour(s))  Resp Panel by RT-PCR (Flu A&B, Covid) Nasopharyngeal Swab     Status: None   Collection Time: 06/30/20 12:38 PM   Specimen: Nasopharyngeal Swab; Nasopharyngeal(NP) swabs in vial transport medium  Result Value Ref Range Status   SARS Coronavirus 2 by RT PCR NEGATIVE NEGATIVE Final    Comment: (NOTE) SARS-CoV-2 target nucleic acids are NOT DETECTED.  The SARS-CoV-2 RNA is generally detectable in upper respiratory specimens during the acute phase of infection. The lowest concentration of SARS-CoV-2 viral copies this assay can detect is 138 copies/mL. A negative result does not preclude SARS-Cov-2 infection and should not be used as the sole basis for treatment or other patient management decisions. A negative result may occur with  improper specimen collection/handling, submission of specimen other than nasopharyngeal swab, presence of viral mutation(s) within the areas targeted by this assay, and inadequate number of viral copies(<138 copies/mL). A negative result must be combined with clinical observations, patient history, and epidemiological information. The expected result is Negative.  Fact Sheet for Patients:  EntrepreneurPulse.com.au  Fact Sheet for Healthcare Providers:  IncredibleEmployment.be  This test is no t yet approved or cleared by the [place] FDA and  has been authorized for detection and/or diagnosis of SARS-CoV-2 by FDA under an Emergency Use Authorization (EUA). This EUA will remain  in effect (meaning this test can be used) for the duration of the COVID-19 declaration under Section 564(b)(1) of the Act, 21 U.S.C.section 360bbb-3(b)(1), unless the authorization is terminated  or revoked sooner.       Influenza A  by PCR NEGATIVE NEGATIVE Final   Influenza B by PCR NEGATIVE NEGATIVE Final    Comment: (NOTE) The Xpert Xpress SARS-CoV-2/FLU/RSV plus assay is intended as an aid in the diagnosis of influenza from Nasopharyngeal swab specimens and should not be used as a sole basis for treatment. Nasal washings and aspirates are unacceptable for Xpert Xpress SARS-CoV-2/FLU/RSV testing.  Fact Sheet for Patients: EntrepreneurPulse.com.au  Fact Sheet for Healthcare Providers: IncredibleEmployment.be  This test is not yet approved or cleared by the [place] FDA and has been authorized for detection and/or diagnosis of SARS-CoV-2 by FDA under an Emergency Use Authorization (EUA). This EUA will remain in effect (meaning this test can be used) for the duration of  the COVID-19 declaration under Section 564(b)(1) of the Act, 21 U.S.C. section 360bbb-3(b)(1), unless the authorization is terminated or revoked.  Performed at [facility], [address], [place], [place] [address]      Radiological Exams on Admission: CT Head W or Wo Contrast  Result Date: 06/30/2020 CLINICAL DATA:  Cellulitis. Left face and your infection. Left TMJ swelling. Swelling into the submandibular space. EXAM: CT HEAD WITHOUT AND WITH CONTRAST CT MAXILLOFACIAL WITHOUT AND WITH CONTRAST TECHNIQUE: Multidetector CT imaging of the head and maxillofacial structures were performed using the standard protocol without intravenous contrast. Multiplanar CT image reconstructions of the maxillofacial structures were also generated. COMPARISON:  None. FINDINGS: CT HEAD FINDINGS Brain: No acute infarct, hemorrhage, or mass lesion is present. The ventricles are of normal size. No significant white matter lesions are present. No significant extraaxial fluid collection is present. Postcontrast images demonstrate no pathologic enhancement. The brainstem and cerebellum are within normal limits. Vascular:  No hyperdense vessel or unexpected calcification. Skull: Calvarium is intact. No focal lytic or blastic lesions are present. Soft tissue inflammatory changes surround the left ear including the pinna. There is thickening of the skin in the external auditory canal. No fluid collection is present. CT MAXILLOFACIAL FINDINGS Osseous: No fracture or mandibular dislocation. No destructive process. Orbits: Bilateral lens replacements are present. Globes and orbits are otherwise within normal limits. Sinuses: The paranasal sinuses and mastoid air cells are clear. Soft tissues: Skin thickening is present about the left ear. No fluid collection or mass lesion is present. Submandibular gland scratched at there is slight thickening of the left platysma. Submandibular space demonstrates minimal inflammation. Small nodes are present. Subcutaneous fat demonstrates some inflammation over the posterior left face over the parotid gland. 6 mm posterior left parotid lymph node noted. IMPRESSION: 1. Soft tissue inflammatory changes about the left ear including the pinna compatible with acute otitis externa. 2. No fluid collection or mass lesion. 3. Normal CT appearance of the brain. 4. No acute or focal lesion to explain the patient's symptoms. Electronically Signed   By: San Morelle M.D.   On: 06/30/2020 15:29   CT Maxillofacial W Contrast  Result Date: 06/30/2020 CLINICAL DATA:  Cellulitis. Left face and your infection. Left TMJ swelling. Swelling into the submandibular space. EXAM: CT HEAD WITHOUT AND WITH CONTRAST CT MAXILLOFACIAL WITHOUT AND WITH CONTRAST TECHNIQUE: Multidetector CT imaging of the head and maxillofacial structures were performed using the standard protocol without intravenous contrast. Multiplanar CT image reconstructions of the maxillofacial structures were also generated. COMPARISON:  None. FINDINGS: CT HEAD FINDINGS Brain: No acute infarct, hemorrhage, or mass lesion is present. The ventricles are of  normal size. No significant white matter lesions are present. No significant extraaxial fluid collection is present. Postcontrast images demonstrate no pathologic enhancement. The brainstem and cerebellum are within normal limits. Vascular: No hyperdense vessel or unexpected calcification. Skull: Calvarium is intact. No focal lytic or blastic lesions are present. Soft tissue inflammatory changes surround the left ear including the pinna. There is thickening of the skin in the external auditory canal. No fluid collection is present. CT MAXILLOFACIAL FINDINGS Osseous: No fracture or mandibular dislocation. No destructive process. Orbits: Bilateral lens replacements are present. Globes and orbits are otherwise within normal limits. Sinuses: The paranasal sinuses and mastoid air cells are clear. Soft tissues: Skin thickening is present about the left ear. No fluid collection or mass lesion is present. Submandibular gland scratched at there is slight thickening of the left platysma. Submandibular space  demonstrates minimal inflammation. Small nodes are present. Subcutaneous fat demonstrates some inflammation over the posterior left face over the parotid gland. 6 mm posterior left parotid lymph node noted. IMPRESSION: 1. Soft tissue inflammatory changes about the left ear including the pinna compatible with acute otitis externa. 2. No fluid collection or mass lesion. 3. Normal CT appearance of the brain. 4. No acute or focal lesion to explain the patient's symptoms. Electronically Signed   By: San Morelle M.D.   On: 06/30/2020 15:29     EKG:   Not done in ED, will get one.   Assessment/Plan Principal Problem:   Acute actinic otitis externa, left ear Active Problems:   GERD (gastroesophageal reflux disease)   Facial cellulitis   Hyperlipidemia   Acute actinic otitis externa, left ear and left facial cellulitis: Clinically patient is not septic.  No fever or leukocytosis.  Currently hemodynamically  stable.  - Admitted to MedSurg bed as inpatient - Placed on MedSurg bed for observation - Empiric antimicrobial treatment with vancomycin and cefepime (patient received 1 dose of Rocephin in ED) - PRN Zofran for nausea, morphine and Percocet for pain - Blood cultures x 2  - ESR and CRP  Addendum: per RN report, pt appears to have allergic reaction with little pimple bumps newly formed on her neck, shoulder and back.  Patient received 1 dose of vancomycin and Rocephin.  Rocephin was switched to cefepime for coverage of possible Pseudomonas infection. -As needed Benadryl  GERD (gastroesophageal reflux disease) -Protonix  Hyperlipidemia -Lipitor     DVT ppx: SQ Lovenox Code Status: Full code Family Communication: not done, no family member is at bed side.      Disposition Plan:  Anticipate discharge back to previous environment Consults called:  none Admission status and Level of care: Med-Surg:   as inpt       Status is: Inpatient  Remains inpatient appropriate because:Inpatient level of care appropriate due to severity of illness   Dispo: The patient is from: Home              Anticipated d/c is to: Home              Patient currently is not medically stable to d/c.   Difficult to place patient No        Date of Service 06/30/2020    Ivor Costa [facility]   If 7PM-7AM, please contact night-coverage [email or web address] 06/30/2020, 6:31 PM

## 2020-06-30 NOTE — Progress Notes (Signed)
PHARMACY -  BRIEF ANTIBIOTIC NOTE   Pharmacy has received consult(s) for vancomycin from an ED provider.  The patient's profile has been reviewed for ht/wt/allergies/indication/available labs.    One time order(s) placed for vancomycin 1500 mg IV x 1  Further antibiotics/pharmacy consults should be ordered by admitting physician if indicated.                       Thank you,  Dallie Piles 06/30/2020  12:00 PM

## 2020-06-30 NOTE — Progress Notes (Signed)
PHARMACIST - PHYSICIAN COMMUNICATION  CONCERNING:  Enoxaparin (Lovenox) for DVT Prophylaxis    RECOMMENDATION: Patient was prescribed enoxaprin 40mg  q24 hours for VTE prophylaxis.   Filed Weights   06/30/20 1042  Weight: 83.5 kg (184 lb)    Body mass index is 33.65 kg/m.  Estimated Creatinine Clearance: 65.6 mL/min (by C-G formula based on SCr of 0.64 mg/dL).   Based on [place] patient is candidate for enoxaparin 0.5mg /kg TBW SQ every 24 hours based on BMI being >30.  DESCRIPTION: Pharmacy has adjusted enoxaparin dose per [facility] policy.  Patient is now receiving enoxaparin 42.5 mg every 24 hours   Benn Moulder, PharmD Pharmacy Resident  06/30/2020 5:49 PM

## 2020-06-30 NOTE — ED Provider Notes (Signed)
[facility] Emergency Department Provider Note  ____________________________________________  Time seen: Approximately 3:31 PM  I have reviewed the triage vital signs and the nursing notes.   HISTORY  Chief Complaint Left facial swelling   HPI Abigail Delacruz is a 69 y.o. female with a past history of hyperlipidemia who comes to the ED c/o left ear and left facial pain and swelling.  This is associated with weakenss of the left side of the face.    No vision changes  or peripheral weakness or paresthesia.  No recent trauma. Patient reports that she was recently in [place], returned home about a week ago, subsequently developed pain and swelling of the left ear.  Was given Flonase and Ciprodex, and symptoms continued worsening to involve the left side of the face.  Was seen by PCP yesterday and received IM Rocephin and Decadron, symptoms still worsening today.  Denies any shortness of breath or difficulty swallowing.  Denies severe headaches, changes in balance or coordination or alteration in alertness.      Past Medical History:  Diagnosis Date  . Cataract   . Hyperlipidemia      Patient Active Problem List   Diagnosis Date Noted  . Acute actinic otitis externa, left ear 06/30/2020  . GERD (gastroesophageal reflux disease) 06/26/2020  . Hyperlipidemia, mixed 10/01/2017  . Chronic left shoulder pain 05/23/2017     Past Surgical History:  Procedure Laterality Date  . APPENDECTOMY  1971  . CATARACT EXTRACTION W/PHACO Right 09/08/2017   Procedure: CATARACT EXTRACTION PHACO AND INTRAOCULAR LENS PLACEMENT ([place])  RIGHT;  Surgeon: Eulogio Bear, MD;  Location: [facility];  Service: Ophthalmology;  Laterality: Right;  . CATARACT EXTRACTION W/PHACO Left 09/29/2017   Procedure: CATARACT EXTRACTION PHACO AND INTRAOCULAR LENS PLACEMENT ([place]) LEFT;  Surgeon: Eulogio Bear, MD;  Location: [facility];  Service: Ophthalmology;   Laterality: Left;  . COLONOSCOPY WITH PROPOFOL N/A 10/20/2017   Procedure: COLONOSCOPY WITH PROPOFOL;  Surgeon: Lucilla Lame, MD;  Location: [facility];  Service: Endoscopy;  Laterality: N/A;     Prior to Admission medications   Medication Sig Start Date End Date Taking? Authorizing Provider  atorvastatin (LIPITOR) 20 MG tablet Take 1 tablet (20 mg total) by mouth daily. 05/26/20   Karamalegos, Devonne Doughty, DO  ciprofloxacin-dexamethasone (CIPRODEX) OTIC suspension Place 4 drops into the left ear 2 (two) times daily for 7 days. 06/29/20 07/06/20  Jearld Fenton, NP  fluticasone (FLONASE) 50 MCG/ACT nasal spray Place 2 sprays into both nostrils daily.    [provider]  ibuprofen (ADVIL) 800 MG tablet Take 800 mg by mouth every 8 (eight) hours as needed.    [provider]  Multiple Vitamins-Minerals (MULTIVITAMIN ADULT) CHEW Chew by mouth.    [provider]  omeprazole (PRILOSEC) 20 MG capsule Take 1 capsule (20 mg total) by mouth daily for 14 days. 06/26/20 07/10/20  Jearld Fenton, NP  traMADol (ULTRAM) 50 MG tablet Take 1 tablet (50 mg total) by mouth every 8 (eight) hours as needed for up to 5 days. 06/29/20 07/04/20  Jearld Fenton, NP     Allergies Patient has no known allergies.   Family History  Problem Relation Age of Onset  . Kidney failure Mother   . Cirrhosis Father   . Alcohol abuse Father   . Diabetes Brother   . Hypertension Brother   . Arthritis Brother   . Breast cancer Neg Hx     Social  History Social History   Tobacco Use  . Smoking status: Former Smoker    Quit date: 1974    Years since quitting: 48.3  . Smokeless tobacco: Never Used  Vaping Use  . Vaping Use: Never used  Substance Use Topics  . Alcohol use: Yes    Comment: social 1x/yr  . Drug use: Never    Review of Systems  Constitutional:   No fever or chills.  ENT:   No sore throat. No rhinorrhea. Cardiovascular:   No chest pain or syncope. Respiratory:   No  dyspnea or cough. Gastrointestinal:   Negative for abdominal pain, vomiting and diarrhea.  Musculoskeletal:   Negative for focal pain or swelling All other systems reviewed and are negative except as documented above in ROS and HPI.  ____________________________________________   PHYSICAL EXAM:  VITAL SIGNS: ED Triage Vitals [06/30/20 1042]  Enc Vitals Group     BP 133/76     Pulse Rate 72     Resp 20     Temp 98.1 F (36.7 C)     Temp Source Oral     SpO2 95 %     Weight 184 lb (83.5 kg)     Height 5\' 2"  (1.575 m)     Head Circumference      Peak Flow      Pain Score 8     Pain Loc      Pain Edu?      Excl. in [place]?     Vital signs reviewed, nursing assessments reviewed.   Constitutional:   Alert and oriented. Non-toxic appearance. Eyes:   Conjunctivae are normal. EOMI. PERRL. ENT      Head:   Normocephalic with pronounced swelling of the left face in the area of the TMJ and angle of the jaw as well as the external ear canal and auricle.  There is tender and warm and erythematous with purulent blisters erupting..      Nose:   Purulent appearing erythematous plaque on the tip of the nose.      Mouth/Throat:   Normal, moist mucosa, no peritonsillar mass or gingival swelling.      Neck:   No meningismus. Full ROM.  There is submandibular tenderness on the left.  No neck crepitus Hematological/Lymphatic/Immunilogical:   No cervical lymphadenopathy. Cardiovascular:   RRR. Symmetric bilateral radial and DP pulses.  No murmurs. Cap refill less than 2 seconds. Respiratory:   Normal respiratory effort without tachypnea/retractions. Breath sounds are clear and equal bilaterally. No wheezes/rales/rhonchi. Gastrointestinal:   Soft and nontender. Non distended. There is no CVA tenderness.  No rebound, rigidity, or guarding.  Musculoskeletal:   Normal range of motion in all extremities. No joint effusions.  No lower extremity tenderness.  No edema. Neurologic:   Normal speech and  language.  Motor grossly intact. No acute focal neurologic deficits are appreciated.  Skin:    Skin is warm, dry and intact. No rash noted.  No petechiae, purpura, or bullae.  ____________________________________________    LABS (pertinent positives/negatives) (all labs ordered are listed, but only abnormal results are displayed) Labs Reviewed  BASIC METABOLIC PANEL - Abnormal; Notable for the following components:      Result Value   Glucose, Bld 105 (*)    All other components within normal limits  RESP PANEL BY RT-PCR (FLU A&B, COVID) ARPGX2  CULTURE, BLOOD (ROUTINE X 2)  CULTURE, BLOOD (ROUTINE X 2)  CBC WITH DIFFERENTIAL/PLATELET   ____________________________________________   EKG  ____________________________________________    RADIOLOGY  CT Head W or Wo Contrast  Result Date: 06/30/2020 CLINICAL DATA:  Cellulitis. Left face and your infection. Left TMJ swelling. Swelling into the submandibular space. EXAM: CT HEAD WITHOUT AND WITH CONTRAST CT MAXILLOFACIAL WITHOUT AND WITH CONTRAST TECHNIQUE: Multidetector CT imaging of the head and maxillofacial structures were performed using the standard protocol without intravenous contrast. Multiplanar CT image reconstructions of the maxillofacial structures were also generated. COMPARISON:  None. FINDINGS: CT HEAD FINDINGS Brain: No acute infarct, hemorrhage, or mass lesion is present. The ventricles are of normal size. No significant white matter lesions are present. No significant extraaxial fluid collection is present. Postcontrast images demonstrate no pathologic enhancement. The brainstem and cerebellum are within normal limits. Vascular: No hyperdense vessel or unexpected calcification. Skull: Calvarium is intact. No focal lytic or blastic lesions are present. Soft tissue inflammatory changes surround the left ear including the pinna. There is thickening of the skin in the external auditory canal. No fluid collection is present.  CT MAXILLOFACIAL FINDINGS Osseous: No fracture or mandibular dislocation. No destructive process. Orbits: Bilateral lens replacements are present. Globes and orbits are otherwise within normal limits. Sinuses: The paranasal sinuses and mastoid air cells are clear. Soft tissues: Skin thickening is present about the left ear. No fluid collection or mass lesion is present. Submandibular gland scratched at there is slight thickening of the left platysma. Submandibular space demonstrates minimal inflammation. Small nodes are present. Subcutaneous fat demonstrates some inflammation over the posterior left face over the parotid gland. 6 mm posterior left parotid lymph node noted. IMPRESSION: 1. Soft tissue inflammatory changes about the left ear including the pinna compatible with acute otitis externa. 2. No fluid collection or mass lesion. 3. Normal CT appearance of the brain. 4. No acute or focal lesion to explain the patient's symptoms. Electronically Signed   By: San Morelle M.D.   On: 06/30/2020 15:29   CT Maxillofacial W Contrast  Result Date: 06/30/2020 CLINICAL DATA:  Cellulitis. Left face and your infection. Left TMJ swelling. Swelling into the submandibular space. EXAM: CT HEAD WITHOUT AND WITH CONTRAST CT MAXILLOFACIAL WITHOUT AND WITH CONTRAST TECHNIQUE: Multidetector CT imaging of the head and maxillofacial structures were performed using the standard protocol without intravenous contrast. Multiplanar CT image reconstructions of the maxillofacial structures were also generated. COMPARISON:  None. FINDINGS: CT HEAD FINDINGS Brain: No acute infarct, hemorrhage, or mass lesion is present. The ventricles are of normal size. No significant white matter lesions are present. No significant extraaxial fluid collection is present. Postcontrast images demonstrate no pathologic enhancement. The brainstem and cerebellum are within normal limits. Vascular: No hyperdense vessel or unexpected calcification.  Skull: Calvarium is intact. No focal lytic or blastic lesions are present. Soft tissue inflammatory changes surround the left ear including the pinna. There is thickening of the skin in the external auditory canal. No fluid collection is present. CT MAXILLOFACIAL FINDINGS Osseous: No fracture or mandibular dislocation. No destructive process. Orbits: Bilateral lens replacements are present. Globes and orbits are otherwise within normal limits. Sinuses: The paranasal sinuses and mastoid air cells are clear. Soft tissues: Skin thickening is present about the left ear. No fluid collection or mass lesion is present. Submandibular gland scratched at there is slight thickening of the left platysma. Submandibular space demonstrates minimal inflammation. Small nodes are present. Subcutaneous fat demonstrates some inflammation over the posterior left face over the parotid gland. 6 mm posterior left parotid lymph node noted. IMPRESSION: 1. Soft tissue inflammatory changes about  the left ear including the pinna compatible with acute otitis externa. 2. No fluid collection or mass lesion. 3. Normal CT appearance of the brain. 4. No acute or focal lesion to explain the patient's symptoms. Electronically Signed   By: San Morelle M.D.   On: 06/30/2020 15:29    ____________________________________________   PROCEDURES Procedures  ____________________________________________  DIFFERENTIAL DIAGNOSIS   Mastoiditis, intracranial abscess, jaw abscess, facial cellulitis  CLINICAL IMPRESSION / ASSESSMENT AND PLAN / ED COURSE  Medications ordered in the ED: Medications  vancomycin (VANCOREADY) IVPB 1500 mg/300 mL (1,500 mg Intravenous New Bag/Given 06/30/20 1434)  cefTRIAXone (ROCEPHIN) 2 g in sodium chloride 0.9 % 100 mL IVPB (0 g Intravenous Stopped 06/30/20 1317)  morphine 4 MG/ML injection 4 mg (4 mg Intravenous Given 06/30/20 1247)  ondansetron (ZOFRAN) injection 4 mg (4 mg Intravenous Given 06/30/20 1247)   HYDROmorphone (DILAUDID) injection 1 mg (1 mg Intravenous Given 06/30/20 1429)  iohexol (OMNIPAQUE) 300 MG/ML solution 75 mL (75 mLs Intravenous Contrast Given 06/30/20 1452)    Pertinent labs & imaging results that were available during my care of the patient were reviewed by me and considered in my medical decision making (see chart for details).  Abigail Delacruz was evaluated in Emergency Department on 06/30/2020 for the symptoms described in the history of present illness. She was evaluated in the context of the global COVID-19 pandemic, which necessitated consideration that the patient might be at risk for infection with the SARS-CoV-2 virus that causes COVID-19. Institutional protocols and algorithms that pertain to the evaluation of patients at risk for COVID-19 are in a state of rapid change based on information released by regulatory bodies including the CDC and federal and state organizations. These policies and algorithms were followed during the patient's care in the ED.   Patient presents with pain swelling erythema of the left ear and side of the face, consistent with facial cellulitis.  CT imaging obtained which is negative for intracranial extension or abscess or other complications.  She is maintaining her airway, no evidence of airway threat on imaging or exam.  Due to her age and outpatient treatment failure so far, recommended admission and patient agrees.  Case discussed with the hospitalist.  No risk factors for Pseudomonas, I think this is due to gram-positive skin flora for which patient is receiving vancomycin and ceftriaxone.      ____________________________________________   FINAL CLINICAL IMPRESSION(S) / ED DIAGNOSES    Final diagnoses:  Facial cellulitis     ED Discharge Orders    None      Portions of this note were generated with dragon dictation software. Dictation errors may occur despite best attempts at proofreading.   Carrie Mew, MD 06/30/20  1550

## 2020-06-30 NOTE — Progress Notes (Signed)
Pharmacy Antibiotic Note  Abigail Delacruz is a 70 y.o. female admitted on 06/30/2020 with acute otitis externa and facial cellulitis.  Pharmacy has been consulted for cefepime and vancomycin dosing. In the ED she received 2 grams IV ceftriaxone and 1500 mg IV vancomycin.  Plan:  1) start cefepime 2 grams IV every 8 hours  2) start vancomycin 1000 mg IV Q 24 hrs  Goal AUC 400-550  Expected AUC: 505.8  SCr used: 0.80 (rounded up)   Daily renal function assessment while on IV vancomycin  Levels as clinically indicated   Height: 5\' 2"  (157.5 cm) (Simultaneous filing. User may not have seen previous data.) Weight: 83.5 kg (184 lb) (Simultaneous filing. User may not have seen previous data.) IBW/kg (Calculated) : 50.1  Temp (24hrs), Avg:98.1 F (36.7 C), Min:98.1 F (36.7 C), Max:98.1 F (36.7 C)  Recent Labs  Lab 06/30/20 1054  WBC 7.9  CREATININE 0.64    Estimated Creatinine Clearance: 65.6 mL/min (by C-G formula based on SCr of 0.64 mg/dL).    No Known Allergies  Antimicrobials this admission: vancomycin 5/6 >>  cefepime 5/6 >>   Microbiology results: 5/6 BCx: pending 5/6 SARS CoV-2: negative 5/6 influenza A/B  Thank you for allowing pharmacy to be a part of this patient's care.  Dallie Piles 06/30/2020 3:54 PM

## 2020-06-30 NOTE — Progress Notes (Addendum)
Abigail Delacruz Nurse reports concern with facial weakness and rash Patient with right otitis externa and facial cellulitis who developed rash on chest neck head and back after receiving rocephin and vancomycin.  Rocephin now changed to cefepime and VSS. Received one dose benadryl.  Patient with recent out of country visit to [place].  She developed drainage from her left ear. Patient was seen by PCP twice and treated with Flonase and Ciprodex, IM Rocephin and Decadron, but symptoms has been worsening progressively. Bedside patient reports significant worsening of  symptoms since yesterday.  She has right sided facial weakness that she states has worsened since yesterday, and nose has evident erythema. Features are consistant with [name].   She denies known tick exposure or recent viral illness. Review of diseases of concern in [place] reviewed, varicella may be of concern with new onset rash and exposure time to rash onset may fit.  Sed rate normal  CT Head/CT Maxillofacial W/WO contrast IMPRESSION: 1. Soft tissue inflammatory changes about the left ear including the pinna compatible with acute otitis externa. 2. No fluid collection or mass lesion. 3. Normal CT appearance of the brain. 4. No acute or focal lesion to explain the patient's symptoms.  Plan  PCR Viral resp panel F/u CT with MRI HSV I, II and varicella  AB IgG Prednisone 60 mg X 7 days Consider adding antiviral if symptoms more consistant with viral illness prevail   UPDATE MRI IMPRESSION: 1. Contrast enhancement of the left ear and external auditory canal soft tissues may indicate otitis externa. 2. Mild contrast enhancement of the canalicular segment, geniculate ganglion and mastoid segment of the left facial nerve. This is nonspecific and, while it may be seen in [name], the segments may also enhance in normal patients. 3. Findings of mild chronic microvascular ischemia.  History of + ANA and rheumatology work  up who presumed patient to have no symptoms and did not diagnose Lupus.  Patient reports that she had ear problem in past after travel.  Also chart review shows patient to have family history of renal failure and rheumatoid arthritis.  ANA anti CCP RF and CRP ordered - based on results can rerefer to rheumatology Complements can be considered in obtaining  Patient currently with improvement in facial weakness

## 2020-06-30 NOTE — ED Triage Notes (Signed)
First Nurse Note:  C/O 5 day history of left ear pain.  States has seen PCP twice this week and symptoms continue to worsen.  Left ear swelling noted.  Patient also c/o dizziness and unable to close left eye and left facial droop since yesterday.  MAE equally and strong  NAD.  Speech clear.

## 2020-06-30 NOTE — Progress Notes (Signed)
Paged MD, pt appears to be having an allergic reaction with pimple looking bumps on her neck and back.

## 2020-06-30 NOTE — Telephone Encounter (Signed)
Agree with ED eval, will review ED note once completed.

## 2020-07-01 LAB — CBC
HCT: 41.6 % (ref 36.0–46.0)
Hemoglobin: 14.2 g/dL (ref 12.0–15.0)
MCH: 31.7 pg (ref 26.0–34.0)
MCHC: 34.1 g/dL (ref 30.0–36.0)
MCV: 92.9 fL (ref 80.0–100.0)
Platelets: 242 10*3/uL (ref 150–400)
RBC: 4.48 MIL/uL (ref 3.87–5.11)
RDW: 12.5 % (ref 11.5–15.5)
WBC: 9.9 10*3/uL (ref 4.0–10.5)
nRBC: 0 % (ref 0.0–0.2)

## 2020-07-01 LAB — BASIC METABOLIC PANEL
Anion gap: 10 (ref 5–15)
BUN: 23 mg/dL (ref 8–23)
CO2: 25 mmol/L (ref 22–32)
Calcium: 9.2 mg/dL (ref 8.9–10.3)
Chloride: 101 mmol/L (ref 98–111)
Creatinine, Ser: 0.77 mg/dL (ref 0.44–1.00)
GFR, Estimated: >60 mL/min (ref >60)
Glucose, Bld: 153 mg/dL — ABNORMAL HIGH (ref 70–99)
Potassium: 4.5 mmol/L (ref 3.5–5.1)
Sodium: 136 mmol/L (ref 135–145)

## 2020-07-01 LAB — C-REACTIVE PROTEIN
CRP: 0.6 mg/dL (ref <1.0)
CRP: 0.6 mg/dL (ref <1.0)

## 2020-07-01 LAB — HIV ANTIBODY (ROUTINE TESTING W REFLEX): HIV Screen 4th Generation wRfx: NONREACTIVE

## 2020-07-01 MED ORDER — GABAPENTIN 300 MG PO CAPS
300.0000 mg | ORAL_CAPSULE | Freq: Three times a day (TID) | ORAL | Status: DC
  Administered 2020-07-01 – 2020-07-04 (×9): 300 mg via ORAL
  Filled 2020-07-01 (×9): qty 1

## 2020-07-01 MED ORDER — PREDNISONE 20 MG PO TABS
40.0000 mg | ORAL_TABLET | Freq: Two times a day (BID) | ORAL | Status: DC
  Administered 2020-07-01 – 2020-07-04 (×6): 40 mg via ORAL
  Filled 2020-07-01 (×6): qty 2

## 2020-07-01 MED ORDER — VALACYCLOVIR HCL 500 MG PO TABS
1000.0000 mg | ORAL_TABLET | Freq: Three times a day (TID) | ORAL | Status: DC
  Administered 2020-07-01 – 2020-07-04 (×8): 1000 mg via ORAL
  Filled 2020-07-01 (×17): qty 2

## 2020-07-01 MED ORDER — KETOROLAC TROMETHAMINE 15 MG/ML IJ SOLN
15.0000 mg | Freq: Four times a day (QID) | INTRAMUSCULAR | Status: AC | PRN
  Administered 2020-07-01 – 2020-07-02 (×4): 15 mg via INTRAVENOUS
  Filled 2020-07-01 (×8): qty 1

## 2020-07-01 NOTE — Progress Notes (Addendum)
PROGRESS NOTE    Abigail Delacruz  UXN:[id number] DOB: 07/08/1950 DOA: 06/30/2020 PCP: Jearld Fenton, NP  129A/129A-AA   Assessment & Plan:   Principal Problem:   Acute actinic otitis externa, left ear Active Problems:   GERD (gastroesophageal reflux disease)   Facial cellulitis   Hyperlipidemia   Abigail Delacruz is a 70 y.o. female with medical history significant of hyperlipidemia, GERD, cataracts, who presents with left ear pain, left facial swelling.  Patient states that she was recently in [place], returned home about a week ago, subsequently developed pain left ear. Her left ear has been progressively swelling.  Patient has a little clear drainage from left ear.  No fever or chills.  She also developed progressively worsening left facial pain and swelling.This is associated with weakenss of the left side of the face.  No vision change.  No unilateral numbness or tingling in extremities.  Patient was seen by PCP twice and treated with Flonase and Ciprodex, IM Rocephin and Decadron, but symptoms has been worsening progressively.    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   --Discussed with oncall ID Dr. Baxter Flattery who agreed with the dx. PLAN:  --start Valtrex 1g TID --continue prednisone 40 mg BID --cont Percocet PRN for pain --start gabapentin 300 mg TID --IV toradol PRN for pain  # Ruled out otitis externa  # Ruled out left facial cellulitis --d/c abx, per ID rec  GERD --cont PPI  HLD --cont statin   DVT prophylaxis: Lovenox SQ Code Status: Full code  Family Communication: daughter updated at bedside Level of care: Med-Surg Dispo:   The patient is from: home Anticipated d/c is to: home Anticipated d/c date is: 1-2 days Patient currently is not medically ready to d/c due to:  need to see improvement after starting treatment   Subjective and Interval History:  Developed small vesicles under her chin and neck overnight.  Pt complained of severe burning pain in her left ear even after receiving Percocet.  Also reported she couldn't completely close her left eye without using her hand to manually close it.   Objective: Vitals:   07/01/20 0050 07/01/20 0548 07/01/20 0908 07/01/20 1650  BP: 139/81 134/75 (!) 95/49 123/71  Pulse: 73 65 67 62  Resp: 16 16 16 17   Temp: 98.1 F (36.7 C) 98.6 F (37 C) 99.2 F (37.3 C) 99.3 F (37.4 C)  TempSrc: Oral Oral Oral Oral  SpO2: 96% 94% 93% 92%  Weight:      Height:        Intake/Output Summary (Last 24 hours) at 07/01/2020 1806 Last data filed at 07/01/2020 1421 Gross per 24 hour  Intake 480 ml  Output --  Net 480 ml   Filed Weights   06/30/20 1042  Weight: 83.5 kg    Examination:   Constitutional: NAD, AAOx3 HEENT: conjunctivae and lids normal, EOMI CV: No cyanosis.   RESP: normal respiratory effort, on RA Extremities: No effusions, edema in BLE SKIN: warm, dry.  Vesicles shown below. Neuro: Left facial droop, sensation intact Psych: Normal mood and affect.  Appropriate judgement and reason          Data Reviewed: I have personally reviewed following labs and imaging studies  CBC: Recent Labs  Lab 06/30/20 1054 07/01/20 0405  WBC 7.9 9.9  NEUTROABS 5.8  --  HGB 14.4 14.2  HCT 41.0 41.6  MCV 89.7 92.9  PLT 240 196   Basic Metabolic Panel: Recent Labs  Lab 06/30/20 1054 07/01/20 0405  NA 137 136  K 3.8 4.5  CL 102 101  CO2 27 25  GLUCOSE 105* 153*  BUN 19 23  CREATININE 0.64 0.77  CALCIUM 9.2 9.2   GFR: Estimated Creatinine Clearance: 65.6 mL/min (by C-G formula based on SCr of 0.77 mg/dL). Liver Function Tests: No results for input(s): AST, ALT, ALKPHOS, BILITOT, PROT, ALBUMIN in the last 168 hours. No results for input(s): LIPASE, AMYLASE in the last 168 hours. No  results for input(s): AMMONIA in the last 168 hours. Coagulation Profile: No results for input(s): INR, PROTIME in the last 168 hours. Cardiac Enzymes: No results for input(s): CKTOTAL, CKMB, CKMBINDEX, TROPONINI in the last 168 hours. BNP (last 3 results) No results for input(s): PROBNP in the last 8760 hours. HbA1C: No results for input(s): HGBA1C in the last 72 hours. CBG: No results for input(s): GLUCAP in the last 168 hours. Lipid Profile: No results for input(s): CHOL, HDL, LDLCALC, TRIG, CHOLHDL, LDLDIRECT in the last 72 hours. Thyroid Function Tests: No results for input(s): TSH, T4TOTAL, FREET4, T3FREE, THYROIDAB in the last 72 hours. Anemia Panel: No results for input(s): VITAMINB12, FOLATE, FERRITIN, TIBC, IRON, RETICCTPCT in the last 72 hours. Sepsis Labs: No results for input(s): PROCALCITON, LATICACIDVEN in the last 168 hours.  Recent Results (from the past 240 hour(s))  Culture, blood (routine x 2)     Status: None (Preliminary result)   Collection Time: 06/30/20 12:38 PM   Specimen: BLOOD  Result Value Ref Range Status   Specimen Description BLOOD LEFT ANTECUBITAL  Final   Special Requests   Final    BOTTLES DRAWN AEROBIC AND ANAEROBIC Blood Culture results may not be optimal due to an inadequate volume of blood received in culture bottles   Culture   Final    NO GROWTH < 24 HOURS Performed at [facility], [address] [place], [place] [address]    Report Status PENDING  Incomplete  Resp Panel by RT-PCR (Flu A&B, Covid) Nasopharyngeal Swab     Status: None   Collection Time: 06/30/20 12:38 PM   Specimen: Nasopharyngeal Swab; Nasopharyngeal(NP) swabs in vial transport medium  Result Value Ref Range Status   SARS Coronavirus 2 by RT PCR NEGATIVE NEGATIVE Final    Comment: (NOTE) SARS-CoV-2 target nucleic acids are NOT DETECTED.  The SARS-CoV-2 RNA is generally detectable in upper respiratory specimens during the acute phase of infection. The  lowest concentration of SARS-CoV-2 viral copies this assay can detect is 138 copies/mL. A negative result does not preclude SARS-Cov-2 infection and should not be used as the sole basis for treatment or other patient management decisions. A negative result may occur with  improper specimen collection/handling, submission of specimen other than nasopharyngeal swab, presence of viral mutation(s) within the areas targeted by this assay, and inadequate number of viral copies(<138 copies/mL). A negative result must be combined with clinical observations, patient history, and epidemiological information. The expected result is Negative.  Fact Sheet for Patients:  EntrepreneurPulse.com.au  Fact Sheet for Healthcare Providers:  IncredibleEmployment.be  This test is no t yet approved or cleared by the [place] FDA and  has been authorized for detection and/or diagnosis of SARS-CoV-2 by FDA under an Emergency Use Authorization (EUA). This EUA will remain  in effect (meaning this test can be used) for the duration of the  COVID-19 declaration under Section 564(b)(1) of the Act, 21 U.S.C.section 360bbb-3(b)(1), unless the authorization is terminated  or revoked sooner.       Influenza A by PCR NEGATIVE NEGATIVE Final   Influenza B by PCR NEGATIVE NEGATIVE Final    Comment: (NOTE) The Xpert Xpress SARS-CoV-2/FLU/RSV plus assay is intended as an aid in the diagnosis of influenza from Nasopharyngeal swab specimens and should not be used as a sole basis for treatment. Nasal washings and aspirates are unacceptable for Xpert Xpress SARS-CoV-2/FLU/RSV testing.  Fact Sheet for Patients: EntrepreneurPulse.com.au  Fact Sheet for Healthcare Providers: IncredibleEmployment.be  This test is not yet approved or cleared by the [place] FDA and has been authorized for detection and/or diagnosis of SARS-CoV-2 by FDA under  an Emergency Use Authorization (EUA). This EUA will remain in effect (meaning this test can be used) for the duration of the COVID-19 declaration under Section 564(b)(1) of the Act, 21 U.S.C. section 360bbb-3(b)(1), unless the authorization is terminated or revoked.  Performed at [facility], [address], [place], [place] [address]       Radiology Studies: CT Head W or Wo Contrast  Result Date: 06/30/2020 CLINICAL DATA:  Cellulitis. Left face and your infection. Left TMJ swelling. Swelling into the submandibular space. EXAM: CT HEAD WITHOUT AND WITH CONTRAST CT MAXILLOFACIAL WITHOUT AND WITH CONTRAST TECHNIQUE: Multidetector CT imaging of the head and maxillofacial structures were performed using the standard protocol without intravenous contrast. Multiplanar CT image reconstructions of the maxillofacial structures were also generated. COMPARISON:  None. FINDINGS: CT HEAD FINDINGS Brain: No acute infarct, hemorrhage, or mass lesion is present. The ventricles are of normal size. No significant white matter lesions are present. No significant extraaxial fluid collection is present. Postcontrast images demonstrate no pathologic enhancement. The brainstem and cerebellum are within normal limits. Vascular: No hyperdense vessel or unexpected calcification. Skull: Calvarium is intact. No focal lytic or blastic lesions are present. Soft tissue inflammatory changes surround the left ear including the pinna. There is thickening of the skin in the external auditory canal. No fluid collection is present. CT MAXILLOFACIAL FINDINGS Osseous: No fracture or mandibular dislocation. No destructive process. Orbits: Bilateral lens replacements are present. Globes and orbits are otherwise within normal limits. Sinuses: The paranasal sinuses and mastoid air cells are clear. Soft tissues: Skin thickening is present about the left ear. No fluid collection or mass lesion is present. Submandibular gland scratched  at there is slight thickening of the left platysma. Submandibular space demonstrates minimal inflammation. Small nodes are present. Subcutaneous fat demonstrates some inflammation over the posterior left face over the parotid gland. 6 mm posterior left parotid lymph node noted. IMPRESSION: 1. Soft tissue inflammatory changes about the left ear including the pinna compatible with acute otitis externa. 2. No fluid collection or mass lesion. 3. Normal CT appearance of the brain. 4. No acute or focal lesion to explain the patient's symptoms. Electronically Signed   By: San Morelle M.D.   On: 06/30/2020 15:29   MR BRAIN W WO CONTRAST  Result Date: 07/01/2020 CLINICAL DATA:  Left facial pain.  CN 7 neuropathy. EXAM: MRI HEAD WITHOUT AND WITH CONTRAST TECHNIQUE: Multiplanar, multiecho pulse sequences of the brain and surrounding structures were obtained without and with intravenous contrast. CONTRAST:  7.64mL GADAVIST GADOBUTROL 1 MMOL/ML IV SOLN COMPARISON:  None. FINDINGS: Brain: No acute infarct, mass effect or extra-axial collection. No acute or chronic hemorrhage. There is multifocal hyperintense T2-weighted signal within the white matter. Parenchymal volume and  CSF spaces are normal. The midline structures are normal. There is mild contrast enhancement of the canalicular segment, geniculate ganglion and mastoid segment of the left facial nerve. Vascular: Major flow voids are preserved. Skull and upper cervical spine: There is mild hyperintense T2-weighted signal and contrast enhancement of the soft tissues of the left ear and external auditory canal. Sinuses/Orbits:No paranasal sinus fluid levels or advanced mucosal thickening. No mastoid or middle ear effusion. Normal orbits. IMPRESSION: 1. Contrast enhancement of the left ear and external auditory canal soft tissues may indicate otitis externa. 2. Mild contrast enhancement of the canalicular segment, geniculate ganglion and mastoid segment of the left  facial nerve. This is nonspecific and, while it may be seen in [name], the segments may also enhance in normal patients. 3. Findings of mild chronic microvascular ischemia. Electronically Signed   By: Ulyses Jarred M.D.   On: 07/01/2020 00:31   CT Maxillofacial W Contrast  Result Date: 06/30/2020 CLINICAL DATA:  Cellulitis. Left face and your infection. Left TMJ swelling. Swelling into the submandibular space. EXAM: CT HEAD WITHOUT AND WITH CONTRAST CT MAXILLOFACIAL WITHOUT AND WITH CONTRAST TECHNIQUE: Multidetector CT imaging of the head and maxillofacial structures were performed using the standard protocol without intravenous contrast. Multiplanar CT image reconstructions of the maxillofacial structures were also generated. COMPARISON:  None. FINDINGS: CT HEAD FINDINGS Brain: No acute infarct, hemorrhage, or mass lesion is present. The ventricles are of normal size. No significant white matter lesions are present. No significant extraaxial fluid collection is present. Postcontrast images demonstrate no pathologic enhancement. The brainstem and cerebellum are within normal limits. Vascular: No hyperdense vessel or unexpected calcification. Skull: Calvarium is intact. No focal lytic or blastic lesions are present. Soft tissue inflammatory changes surround the left ear including the pinna. There is thickening of the skin in the external auditory canal. No fluid collection is present. CT MAXILLOFACIAL FINDINGS Osseous: No fracture or mandibular dislocation. No destructive process. Orbits: Bilateral lens replacements are present. Globes and orbits are otherwise within normal limits. Sinuses: The paranasal sinuses and mastoid air cells are clear. Soft tissues: Skin thickening is present about the left ear. No fluid collection or mass lesion is present. Submandibular gland scratched at there is slight thickening of the left platysma. Submandibular space demonstrates minimal inflammation. Small nodes are  present. Subcutaneous fat demonstrates some inflammation over the posterior left face over the parotid gland. 6 mm posterior left parotid lymph node noted. IMPRESSION: 1. Soft tissue inflammatory changes about the left ear including the pinna compatible with acute otitis externa. 2. No fluid collection or mass lesion. 3. Normal CT appearance of the brain. 4. No acute or focal lesion to explain the patient's symptoms. Electronically Signed   By: San Morelle M.D.   On: 06/30/2020 15:29     Scheduled Meds: . atorvastatin  20 mg Oral QHS  . enoxaparin (LOVENOX) injection  0.5 mg/kg Subcutaneous Q24H  . gabapentin  300 mg Oral TID  . multivitamin with minerals  1 tablet Oral Daily  . predniSONE  40 mg Oral BID WC  . valACYclovir  1,000 mg Oral TID   Continuous Infusions:   LOS: 1 day     Enzo Bi, MD [facility] If 7PM-7AM, please contact night-coverage 07/01/2020, 6:06 PM

## 2020-07-01 NOTE — Plan of Care (Signed)
  Problem: Education: Goal: Knowledge of General Education information will improve Description Including pain rating scale, medication(s)/side effects and non-pharmacologic comfort measures Outcome: Progressing   

## 2020-07-02 LAB — VARICELLA ZOSTER ANTIBODY, IGG: Varicella IgG: 3756 index (ref >165)

## 2020-07-02 LAB — RHEUMATOID FACTOR: Rheumatoid fact SerPl-aCnc: <10 IU/mL (ref <14.0)

## 2020-07-02 LAB — CBC
HCT: 40.5 % (ref 36.0–46.0)
Hemoglobin: 14.4 g/dL (ref 12.0–15.0)
MCH: 32.4 pg (ref 26.0–34.0)
MCHC: 35.6 g/dL (ref 30.0–36.0)
MCV: 91.2 fL (ref 80.0–100.0)
Platelets: 248 10*3/uL (ref 150–400)
RBC: 4.44 MIL/uL (ref 3.87–5.11)
RDW: 12 % (ref 11.5–15.5)
WBC: 10.1 10*3/uL (ref 4.0–10.5)
nRBC: 0 % (ref 0.0–0.2)

## 2020-07-02 LAB — BASIC METABOLIC PANEL
Anion gap: 8 (ref 5–15)
BUN: 27 mg/dL — ABNORMAL HIGH (ref 8–23)
CO2: 27 mmol/L (ref 22–32)
Calcium: 9.2 mg/dL (ref 8.9–10.3)
Chloride: 102 mmol/L (ref 98–111)
Creatinine, Ser: 0.65 mg/dL (ref 0.44–1.00)
GFR, Estimated: >60 mL/min (ref >60)
Glucose, Bld: 140 mg/dL — ABNORMAL HIGH (ref 70–99)
Potassium: 5 mmol/L (ref 3.5–5.1)
Sodium: 137 mmol/L (ref 135–145)

## 2020-07-02 LAB — PROCALCITONIN: Procalcitonin: <0.1 ng/mL

## 2020-07-02 LAB — MAGNESIUM: Magnesium: 2.3 mg/dL (ref 1.7–2.4)

## 2020-07-02 LAB — HSV(HERPES SIMPLEX VRS) I + II AB-IGG
HSV 1 Glycoprotein G Ab, IgG: 55.2 index — ABNORMAL HIGH (ref 0.00–0.90)
HSV 2 Glycoprotein G Ab, IgG: 18.5 index — ABNORMAL HIGH (ref 0.00–0.90)

## 2020-07-02 NOTE — Progress Notes (Signed)
PROGRESS NOTE    Abigail Delacruz  JYN:[id number] DOB: April 30, 1950 DOA: 06/30/2020 PCP: Jearld Fenton, NP  128A/128A-AA   Assessment & Plan:   Principal Problem:   Acute actinic otitis externa, left ear Active Problems:   GERD (gastroesophageal reflux disease)   Facial cellulitis   Hyperlipidemia   Abigail Delacruz is a 70 y.o. female with medical history significant of hyperlipidemia, GERD, cataracts, who presents with left ear pain, left facial swelling.  Patient states that she was recently in [place], returned home about a week ago, subsequently developed pain left ear. Her left ear has been progressively swelling.  Patient has a little clear drainage from left ear.  No fever or chills.  She also developed progressively worsening left facial pain and swelling.This is associated with weakenss of the left side of the face.  No vision change.  No unilateral numbness or tingling in extremities.  Patient was seen by PCP twice and treated with Flonase and Ciprodex, IM Rocephin and Decadron, but symptoms has been worsening progressively.    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   --Discussed with oncall ID Dr. Baxter Flattery who agreed with the dx.  Started on treatment for Shingles  PLAN:  --cont Valtrex 1g TID --continue prednisone 40 mg BID --cont Percocet PRN for pain --continue gabapentin 300 mg TID --IV toradol PRN for pain --ID to see pt on Monday  # Ruled out otitis externa  # Ruled out left facial cellulitis --no fever, no leukocytosis, procal neg.  Does not appear to have superimposed bacterial infection. --abx d/c'ed, per ID rec --monitor off of abx  GERD --cont PPI  HLD --cont statin   DVT prophylaxis: Lovenox SQ Code Status: Full code  Family  Communication: daughters updated at bedside today Level of care: Med-Surg Dispo:   The patient is from: home Anticipated d/c is to: home Anticipated d/c date is: tomorrow Patient currently is not medically ready to d/c due to: ID to see pt before discharge.   Subjective and Interval History:  Pt reported burning pain in her ear controlled by current pain regimen, but pain returned 2-3 hours after taking pain meds.  Normal oral intake.  Still can't close her left eye completely.  Family also mentioned pt felt dizzy with standing.   Objective: Vitals:   07/02/20 1203 07/02/20 1600 07/02/20 2010 07/03/20 0037  BP: (!) 153/67 111/72 (!) 150/78 (!) 143/72  Pulse: 60 73 (!) 59 62  Resp: 18 18 16 18   Temp: 98.4 F (36.9 C) 98.7 F (37.1 C) 98 F (36.7 C) 98.2 F (36.8 C)  TempSrc: Oral Oral Oral Oral  SpO2: 95% 94% 94%   Weight:      Height:        Intake/Output Summary (Last 24 hours) at 07/03/2020 0201 Last data filed at 07/02/2020 2010 Gross per 24 hour  Intake 240 ml  Output --  Net 240 ml   Filed Weights   06/30/20 1042  Weight: 83.5 kg    Examination:  Constitutional: NAD, AAOx3 HEENT: conjunctivae and lids normal, EOMI.  Outer ear appeared less edematous and less erythematous, see photos below. CV: No cyanosis.   RESP: normal respiratory effort, on RA Extremities: No effusions, edema in BLE SKIN: warm, dry, small vesicles remained around neck. Neuro: left facial droop,  left eye unable to close completely  Psych: Normal mood and affect.  Appropriate judgement and reason    07/02/20    06/30/20     07/01/20      Data Reviewed: I have personally reviewed following labs and imaging studies  CBC: Recent Labs  Lab 06/30/20 1054 07/01/20 0405 07/02/20 0459  WBC 7.9 9.9 10.1  NEUTROABS 5.8  --   --   HGB 14.4 14.2 14.4  HCT 41.0 41.6 40.5  MCV 89.7 92.9 91.2  PLT 240 242 810   Basic Metabolic Panel: Recent Labs  Lab 06/30/20 1054 07/01/20 0405  07/02/20 0459  NA 137 136 137  K 3.8 4.5 5.0  CL 102 101 102  CO2 27 25 27   GLUCOSE 105* 153* 140*  BUN 19 23 27*  CREATININE 0.64 0.77 0.65  CALCIUM 9.2 9.2 9.2  MG  --   --  2.3   GFR: Estimated Creatinine Clearance: 65.6 mL/min (by C-G formula based on SCr of 0.65 mg/dL). Liver Function Tests: No results for input(s): AST, ALT, ALKPHOS, BILITOT, PROT, ALBUMIN in the last 168 hours. No results for input(s): LIPASE, AMYLASE in the last 168 hours. No results for input(s): AMMONIA in the last 168 hours. Coagulation Profile: No results for input(s): INR, PROTIME in the last 168 hours. Cardiac Enzymes: No results for input(s): CKTOTAL, CKMB, CKMBINDEX, TROPONINI in the last 168 hours. BNP (last 3 results) No results for input(s): PROBNP in the last 8760 hours. HbA1C: No results for input(s): HGBA1C in the last 72 hours. CBG: No results for input(s): GLUCAP in the last 168 hours. Lipid Profile: No results for input(s): CHOL, HDL, LDLCALC, TRIG, CHOLHDL, LDLDIRECT in the last 72 hours. Thyroid Function Tests: No results for input(s): TSH, T4TOTAL, FREET4, T3FREE, THYROIDAB in the last 72 hours. Anemia Panel: No results for input(s): VITAMINB12, FOLATE, FERRITIN, TIBC, IRON, RETICCTPCT in the last 72 hours. Sepsis Labs: Recent Labs  Lab 07/02/20 [place] <0.10    Recent Results (from the past 240 hour(s))  Culture, blood (routine x 2)     Status: None (Preliminary result)   Collection Time: 06/30/20 12:38 PM   Specimen: BLOOD  Result Value Ref Range Status   Specimen Description BLOOD LEFT ANTECUBITAL  Final   Special Requests   Final    BOTTLES DRAWN AEROBIC AND ANAEROBIC Blood Culture results may not be optimal due to an inadequate volume of blood received in culture bottles   Culture   Final    NO GROWTH 2 DAYS Performed at [facility], [address] [place], [place] [address]    Report Status PENDING  Incomplete  Resp Panel by RT-PCR (Flu A&B,  Covid) Nasopharyngeal Swab     Status: None   Collection Time: 06/30/20 12:38 PM   Specimen: Nasopharyngeal Swab; Nasopharyngeal(NP) swabs in vial transport medium  Result Value Ref Range Status   SARS Coronavirus 2 by RT PCR NEGATIVE NEGATIVE Final    Comment: (NOTE) SARS-CoV-2 target nucleic acids are NOT DETECTED.  The SARS-CoV-2 RNA is generally detectable in upper respiratory specimens during the acute phase of infection. The lowest concentration of SARS-CoV-2 viral copies this assay can detect is 138 copies/mL. A negative result does not preclude SARS-Cov-2 infection and should not be used as the sole basis for treatment or other patient management decisions. A negative result may occur with  improper specimen collection/handling, submission of specimen other than nasopharyngeal swab, presence of viral mutation(s) within the areas targeted by  this assay, and inadequate number of viral copies(<138 copies/mL). A negative result must be combined with clinical observations, patient history, and epidemiological information. The expected result is Negative.  Fact Sheet for Patients:  EntrepreneurPulse.com.au  Fact Sheet for Healthcare Providers:  IncredibleEmployment.be  This test is no t yet approved or cleared by the [place] FDA and  has been authorized for detection and/or diagnosis of SARS-CoV-2 by FDA under an Emergency Use Authorization (EUA). This EUA will remain  in effect (meaning this test can be used) for the duration of the COVID-19 declaration under Section 564(b)(1) of the Act, 21 U.S.C.section 360bbb-3(b)(1), unless the authorization is terminated  or revoked sooner.       Influenza A by PCR NEGATIVE NEGATIVE Final   Influenza B by PCR NEGATIVE NEGATIVE Final    Comment: (NOTE) The Xpert Xpress SARS-CoV-2/FLU/RSV plus assay is intended as an aid in the diagnosis of influenza from Nasopharyngeal swab specimens and should  not be used as a sole basis for treatment. Nasal washings and aspirates are unacceptable for Xpert Xpress SARS-CoV-2/FLU/RSV testing.  Fact Sheet for Patients: EntrepreneurPulse.com.au  Fact Sheet for Healthcare Providers: IncredibleEmployment.be  This test is not yet approved or cleared by the [place] FDA and has been authorized for detection and/or diagnosis of SARS-CoV-2 by FDA under an Emergency Use Authorization (EUA). This EUA will remain in effect (meaning this test can be used) for the duration of the COVID-19 declaration under Section 564(b)(1) of the Act, 21 U.S.C. section 360bbb-3(b)(1), unless the authorization is terminated or revoked.  Performed at [facility], [address] [place], [place] [address]   Culture, blood (Routine X 2) w Reflex to ID Panel     Status: None (Preliminary result)   Collection Time: 07/01/20  7:59 AM   Specimen: BLOOD  Result Value Ref Range Status   Specimen Description BLOOD  RIGHT ARM  Final   Special Requests   Final    BOTTLES DRAWN AEROBIC AND ANAEROBIC Blood Culture adequate volume   Culture   Final    NO GROWTH < 24 HOURS Performed at [facility], [address], [place], [place] [address]    Report Status PENDING  Incomplete      Radiology Studies: No results found.   Scheduled Meds: . atorvastatin  20 mg Oral QHS  . enoxaparin (LOVENOX) injection  0.5 mg/kg Subcutaneous Q24H  . gabapentin  300 mg Oral TID  . multivitamin with minerals  1 tablet Oral Daily  . predniSONE  40 mg Oral BID WC  . valACYclovir  1,000 mg Oral TID   Continuous Infusions:   LOS: 3 days     Enzo Bi, MD [facility] If 7PM-7AM, please contact night-coverage 07/03/2020, 2:01 AM

## 2020-07-03 LAB — BASIC METABOLIC PANEL
Anion gap: 6 (ref 5–15)
BUN: 29 mg/dL — ABNORMAL HIGH (ref 8–23)
CO2: 28 mmol/L (ref 22–32)
Calcium: 9 mg/dL (ref 8.9–10.3)
Chloride: 102 mmol/L (ref 98–111)
Creatinine, Ser: 0.73 mg/dL (ref 0.44–1.00)
GFR, Estimated: >60 mL/min (ref >60)
Glucose, Bld: 137 mg/dL — ABNORMAL HIGH (ref 70–99)
Potassium: 4.4 mmol/L (ref 3.5–5.1)
Sodium: 136 mmol/L (ref 135–145)

## 2020-07-03 LAB — CBC
HCT: 41.9 % (ref 36.0–46.0)
Hemoglobin: 14 g/dL (ref 12.0–15.0)
MCH: 30.9 pg (ref 26.0–34.0)
MCHC: 33.4 g/dL (ref 30.0–36.0)
MCV: 92.5 fL (ref 80.0–100.0)
Platelets: 264 10*3/uL (ref 150–400)
RBC: 4.53 MIL/uL (ref 3.87–5.11)
RDW: 12.2 % (ref 11.5–15.5)
WBC: 10.2 10*3/uL (ref 4.0–10.5)
nRBC: 0 % (ref 0.0–0.2)

## 2020-07-03 LAB — MAGNESIUM: Magnesium: 2.3 mg/dL (ref 1.7–2.4)

## 2020-07-03 MED ORDER — ARTIFICIAL TEARS OPHTHALMIC OINT
TOPICAL_OINTMENT | Freq: Every day | OPHTHALMIC | Status: DC
  Filled 2020-07-03 (×2): qty 3.5

## 2020-07-03 MED ORDER — POLYVINYL ALCOHOL 1.4 % OP SOLN
1.0000 [drp] | Freq: Two times a day (BID) | OPHTHALMIC | Status: DC
  Administered 2020-07-03 – 2020-07-04 (×2): 1 [drp] via OPHTHALMIC
  Filled 2020-07-03: qty 15

## 2020-07-03 NOTE — Progress Notes (Signed)
PROGRESS NOTE    [name]  RCV:[id number] DOB: 1950/04/16 DOA: 06/30/2020 PCP: Abigail Fenton, NP  128A/128A-AA  Brief narrative.Abigail Delacruz is a 70 y.o. female with medical history significant of hyperlipidemia, GERD, cataracts, who presents with left ear pain, left facial swelling.  Patient states that she was recently in [place], returned home about a week ago, subsequently developed pain left ear. Her left ear has been progressively swelling.  Patient has a little clear drainage from left ear.  No fever or chills.  She also developed progressively worsening left facial pain and swelling.This is associated with weakenss of the left side of the face.  No vision change.  No unilateral numbness or tingling in extremities.  Patient was seen by PCP twice and treated with Flonase and Ciprodex, IM Rocephin and Decadron, but symptoms has been worsening progressively. Admitted for Ramsay Hunt syndrome, started on Valtrex.  Subjective. Patient continues to have left ear pain radiating all the way down to left side of her neck.  Daughter at bedside.  She was having left facial droop, unable to close left eye.  Assessment & Plan:   Principal Problem:   Acute actinic otitis externa, left ear Active Problems:   GERD (gastroesophageal reflux disease)   Facial cellulitis   Hyperlipidemia     # Herpes Zoster oticus (aka Ramsay Hunt syndrome) --pt presented with erythema of left outer ear, severe burning pain inside left inner ear, left facial nerve palsy, dizziness with standing, and developed "dew drop" vesicles shortly after presentation.   --Discussed with oncall ID Dr. Baxter Flattery who agreed with the dx.  Started on treatment for Shingles  PLAN:  --cont Valtrex 1g TID --continue prednisone 40 mg BID --cont Percocet PRN for pain --continue gabapentin 300 mg TID --IV toradol PRN for pain --ID to see pt today.  # Ruled out otitis externa  # Ruled out left facial cellulitis --no  fever, no leukocytosis, procal neg.  Does not appear to have superimposed bacterial infection. --abx d/c'ed, per ID rec --monitor off of abx  Left-sided [name].  Most likely with Ramsay Hunt syndrome.  Unable to close left eye.  MRI brain was without any acute abnormality of brain. -Teardrops -Moisturizing ointment for night. -Eyepatch while sleeping. -PT evaluation.  GERD --cont PPI  HLD --cont statin   DVT prophylaxis: Lovenox SQ Code Status: Full code  Family Communication: daughters updated at bedside. Level of care: Med-Surg Dispo:   The patient is from: home Anticipated d/c is to: home Anticipated d/c date is: tomorrow Patient currently is not medically ready to d/c due to: ID to see pt before discharge.   Objective: Vitals:   07/02/20 2010 07/03/20 0037 07/03/20 0303 07/03/20 0837  BP: (!) 150/78 (!) 143/72 121/73 (!) 151/75  Pulse: (!) 59 62 60 61  Resp: 16 18 14 17   Temp: 98 F (36.7 C) 98.2 F (36.8 C) 98.1 F (36.7 C) 98.4 F (36.9 C)  TempSrc: Oral Oral Oral Oral  SpO2: 94%  95% 95%  Weight:      Height:        Intake/Output Summary (Last 24 hours) at 07/03/2020 1523 Last data filed at 07/02/2020 2010 Gross per 24 hour  Intake 240 ml  Output --  Net 240 ml   Filed Weights   06/30/20 1042  Weight: 83.5 kg    General.  Well-developed lady, in no acute distress. Pulmonary.  Lungs clear bilaterally, normal respiratory effort. CV.  Regular rate and rhythm,  no JVD, rub or murmur. Abdomen.  Soft, nontender, nondistended, BS positive. CNS.  Alert and oriented x3.  Left facial drop, unable to close left eyelid. Extremities.  No edema, no cyanosis, pulses intact and symmetrical. Psychiatry.  Judgment and insight appears normal.   07/02/20    06/30/20     07/01/20      Data Reviewed: I have personally reviewed following labs and imaging studies  CBC: Recent Labs  Lab 06/30/20 1054 07/01/20 0405 07/02/20 0459 07/03/20 0309  WBC 7.9  9.9 10.1 10.2  NEUTROABS 5.8  --   --   --   HGB 14.4 14.2 14.4 14.0  HCT 41.0 41.6 40.5 41.9  MCV 89.7 92.9 91.2 92.5  PLT 240 242 248 413   Basic Metabolic Panel: Recent Labs  Lab 06/30/20 1054 07/01/20 0405 07/02/20 0459 07/03/20 0309  NA 137 136 137 136  K 3.8 4.5 5.0 4.4  CL 102 101 102 102  CO2 27 25 27 28   GLUCOSE 105* 153* 140* 137*  BUN 19 23 27* 29*  CREATININE 0.64 0.77 0.65 0.73  CALCIUM 9.2 9.2 9.2 9.0  MG  --   --  2.3 2.3   GFR: Estimated Creatinine Clearance: 65.6 mL/min (by C-G formula based on SCr of 0.73 mg/dL). Liver Function Tests: No results for input(s): AST, ALT, ALKPHOS, BILITOT, PROT, ALBUMIN in the last 168 hours. No results for input(s): LIPASE, AMYLASE in the last 168 hours. No results for input(s): AMMONIA in the last 168 hours. Coagulation Profile: No results for input(s): INR, PROTIME in the last 168 hours. Cardiac Enzymes: No results for input(s): CKTOTAL, CKMB, CKMBINDEX, TROPONINI in the last 168 hours. BNP (last 3 results) No results for input(s): PROBNP in the last 8760 hours. HbA1C: No results for input(s): HGBA1C in the last 72 hours. CBG: No results for input(s): GLUCAP in the last 168 hours. Lipid Profile: No results for input(s): CHOL, HDL, LDLCALC, TRIG, CHOLHDL, LDLDIRECT in the last 72 hours. Thyroid Function Tests: No results for input(s): TSH, T4TOTAL, FREET4, T3FREE, THYROIDAB in the last 72 hours. Anemia Panel: No results for input(s): VITAMINB12, FOLATE, FERRITIN, TIBC, IRON, RETICCTPCT in the last 72 hours. Sepsis Labs: Recent Labs  Lab 07/02/20 [place] <0.10    Recent Results (from the past 240 hour(s))  Culture, blood (routine x 2)     Status: None (Preliminary result)   Collection Time: 06/30/20 12:38 PM   Specimen: BLOOD  Result Value Ref Range Status   Specimen Description BLOOD LEFT ANTECUBITAL  Final   Special Requests   Final    BOTTLES DRAWN AEROBIC AND ANAEROBIC Blood Culture results may  not be optimal due to an inadequate volume of blood received in culture bottles   Culture   Final    NO GROWTH 3 DAYS Performed at [facility], [address], [place], [place] [address]    Report Status PENDING  Incomplete  Resp Panel by RT-PCR (Flu A&B, Covid) Nasopharyngeal Swab     Status: None   Collection Time: 06/30/20 12:38 PM   Specimen: Nasopharyngeal Swab; Nasopharyngeal(NP) swabs in vial transport medium  Result Value Ref Range Status   SARS Coronavirus 2 by RT PCR NEGATIVE NEGATIVE Final    Comment: (NOTE) SARS-CoV-2 target nucleic acids are NOT DETECTED.  The SARS-CoV-2 RNA is generally detectable in upper respiratory specimens during the acute phase of infection. The lowest concentration of SARS-CoV-2 viral copies this assay can detect is 138 copies/mL. A negative result  does not preclude SARS-Cov-2 infection and should not be used as the sole basis for treatment or other patient management decisions. A negative result may occur with  improper specimen collection/handling, submission of specimen other than nasopharyngeal swab, presence of viral mutation(s) within the areas targeted by this assay, and inadequate number of viral copies(<138 copies/mL). A negative result must be combined with clinical observations, patient history, and epidemiological information. The expected result is Negative.  Fact Sheet for Patients:  EntrepreneurPulse.com.au  Fact Sheet for Healthcare Providers:  IncredibleEmployment.be  This test is no t yet approved or cleared by the [place] FDA and  has been authorized for detection and/or diagnosis of SARS-CoV-2 by FDA under an Emergency Use Authorization (EUA). This EUA will remain  in effect (meaning this test can be used) for the duration of the COVID-19 declaration under Section 564(b)(1) of the Act, 21 U.S.C.section 360bbb-3(b)(1), unless the authorization is terminated  or revoked  sooner.       Influenza A by PCR NEGATIVE NEGATIVE Final   Influenza B by PCR NEGATIVE NEGATIVE Final    Comment: (NOTE) The Xpert Xpress SARS-CoV-2/FLU/RSV plus assay is intended as an aid in the diagnosis of influenza from Nasopharyngeal swab specimens and should not be used as a sole basis for treatment. Nasal washings and aspirates are unacceptable for Xpert Xpress SARS-CoV-2/FLU/RSV testing.  Fact Sheet for Patients: EntrepreneurPulse.com.au  Fact Sheet for Healthcare Providers: IncredibleEmployment.be  This test is not yet approved or cleared by the [place] FDA and has been authorized for detection and/or diagnosis of SARS-CoV-2 by FDA under an Emergency Use Authorization (EUA). This EUA will remain in effect (meaning this test can be used) for the duration of the COVID-19 declaration under Section 564(b)(1) of the Act, 21 U.S.C. section 360bbb-3(b)(1), unless the authorization is terminated or revoked.  Performed at [facility], [address] [place], [place] [address]   Culture, blood (Routine X 2) w Reflex to ID Panel     Status: None (Preliminary result)   Collection Time: 07/01/20  7:59 AM   Specimen: BLOOD  Result Value Ref Range Status   Specimen Description BLOOD  RIGHT ARM  Final   Special Requests   Final    BOTTLES DRAWN AEROBIC AND ANAEROBIC Blood Culture adequate volume   Culture   Final    NO GROWTH 2 DAYS Performed at [facility], [address] [place],  [address]    Report Status PENDING  Incomplete      Radiology Studies: No results found.   Scheduled Meds: . atorvastatin  20 mg Oral QHS  . enoxaparin (LOVENOX) injection  0.5 mg/kg Subcutaneous Q24H  . gabapentin  300 mg Oral TID  . multivitamin with minerals  1 tablet Oral Daily  . predniSONE  40 mg Oral BID WC  . valACYclovir  1,000 mg Oral TID   Continuous Infusions:   LOS: 3 days     Lorella Nimrod,  MD [facility] If 7PM-7AM, please contact night-coverage 07/03/2020, 3:23 PM

## 2020-07-03 NOTE — Consult Note (Signed)
Infectious Disease     Reason for Consult [place]    Referring Physician: Dr Reesa Chew Date of Admission:  06/30/2020   Principal Problem:   Acute actinic otitis externa, left ear Active Problems:   GERD (gastroesophageal reflux disease)   Facial cellulitis   Hyperlipidemia   HPI: Abigail Delacruz is a 70 y.o. female admitted on May 6 with complaints of left ear pain and left facial swelling.  She has a history of hyperlipidemia, GERD, cataracts.  She had returned home from a trip to [place] recently and then developed left ear pain with swelling and drainage from the left ear.  She also developed left facial weakness.  She was seen by her PCP and started on nasal and Ciprodex and as well as given a shot of Decadron and IM Rocephin.  Came more severe her for.  On admission white count was 7.9.  COVID was negative.  She was afebrile.  CT of the head and her maxillofacial showed acute otitis externa.  She was empirically started on vancomycin and cefepime.  She then developed vesicles on her ear and was diagnosed with Ramsay Hunt syndrome.  She was started on Valtrex and prednisone as well as gabapentin Percocet for pain.  She is remained afebrile.  She has had serological testing including negative test for HIV, positive for VZV herpes 1 and 2.  No PCR was done yet.  Sed rate was normal at 22.  Blood cultures negative x2.  She had an MRI of her brain that showed normal enhancement of the left ear and external auditory canal as well as  of the left facial nerve Currently she continues to have pain in ear and head. No fevers, chills. No confusion of focal weakness besides face   Past Medical History:  Diagnosis Date  . Cataract   . Hyperlipidemia    Past Surgical History:  Procedure Laterality Date  . APPENDECTOMY  1971  . CATARACT EXTRACTION W/PHACO Right 09/08/2017   Procedure: CATARACT EXTRACTION PHACO AND INTRAOCULAR LENS PLACEMENT ([place])  RIGHT;  Surgeon: Eulogio Bear, MD;  Location: [facility];  Service: Ophthalmology;  Laterality: Right;  . CATARACT EXTRACTION W/PHACO Left 09/29/2017   Procedure: CATARACT EXTRACTION PHACO AND INTRAOCULAR LENS PLACEMENT ([place]) LEFT;  Surgeon: Eulogio Bear, MD;  Location: [place];  Service: Ophthalmology;  Laterality: Left;  . COLONOSCOPY WITH PROPOFOL N/A 10/20/2017   Procedure: COLONOSCOPY WITH PROPOFOL;  Surgeon: Lucilla Lame, MD;  Location: [facility];  Service: Endoscopy;  Laterality: N/A;   Social History   Tobacco Use  . Smoking status: Former Smoker    Quit date: 1974    Years since quitting: 48.3  . Smokeless tobacco: Never Used  Vaping Use  . Vaping Use: Never used  Substance Use Topics  . Alcohol use: Yes    Comment: social 1x/yr  . Drug use: Never   Family History  Problem Relation Age of Onset  . Kidney failure Mother   . Cirrhosis Father   . Alcohol abuse Father   . Diabetes Brother   . Hypertension Brother   . Arthritis Brother   . Breast cancer Neg Hx     Allergies:  Allergies  Allergen Reactions  . Ciprodex [Ciprofloxacin-Dexamethasone]     Pt states that this ear drop she feels caused her swelling.   Asencion Islam [Fluticasone Propionate]     Current antibiotics: Antibiotics Given (last 72 hours)    Date/Time Action Medication Dose Rate  06/30/20 1434 New Bag/Given   vancomycin (VANCOREADY) IVPB 1500 mg/300 mL 1,500 mg 150 mL/hr   06/30/20 1828 New Bag/Given   ceFEPIme (MAXIPIME) 2 g in sodium chloride 0.9 % 100 mL IVPB 2 g 200 mL/hr   07/01/20 0609 New Bag/Given   ceFEPIme (MAXIPIME) 2 g in sodium chloride 0.9 % 100 mL IVPB 2 g 200 mL/hr   07/01/20 7622 New Bag/Given   vancomycin (VANCOCIN) IVPB 1000 mg/200 mL premix 1,000 mg 200 mL/hr   07/01/20 1445 Given   valACYclovir (VALTREX) tablet 1,000 mg 1,000 mg    07/01/20 2007 Given   valACYclovir (VALTREX) tablet 1,000 mg 1,000 mg    07/02/20 0847 Given   valACYclovir (VALTREX) tablet 1,000 mg 1,000 mg    07/02/20 1616  Given   valACYclovir (VALTREX) tablet 1,000 mg 1,000 mg    07/02/20 2223 Given   valACYclovir (VALTREX) tablet 1,000 mg 1,000 mg    07/03/20 1012 Given   valACYclovir (VALTREX) tablet 1,000 mg 1,000 mg       MEDICATIONS: . atorvastatin  20 mg Oral QHS  . enoxaparin (LOVENOX) injection  0.5 mg/kg Subcutaneous Q24H  . gabapentin  300 mg Oral TID  . multivitamin with minerals  1 tablet Oral Daily  . predniSONE  40 mg Oral BID WC  . valACYclovir  1,000 mg Oral TID    Review of Systems - 11 systems reviewed and negative per HPI   OBJECTIVE: Temp:  [98 F (36.7 C)-98.7 F (37.1 C)] 98.4 F (36.9 C) (05/09 0837) Pulse Rate:  [59-73] 61 (05/09 0837) Resp:  [14-18] 17 (05/09 0837) BP: (111-151)/(72-78) 151/75 (05/09 0837) SpO2:  [94 %-95 %] 95 % (05/09 0837) Physical Exam  Constitutional:  oriented to person, place, and time. appears well-developed and well-nourished. No distress.  HENT:Dense L 7th nerve palsy L ear with extensive vesicules and crusting Mouth/Throat: Oropharynx is clear and moist. No oropharyngeal exudate.  Cardiovascular: Normal rate, regular rhythm and normal heart sounds. Exam reveals no gallop and no friction rub.  No murmur heard.  Pulmonary/Chest: Effort normal and breath sounds normal. No respiratory distress.  has no wheezes.  Neck = supple, no nuchal rigidity Abdominal: Soft. Bowel sounds are normal.  exhibits no distension. There is no tenderness.  Lymphadenopathy: no cervical adenopathy. No axillary adenopathy Neurological: alert and oriented to person, place, and time.  Skin: cursting and vesicles L ear. Few vesicese on L neck  Psychiatric: a normal mood and affect.  behavior is normal.        LABS: Results for orders placed or performed during the hospital encounter of 06/30/20 (from the past 48 hour(s))  Basic metabolic panel     Status: Abnormal   Collection Time: 07/02/20  4:59 AM  Result Value Ref Range   Sodium 137 135 - 145 mmol/L    Potassium 5.0 3.5 - 5.1 mmol/L    Comment: HEMOLYSIS AT THIS LEVEL MAY AFFECT RESULT   Chloride 102 98 - 111 mmol/L   CO2 27 22 - 32 mmol/L   Glucose, Bld 140 (H) 70 - 99 mg/dL    Comment: Glucose reference range applies only to samples taken after fasting for at least 8 hours.   BUN 27 (H) 8 - 23 mg/dL   Creatinine, Ser 0.65 0.44 - 1.00 mg/dL   Calcium 9.2 8.9 - 10.3 mg/dL   GFR, Estimated >60 >60 mL/min    Comment: (NOTE) Calculated using the CKD-EPI Creatinine Equation (2021)    Anion gap 8  5 - 15    Comment: Performed at [facility], [address] [place], [place] [address]  CBC     Status: None   Collection Time: 07/02/20  4:59 AM  Result Value Ref Range   WBC 10.1 4.0 - 10.5 K/uL   RBC 4.44 3.87 - 5.11 MIL/uL   Hemoglobin 14.4 12.0 - 15.0 g/dL   HCT 40.5 36.0 - 46.0 %   MCV 91.2 80.0 - 100.0 fL   MCH 32.4 26.0 - 34.0 pg   MCHC 35.6 30.0 - 36.0 g/dL   RDW 12.0 11.5 - 15.5 %   Platelets 248 150 - 400 K/uL   nRBC 0.0 0.0 - 0.2 %    Comment: Performed at [facility], [address] [place], [place] [address]  Magnesium     Status: None   Collection Time: 07/02/20  4:59 AM  Result Value Ref Range   Magnesium 2.3 1.7 - 2.4 mg/dL    Comment: Performed at [facility], [address] [place], [place] [address]  Procalcitonin - Baseline     Status: None   Collection Time: 07/02/20  4:59 AM  Result Value Ref Range   Procalcitonin <0.10 ng/mL    Comment:        Interpretation: PCT (Procalcitonin) <= 0.5 ng/mL: Systemic infection (sepsis) is not likely. Local bacterial infection is possible. (NOTE)       Sepsis PCT Algorithm           Lower Respiratory Tract                                      Infection PCT Algorithm    ----------------------------     ----------------------------         PCT < 0.25 ng/mL                PCT < 0.10 ng/mL          Strongly encourage             Strongly discourage   discontinuation of antibiotics     initiation of antibiotics    ----------------------------     -----------------------------       PCT 0.25 - 0.50 ng/mL            PCT 0.10 - 0.25 ng/mL               OR       >80% decrease in PCT            Discourage initiation of                                            antibiotics      Encourage discontinuation           of antibiotics    ----------------------------     -----------------------------         PCT >= 0.50 ng/mL              PCT 0.26 - 0.50 ng/mL               AND        <80% decrease in PCT             Encourage initiation of  antibiotics       Encourage continuation           of antibiotics    ----------------------------     -----------------------------        PCT >= 0.50 ng/mL                  PCT > 0.50 ng/mL               AND         increase in PCT                  Strongly encourage                                      initiation of antibiotics    Strongly encourage escalation           of antibiotics                                     -----------------------------                                           PCT <= 0.25 ng/mL                                                 OR                                        > 80% decrease in PCT                                      Discontinue / Do not initiate                                             antibiotics  Performed at [facility], [address], [place], [place] [address]   Basic metabolic panel     Status: Abnormal   Collection Time: 07/03/20  3:09 AM  Result Value Ref Range   Sodium 136 135 - 145 mmol/L   Potassium 4.4 3.5 - 5.1 mmol/L   Chloride 102 98 - 111 mmol/L   CO2 28 22 - 32 mmol/L   Glucose, Bld 137 (H) 70 - 99 mg/dL    Comment: Glucose reference range applies only to samples taken after fasting for at least 8 hours.   BUN 29 (H) 8 - 23 mg/dL   Creatinine, Ser 0.73 0.44 - 1.00 mg/dL   Calcium 9.0 8.9 - 10.3 mg/dL   GFR,  Estimated >60 >60 mL/min    Comment: (NOTE) Calculated using the CKD-EPI Creatinine Equation (2021)    Anion gap 6 5 - 15    Comment: Performed at [facility], [address] [place], [place] [address]  CBC  Status: None   Collection Time: 07/03/20  3:09 AM  Result Value Ref Range   WBC 10.2 4.0 - 10.5 K/uL   RBC 4.53 3.87 - 5.11 MIL/uL   Hemoglobin 14.0 12.0 - 15.0 g/dL   HCT 41.9 36.0 - 46.0 %   MCV 92.5 80.0 - 100.0 fL   MCH 30.9 26.0 - 34.0 pg   MCHC 33.4 30.0 - 36.0 g/dL   RDW 12.2 11.5 - 15.5 %   Platelets 264 150 - 400 K/uL   nRBC 0.0 0.0 - 0.2 %    Comment: Performed at [facility], [address] [place], [place] [address]  Magnesium     Status: None   Collection Time: 07/03/20  3:09 AM  Result Value Ref Range   Magnesium 2.3 1.7 - 2.4 mg/dL    Comment: Performed at [facility], [address] [place], [place] [address]   No components found for: ESR, C REACTIVE PROTEIN MICRO: Recent Results (from the past 720 hour(s))  Culture, blood (routine x 2)     Status: None (Preliminary result)   Collection Time: 06/30/20 12:38 PM   Specimen: BLOOD  Result Value Ref Range Status   Specimen Description BLOOD LEFT ANTECUBITAL  Final   Special Requests   Final    BOTTLES DRAWN AEROBIC AND ANAEROBIC Blood Culture results may not be optimal due to an inadequate volume of blood received in culture bottles   Culture   Final    NO GROWTH 3 DAYS Performed at [facility], [address], [place], [place] [address]    Report Status PENDING  Incomplete  Resp Panel by RT-PCR (Flu A&B, Covid) Nasopharyngeal Swab     Status: None   Collection Time: 06/30/20 12:38 PM   Specimen: Nasopharyngeal Swab; Nasopharyngeal(NP) swabs in vial transport medium  Result Value Ref Range Status   SARS Coronavirus 2 by RT PCR NEGATIVE NEGATIVE Final    Comment: (NOTE) SARS-CoV-2 target nucleic acids are NOT DETECTED.  The SARS-CoV-2 RNA is generally  detectable in upper respiratory specimens during the acute phase of infection. The lowest concentration of SARS-CoV-2 viral copies this assay can detect is 138 copies/mL. A negative result does not preclude SARS-Cov-2 infection and should not be used as the sole basis for treatment or other patient management decisions. A negative result may occur with  improper specimen collection/handling, submission of specimen other than nasopharyngeal swab, presence of viral mutation(s) within the areas targeted by this assay, and inadequate number of viral copies(<138 copies/mL). A negative result must be combined with clinical observations, patient history, and epidemiological information. The expected result is Negative.  Fact Sheet for Patients:  EntrepreneurPulse.com.au  Fact Sheet for Healthcare Providers:  IncredibleEmployment.be  This test is no t yet approved or cleared by the [place] FDA and  has been authorized for detection and/or diagnosis of SARS-CoV-2 by FDA under an Emergency Use Authorization (EUA). This EUA will remain  in effect (meaning this test can be used) for the duration of the COVID-19 declaration under Section 564(b)(1) of the Act, 21 U.S.C.section 360bbb-3(b)(1), unless the authorization is terminated  or revoked sooner.       Influenza A by PCR NEGATIVE NEGATIVE Final   Influenza B by PCR NEGATIVE NEGATIVE Final    Comment: (NOTE) The Xpert Xpress SARS-CoV-2/FLU/RSV plus assay is intended as an aid in the diagnosis of influenza from Nasopharyngeal swab specimens and should not be used as a sole basis for treatment. Nasal washings  and aspirates are unacceptable for Xpert Xpress SARS-CoV-2/FLU/RSV testing.  Fact Sheet for Patients: EntrepreneurPulse.com.au  Fact Sheet for Healthcare Providers: IncredibleEmployment.be  This test is not yet approved or cleared by the [place] FDA  and has been authorized for detection and/or diagnosis of SARS-CoV-2 by FDA under an Emergency Use Authorization (EUA). This EUA will remain in effect (meaning this test can be used) for the duration of the COVID-19 declaration under Section 564(b)(1) of the Act, 21 U.S.C. section 360bbb-3(b)(1), unless the authorization is terminated or revoked.  Performed at [facility], [address] [place], [place] [address]   Culture, blood (Routine X 2) w Reflex to ID Panel     Status: None (Preliminary result)   Collection Time: 07/01/20  7:59 AM   Specimen: BLOOD  Result Value Ref Range Status   Specimen Description BLOOD  RIGHT ARM  Final   Special Requests   Final    BOTTLES DRAWN AEROBIC AND ANAEROBIC Blood Culture adequate volume   Culture   Final    NO GROWTH 2 DAYS Performed at [facility], [address], [place], [place] [address]    Report Status PENDING  Incomplete    IMAGING: CT Head W or Wo Contrast  Result Date: 06/30/2020 CLINICAL DATA:  Cellulitis. Left face and your infection. Left TMJ swelling. Swelling into the submandibular space. EXAM: CT HEAD WITHOUT AND WITH CONTRAST CT MAXILLOFACIAL WITHOUT AND WITH CONTRAST TECHNIQUE: Multidetector CT imaging of the head and maxillofacial structures were performed using the standard protocol without intravenous contrast. Multiplanar CT image reconstructions of the maxillofacial structures were also generated. COMPARISON:  None. FINDINGS: CT HEAD FINDINGS Brain: No acute infarct, hemorrhage, or mass lesion is present. The ventricles are of normal size. No significant white matter lesions are present. No significant extraaxial fluid collection is present. Postcontrast images demonstrate no pathologic enhancement. The brainstem and cerebellum are within normal limits. Vascular: No hyperdense vessel or unexpected calcification. Skull: Calvarium is intact. No focal lytic or blastic lesions are present. Soft tissue  inflammatory changes surround the left ear including the pinna. There is thickening of the skin in the external auditory canal. No fluid collection is present. CT MAXILLOFACIAL FINDINGS Osseous: No fracture or mandibular dislocation. No destructive process. Orbits: Bilateral lens replacements are present. Globes and orbits are otherwise within normal limits. Sinuses: The paranasal sinuses and mastoid air cells are clear. Soft tissues: Skin thickening is present about the left ear. No fluid collection or mass lesion is present. Submandibular gland scratched at there is slight thickening of the left platysma. Submandibular space demonstrates minimal inflammation. Small nodes are present. Subcutaneous fat demonstrates some inflammation over the posterior left face over the parotid gland. 6 mm posterior left parotid lymph node noted. IMPRESSION: 1. Soft tissue inflammatory changes about the left ear including the pinna compatible with acute otitis externa. 2. No fluid collection or mass lesion. 3. Normal CT appearance of the brain. 4. No acute or focal lesion to explain the patient's symptoms. Electronically Signed   By: San Morelle M.D.   On: 06/30/2020 15:29   MR BRAIN W WO CONTRAST  Result Date: 07/01/2020 CLINICAL DATA:  Left facial pain.  CN 7 neuropathy. EXAM: MRI HEAD WITHOUT AND WITH CONTRAST TECHNIQUE: Multiplanar, multiecho pulse sequences of the brain and surrounding structures were obtained without and with intravenous contrast. CONTRAST:  7.56m GADAVIST GADOBUTROL 1 MMOL/ML IV SOLN COMPARISON:  None. FINDINGS: Brain: No acute infarct, mass effect or extra-axial collection. No acute  or chronic hemorrhage. There is multifocal hyperintense T2-weighted signal within the white matter. Parenchymal volume and CSF spaces are normal. The midline structures are normal. There is mild contrast enhancement of the canalicular segment, geniculate ganglion and mastoid segment of the left facial nerve. Vascular:  Major flow voids are preserved. Skull and upper cervical spine: There is mild hyperintense T2-weighted signal and contrast enhancement of the soft tissues of the left ear and external auditory canal. Sinuses/Orbits:No paranasal sinus fluid levels or advanced mucosal thickening. No mastoid or middle ear effusion. Normal orbits. IMPRESSION: 1. Contrast enhancement of the left ear and external auditory canal soft tissues may indicate otitis externa. 2. Mild contrast enhancement of the canalicular segment, geniculate ganglion and mastoid segment of the left facial nerve. This is nonspecific and, while it may be seen in [name], the segments may also enhance in normal patients. 3. Findings of mild chronic microvascular ischemia. Electronically Signed   By: Ulyses Jarred M.D.   On: 07/01/2020 00:31   CT Maxillofacial W Contrast  Result Date: 06/30/2020 CLINICAL DATA:  Cellulitis. Left face and your infection. Left TMJ swelling. Swelling into the submandibular space. EXAM: CT HEAD WITHOUT AND WITH CONTRAST CT MAXILLOFACIAL WITHOUT AND WITH CONTRAST TECHNIQUE: Multidetector CT imaging of the head and maxillofacial structures were performed using the standard protocol without intravenous contrast. Multiplanar CT image reconstructions of the maxillofacial structures were also generated. COMPARISON:  None. FINDINGS: CT HEAD FINDINGS Brain: No acute infarct, hemorrhage, or mass lesion is present. The ventricles are of normal size. No significant white matter lesions are present. No significant extraaxial fluid collection is present. Postcontrast images demonstrate no pathologic enhancement. The brainstem and cerebellum are within normal limits. Vascular: No hyperdense vessel or unexpected calcification. Skull: Calvarium is intact. No focal lytic or blastic lesions are present. Soft tissue inflammatory changes surround the left ear including the pinna. There is thickening of the skin in the external auditory canal. No  fluid collection is present. CT MAXILLOFACIAL FINDINGS Osseous: No fracture or mandibular dislocation. No destructive process. Orbits: Bilateral lens replacements are present. Globes and orbits are otherwise within normal limits. Sinuses: The paranasal sinuses and mastoid air cells are clear. Soft tissues: Skin thickening is present about the left ear. No fluid collection or mass lesion is present. Submandibular gland scratched at there is slight thickening of the left platysma. Submandibular space demonstrates minimal inflammation. Small nodes are present. Subcutaneous fat demonstrates some inflammation over the posterior left face over the parotid gland. 6 mm posterior left parotid lymph node noted. IMPRESSION: 1. Soft tissue inflammatory changes about the left ear including the pinna compatible with acute otitis externa. 2. No fluid collection or mass lesion. 3. Normal CT appearance of the brain. 4. No acute or focal lesion to explain the patient's symptoms. Electronically Signed   By: San Morelle M.D.   On: 06/30/2020 15:29    Assessment:   Abigail Delacruz is a 70 y.o. female with Ramsey Hunt syndrome. MRI brain neg, no focal neuro findings, HA or confusion.  Discussed diagnosis and treatment with patient.   Recommendations Valtrex 1000 g tid x 10 days Pred 1 mg/kg daily for 5 total day Will need optho followup and eye protection with patch, ointment. Can dc home on oral vatrex once stable   Thank you very much for allowing me to participate in the care of this patient. Please call with questions.   Cheral Marker. Ola Spurr, MD

## 2020-07-03 NOTE — Plan of Care (Signed)
End of Shift Summary:  Alert and oriented x4. VSS. Remained on room air, sats >94%. Pain managed with prn medications. Denies n/v. Pt verbalized some dizziness when walking from bathroom. Encouraged pt to take time when changing position, encouraged use of call bell.  Problem: Education: Goal: Knowledge of General Education information will improve Description: Including pain rating scale, medication(s)/side effects and non-pharmacologic comfort measures Outcome: Progressing   Problem: Health Behavior/Discharge Planning: Goal: Ability to manage health-related needs will improve Outcome: Progressing   Problem: Pain Managment: Goal: General experience of comfort will improve Outcome: Progressing

## 2020-07-04 LAB — BASIC METABOLIC PANEL
Anion gap: 6 (ref 5–15)
BUN: 26 mg/dL — ABNORMAL HIGH (ref 8–23)
CO2: 30 mmol/L (ref 22–32)
Calcium: 9.1 mg/dL (ref 8.9–10.3)
Chloride: 101 mmol/L (ref 98–111)
Creatinine, Ser: 0.62 mg/dL (ref 0.44–1.00)
GFR, Estimated: >60 mL/min (ref >60)
Glucose, Bld: 141 mg/dL — ABNORMAL HIGH (ref 70–99)
Potassium: 5 mmol/L (ref 3.5–5.1)
Sodium: 137 mmol/L (ref 135–145)

## 2020-07-04 LAB — MISC LABCORP TEST (SEND OUT): Labcorp test code: 164226

## 2020-07-04 LAB — CBC
HCT: 40.9 % (ref 36.0–46.0)
Hemoglobin: 13.8 g/dL (ref 12.0–15.0)
MCH: 31.4 pg (ref 26.0–34.0)
MCHC: 33.7 g/dL (ref 30.0–36.0)
MCV: 93 fL (ref 80.0–100.0)
Platelets: 271 10*3/uL (ref 150–400)
RBC: 4.4 MIL/uL (ref 3.87–5.11)
RDW: 12.3 % (ref 11.5–15.5)
WBC: 10.7 10*3/uL — ABNORMAL HIGH (ref 4.0–10.5)
nRBC: 0 % (ref 0.0–0.2)

## 2020-07-04 LAB — CYCLIC CITRUL PEPTIDE ANTIBODY, IGG/IGA: CCP Antibodies IgG/IgA: 5 units (ref 0–19)

## 2020-07-04 LAB — MAGNESIUM: Magnesium: 2.4 mg/dL (ref 1.7–2.4)

## 2020-07-04 LAB — B. BURGDORFI ANTIBODIES

## 2020-07-04 MED ORDER — VALACYCLOVIR HCL 1 G PO TABS: 1000.0000 mg | ORAL_TABLET | Freq: Three times a day (TID) | ORAL | 0 refills | Status: AC

## 2020-07-04 MED ORDER — PREDNISONE 50 MG PO TABS: 50.0000 mg | ORAL_TABLET | Freq: Every day | ORAL | 0 refills | Status: AC

## 2020-07-04 MED ORDER — ARTIFICIAL TEARS OPHTHALMIC OINT: TOPICAL_OINTMENT | OPHTHALMIC | 1 refills | Status: DC | PRN

## 2020-07-04 MED ORDER — OXYCODONE-ACETAMINOPHEN 5-325 MG PO TABS: 1.0000 | ORAL_TABLET | ORAL | 0 refills | Status: DC | PRN

## 2020-07-04 MED ORDER — GABAPENTIN 300 MG PO CAPS: 300.0000 mg | ORAL_CAPSULE | Freq: Three times a day (TID) | ORAL | 0 refills | Status: DC

## 2020-07-04 MED ORDER — POLYVINYL ALCOHOL 1.4 % OP SOLN: 1.0000 [drp] | Freq: Two times a day (BID) | OPHTHALMIC | 0 refills | Status: DC

## 2020-07-04 MED ORDER — MAGNESIUM CITRATE PO SOLN
1.0000 | Freq: Once | ORAL | Status: DC
  Filled 2020-07-04: qty 296

## 2020-07-04 NOTE — Plan of Care (Signed)
End of Shift Summary:  Alert and oriented x4. VSS. Remained on room air, sats >94%. Pain managed with prn medications. Eye drops/ointment to both eyes and eye patch to left eye for comfort while sleeping. Still no BM, senna given prn and pt encouraged to increase water intake and ambulate in room with walker. Pt reports "I am much better than yesterday!" Denies n/v or dizziness. Remained free from falls or injury. Bed low and in locked position. Call bell within reach and able to use.   Problem: Education: Goal: Knowledge of General Education information will improve Description: Including pain rating scale, medication(s)/side effects and non-pharmacologic comfort measures 07/04/2020 0501 by Wende Crease, RN Outcome: Progressing 07/04/2020 0305 by Wende Crease, RN Outcome: Progressing   Problem: Health Behavior/Discharge Planning: Goal: Ability to manage health-related needs will improve 07/04/2020 0501 by Wende Crease, RN Outcome: Progressing 07/04/2020 0305 by Wende Crease, RN Outcome: Progressing   Problem: Elimination: Goal: Will not experience complications related to bowel motility 07/04/2020 0501 by Wende Crease, RN Outcome: Progressing 07/04/2020 0305 by Wende Crease, RN Outcome: Progressing   Problem: Pain Managment: Goal: General experience of comfort will improve 07/04/2020 0501 by Wende Crease, RN Outcome: Progressing 07/04/2020 0305 by Wende Crease, RN Outcome: Progressing   Problem: Safety: Goal: Ability to remain free from injury will improve 07/04/2020 0501 by Wende Crease, RN Outcome: Progressing 07/04/2020 0305 by Wende Crease, RN Outcome: Progressing

## 2020-07-04 NOTE — Discharge Summary (Signed)
Physician Discharge Summary  Abigail Delacruz [id number] DOB: [birth year] DOA: 06/30/2020  PCP: Jearld Fenton, NP  Admit date: 06/30/2020 Discharge date: 07/04/2020  Admitted From: Home Disposition: Home  Recommendations for Outpatient Follow-up:  1. Follow up with PCP in 1-2 weeks 2. Follow-up with ophthalmology 3. Please obtain BMP/CBC in one week 4. Please follow up on the following pending results: None  Home Health: No Equipment/Devices: None Discharge Condition: Stable CODE STATUS: Full Diet recommendation: Heart Healthy   Brief/Interim Summary: Abigail L Maltbyis a 70 y.o.femalewith medical history significant ofhyperlipidemia, GERD, cataracts, who presents with left ear pain, left facial swelling.  Patientstatesthat she was recently in [place], returned home about a week ago, subsequently developed pain left ear. Herleft ear has been progressively swelling. Patient has a little clear drainage from left ear.No fever or chills. She also developed progressively worsening left facial pain and swelling.This is associated with weakenss of the left side of the face.No vision change. No unilateral numbness or tingling inextremities.Patient was seen by PCP twiceand treated with Flonase and Ciprodex, IM Rocephin and Decadron,butsymptomshas been worsening progressively.  She also developed vesicular lesions involving outer canal Admitted for Ramsay Hunt syndrome, started on Valtrex and prednisone.  Pain was managed with gabapentin and as needed opioids.  Patient also developed left-sided [name] secondary to Ramsay Hunt syndrome.  Unable to close left eye for which she was given lubricants and eye patch and will follow up with her ophthalmologist for further recommendations. MRI brain was without any acute abnormality of brain  Infectious disease saw her and recommend total of 10 days of Valtrex and 5 days of prednisone.  Patient will continue rest of her home  medications and follow-up with her providers.  Discharge Diagnoses:  Principal Problem:   Acute actinic otitis externa, left ear Active Problems:   GERD (gastroesophageal reflux disease)   Facial cellulitis   Hyperlipidemia   Discharge Instructions  Discharge Instructions    Call MD for:  difficulty breathing, headache or visual disturbances   Complete by: As directed    Call MD for:  redness, tenderness, or signs of infection (pain, swelling, redness, odor or green/yellow discharge around incision site)   Complete by: As directed    Call MD for:  severe uncontrolled pain   Complete by: As directed    Diet - low sodium heart healthy   Complete by: As directed    Discharge instructions   Complete by: As directed    It was pleasure taking care of you. You are being given antiviral for 7 more days to complete a total of 10-day course. Continue taking prednisone for 5 more days. Continue using lubricating eyedrops and eye ointment and use eye patch while sleeping. Please follow-up with your ophthalmologist as soon as possible for further recommendations.   Increase activity slowly   Complete by: As directed      Allergies as of 07/04/2020      Reactions   Ciprodex [ciprofloxacin-dexamethasone]    Pt states that this ear drop she feels caused her swelling.    Flonase [fluticasone Propionate]       Medication List    STOP taking these medications   ciprofloxacin-dexamethasone OTIC suspension Commonly known as: Ciprodex   traMADol 50 MG tablet Commonly known as: ULTRAM     TAKE these medications   artificial tears Oint ophthalmic ointment Commonly known as: LACRILUBE Place into both eyes every 4 (four) hours as needed for dry eyes.   atorvastatin 20  MG tablet Commonly known as: LIPITOR Take 1 tablet (20 mg total) by mouth daily.   fluticasone 50 MCG/ACT nasal spray Commonly known as: FLONASE Place 2 sprays into both nostrils daily.   gabapentin 300 MG  capsule Commonly known as: NEURONTIN Take 1 capsule (300 mg total) by mouth 3 (three) times daily.   ibuprofen 800 MG tablet Commonly known as: ADVIL Take 800 mg by mouth every 8 (eight) hours as needed.   Multivitamin Adult Chew Chew by mouth.   omeprazole 20 MG capsule Commonly known as: PRILOSEC Take 1 capsule (20 mg total) by mouth daily for 14 days.   oxyCODONE-acetaminophen 5-325 MG tablet Commonly known as: PERCOCET/ROXICET Take 1 tablet by mouth every 4 (four) hours as needed for moderate pain.   polyvinyl alcohol 1.4 % ophthalmic solution Commonly known as: LIQUIFILM TEARS Place 1 drop into both eyes 2 (two) times daily.   predniSONE 50 MG tablet Commonly known as: DELTASONE Take 1 tablet (50 mg total) by mouth daily with breakfast for 5 days.   valACYclovir 1000 MG tablet Commonly known as: VALTREX Take 1 tablet (1,000 mg total) by mouth 3 (three) times daily for 7 days.       Follow-up Information    Jearld Fenton, NP. Schedule an appointment as soon as possible for a visit.   Specialties: Internal Medicine, Emergency Medicine Contact information: [address] [place] [address] [phone]              Allergies  Allergen Reactions  . Ciprodex [Ciprofloxacin-Dexamethasone]     Pt states that this ear drop she feels caused her swelling.   Asencion Islam [Fluticasone Propionate]     Consultations:  Infectious disease  Procedures/Studies: CT Head W or Wo Contrast  Result Date: 06/30/2020 CLINICAL DATA:  Cellulitis. Left face and your infection. Left TMJ swelling. Swelling into the submandibular space. EXAM: CT HEAD WITHOUT AND WITH CONTRAST CT MAXILLOFACIAL WITHOUT AND WITH CONTRAST TECHNIQUE: Multidetector CT imaging of the head and maxillofacial structures were performed using the standard protocol without intravenous contrast. Multiplanar CT image reconstructions of the maxillofacial structures were also generated. COMPARISON:  None. FINDINGS: CT  HEAD FINDINGS Brain: No acute infarct, hemorrhage, or mass lesion is present. The ventricles are of normal size. No significant white matter lesions are present. No significant extraaxial fluid collection is present. Postcontrast images demonstrate no pathologic enhancement. The brainstem and cerebellum are within normal limits. Vascular: No hyperdense vessel or unexpected calcification. Skull: Calvarium is intact. No focal lytic or blastic lesions are present. Soft tissue inflammatory changes surround the left ear including the pinna. There is thickening of the skin in the external auditory canal. No fluid collection is present. CT MAXILLOFACIAL FINDINGS Osseous: No fracture or mandibular dislocation. No destructive process. Orbits: Bilateral lens replacements are present. Globes and orbits are otherwise within normal limits. Sinuses: The paranasal sinuses and mastoid air cells are clear. Soft tissues: Skin thickening is present about the left ear. No fluid collection or mass lesion is present. Submandibular gland scratched at there is slight thickening of the left platysma. Submandibular space demonstrates minimal inflammation. Small nodes are present. Subcutaneous fat demonstrates some inflammation over the posterior left face over the parotid gland. 6 mm posterior left parotid lymph node noted. IMPRESSION: 1. Soft tissue inflammatory changes about the left ear including the pinna compatible with acute otitis externa. 2. No fluid collection or mass lesion. 3. Normal CT appearance of the brain. 4. No acute or focal lesion  to explain the patient's symptoms. Electronically Signed   By: San Morelle M.D.   On: 06/30/2020 15:29   MR BRAIN W WO CONTRAST  Result Date: 07/01/2020 CLINICAL DATA:  Left facial pain.  CN 7 neuropathy. EXAM: MRI HEAD WITHOUT AND WITH CONTRAST TECHNIQUE: Multiplanar, multiecho pulse sequences of the brain and surrounding structures were obtained without and with intravenous  contrast. CONTRAST:  7.89mL GADAVIST GADOBUTROL 1 MMOL/ML IV SOLN COMPARISON:  None. FINDINGS: Brain: No acute infarct, mass effect or extra-axial collection. No acute or chronic hemorrhage. There is multifocal hyperintense T2-weighted signal within the white matter. Parenchymal volume and CSF spaces are normal. The midline structures are normal. There is mild contrast enhancement of the canalicular segment, geniculate ganglion and mastoid segment of the left facial nerve. Vascular: Major flow voids are preserved. Skull and upper cervical spine: There is mild hyperintense T2-weighted signal and contrast enhancement of the soft tissues of the left ear and external auditory canal. Sinuses/Orbits:No paranasal sinus fluid levels or advanced mucosal thickening. No mastoid or middle ear effusion. Normal orbits. IMPRESSION: 1. Contrast enhancement of the left ear and external auditory canal soft tissues may indicate otitis externa. 2. Mild contrast enhancement of the canalicular segment, geniculate ganglion and mastoid segment of the left facial nerve. This is nonspecific and, while it may be seen in [name], the segments may also enhance in normal patients. 3. Findings of mild chronic microvascular ischemia. Electronically Signed   By: Ulyses Jarred M.D.   On: 07/01/2020 00:31   CT Maxillofacial W Contrast  Result Date: 06/30/2020 CLINICAL DATA:  Cellulitis. Left face and your infection. Left TMJ swelling. Swelling into the submandibular space. EXAM: CT HEAD WITHOUT AND WITH CONTRAST CT MAXILLOFACIAL WITHOUT AND WITH CONTRAST TECHNIQUE: Multidetector CT imaging of the head and maxillofacial structures were performed using the standard protocol without intravenous contrast. Multiplanar CT image reconstructions of the maxillofacial structures were also generated. COMPARISON:  None. FINDINGS: CT HEAD FINDINGS Brain: No acute infarct, hemorrhage, or mass lesion is present. The ventricles are of normal size. No  significant white matter lesions are present. No significant extraaxial fluid collection is present. Postcontrast images demonstrate no pathologic enhancement. The brainstem and cerebellum are within normal limits. Vascular: No hyperdense vessel or unexpected calcification. Skull: Calvarium is intact. No focal lytic or blastic lesions are present. Soft tissue inflammatory changes surround the left ear including the pinna. There is thickening of the skin in the external auditory canal. No fluid collection is present. CT MAXILLOFACIAL FINDINGS Osseous: No fracture or mandibular dislocation. No destructive process. Orbits: Bilateral lens replacements are present. Globes and orbits are otherwise within normal limits. Sinuses: The paranasal sinuses and mastoid air cells are clear. Soft tissues: Skin thickening is present about the left ear. No fluid collection or mass lesion is present. Submandibular gland scratched at there is slight thickening of the left platysma. Submandibular space demonstrates minimal inflammation. Small nodes are present. Subcutaneous fat demonstrates some inflammation over the posterior left face over the parotid gland. 6 mm posterior left parotid lymph node noted. IMPRESSION: 1. Soft tissue inflammatory changes about the left ear including the pinna compatible with acute otitis externa. 2. No fluid collection or mass lesion. 3. Normal CT appearance of the brain. 4. No acute or focal lesion to explain the patient's symptoms. Electronically Signed   By: San Morelle M.D.   On: 06/30/2020 15:29     Subjective: Patient was seen and examined today.  She was feeling much better and  would like to go home.  Daughter at bedside.  We discussed about seeing an ophthalmologist as soon as possible, patient already made her appointment for 3 PM today.  Discharge Exam: Vitals:   07/04/20 0029 07/04/20 0533  BP: 140/73 119/75  Pulse: 70 64  Resp: 16 16  Temp: 98.3 F (36.8 C) 98 F (36.7  C)  SpO2: 95% 95%   Vitals:   07/03/20 1558 07/03/20 2210 07/04/20 0029 07/04/20 0533  BP: 133/86 133/61 140/73 119/75  Pulse: 74 65 70 64  Resp: 16 20 16 16   Temp: 98.2 F (36.8 C) 98.2 F (36.8 C) 98.3 F (36.8 C) 98 F (36.7 C)  TempSrc: Oral Oral Oral Oral  SpO2: 95% 94% 95% 95%  Weight:      Height:        General: Pt is alert, awake, not in acute distress, left ear lesions seems improving, continue to have left facial palsy, unable to close left eye.  Eyepatch in place. Cardiovascular: RRR, S1/S2 +, no rubs, no gallops Respiratory: CTA bilaterally, no wheezing, no rhonchi Abdominal: Soft, NT, ND, bowel sounds + Extremities: no edema, no cyanosis   The results of significant diagnostics from this hospitalization (including imaging, microbiology, ancillary and laboratory) are listed below for reference.    Microbiology: Recent Results (from the past 240 hour(s))  Culture, blood (routine x 2)     Status: None (Preliminary result)   Collection Time: 06/30/20 12:38 PM   Specimen: BLOOD  Result Value Ref Range Status   Specimen Description BLOOD LEFT ANTECUBITAL  Final   Special Requests   Final    BOTTLES DRAWN AEROBIC AND ANAEROBIC Blood Culture results may not be optimal due to an inadequate volume of blood received in culture bottles   Culture   Final    NO GROWTH 3 DAYS Performed at [facility], [address] [place], [place] [address]    Report Status PENDING  Incomplete  Resp Panel by RT-PCR (Flu A&B, Covid) Nasopharyngeal Swab     Status: None   Collection Time: 06/30/20 12:38 PM   Specimen: Nasopharyngeal Swab; Nasopharyngeal(NP) swabs in vial transport medium  Result Value Ref Range Status   SARS Coronavirus 2 by RT PCR NEGATIVE NEGATIVE Final    Comment: (NOTE) SARS-CoV-2 target nucleic acids are NOT DETECTED.  The SARS-CoV-2 RNA is generally detectable in upper respiratory specimens during the acute phase of infection. The  lowest concentration of SARS-CoV-2 viral copies this assay can detect is 138 copies/mL. A negative result does not preclude SARS-Cov-2 infection and should not be used as the sole basis for treatment or other patient management decisions. A negative result may occur with  improper specimen collection/handling, submission of specimen other than nasopharyngeal swab, presence of viral mutation(s) within the areas targeted by this assay, and inadequate number of viral copies(<138 copies/mL). A negative result must be combined with clinical observations, patient history, and epidemiological information. The expected result is Negative.  Fact Sheet for Patients:  EntrepreneurPulse.com.au  Fact Sheet for Healthcare Providers:  IncredibleEmployment.be  This test is no t yet approved or cleared by the [place] FDA and  has been authorized for detection and/or diagnosis of SARS-CoV-2 by FDA under an Emergency Use Authorization (EUA). This EUA will remain  in effect (meaning this test can be used) for the duration of the COVID-19 declaration under Section 564(b)(1) of the Act, 21 U.S.C.section 360bbb-3(b)(1), unless the authorization is terminated  or revoked sooner.  Influenza A by PCR NEGATIVE NEGATIVE Final   Influenza B by PCR NEGATIVE NEGATIVE Final    Comment: (NOTE) The Xpert Xpress SARS-CoV-2/FLU/RSV plus assay is intended as an aid in the diagnosis of influenza from Nasopharyngeal swab specimens and should not be used as a sole basis for treatment. Nasal washings and aspirates are unacceptable for Xpert Xpress SARS-CoV-2/FLU/RSV testing.  Fact Sheet for Patients: EntrepreneurPulse.com.au  Fact Sheet for Healthcare Providers: IncredibleEmployment.be  This test is not yet approved or cleared by the [place] FDA and has been authorized for detection and/or diagnosis of SARS-CoV-2 by FDA under  an Emergency Use Authorization (EUA). This EUA will remain in effect (meaning this test can be used) for the duration of the COVID-19 declaration under Section 564(b)(1) of the Act, 21 U.S.C. section 360bbb-3(b)(1), unless the authorization is terminated or revoked.  Performed at [facility], [address] [place], [place] [address]   Culture, blood (Routine X 2) w Reflex to ID Panel     Status: None (Preliminary result)   Collection Time: 07/01/20  7:59 AM   Specimen: BLOOD  Result Value Ref Range Status   Specimen Description BLOOD  RIGHT ARM  Final   Special Requests   Final    BOTTLES DRAWN AEROBIC AND ANAEROBIC Blood Culture adequate volume   Culture   Final    NO GROWTH 2 DAYS Performed at [facility], [address] [place],  [address]    Report Status PENDING  Incomplete     Labs: BNP (last 3 results) No results for input(s): BNP in the last 8760 hours. Basic Metabolic Panel: Recent Labs  Lab 06/30/20 1054 07/01/20 0405 07/02/20 0459 07/03/20 0309 07/04/20 0226  NA 137 136 137 136 137  K 3.8 4.5 5.0 4.4 5.0  CL 102 101 102 102 101  CO2 27 25 27 28 30   GLUCOSE 105* 153* 140* 137* 141*  BUN 19 23 27* 29* 26*  CREATININE 0.64 0.77 0.65 0.73 0.62  CALCIUM 9.2 9.2 9.2 9.0 9.1  MG  --   --  2.3 2.3 2.4   Liver Function Tests: No results for input(s): AST, ALT, ALKPHOS, BILITOT, PROT, ALBUMIN in the last 168 hours. No results for input(s): LIPASE, AMYLASE in the last 168 hours. No results for input(s): AMMONIA in the last 168 hours. CBC: Recent Labs  Lab 06/30/20 1054 07/01/20 0405 07/02/20 0459 07/03/20 0309 07/04/20 0226  WBC 7.9 9.9 10.1 10.2 10.7*  NEUTROABS 5.8  --   --   --   --   HGB 14.4 14.2 14.4 14.0 13.8  HCT 41.0 41.6 40.5 41.9 40.9  MCV 89.7 92.9 91.2 92.5 93.0  PLT 240 242 248 264 271   Cardiac Enzymes: No results for input(s): CKTOTAL, CKMB, CKMBINDEX, TROPONINI in the last 168 hours. BNP: Invalid  input(s): POCBNP CBG: No results for input(s): GLUCAP in the last 168 hours. D-Dimer No results for input(s): DDIMER in the last 72 hours. Hgb A1c No results for input(s): HGBA1C in the last 72 hours. Lipid Profile No results for input(s): CHOL, HDL, LDLCALC, TRIG, CHOLHDL, LDLDIRECT in the last 72 hours. Thyroid function studies No results for input(s): TSH, T4TOTAL, T3FREE, THYROIDAB in the last 72 hours.  Invalid input(s): FREET3 Anemia work up No results for input(s): VITAMINB12, FOLATE, FERRITIN, TIBC, IRON, RETICCTPCT in the last 72 hours. Urinalysis    Component Value Date/Time   BILIRUBINUR Negative 05/27/2019 0840   PROTEINUR Negative 05/27/2019 0840   UROBILINOGEN  0.2 05/27/2019 0840   NITRITE Negative 05/27/2019 0840   LEUKOCYTESUR Small (1+) (A) 05/27/2019 0840   Sepsis Labs Invalid input(s): PROCALCITONIN,  WBC,  LACTICIDVEN Microbiology Recent Results (from the past 240 hour(s))  Culture, blood (routine x 2)     Status: None (Preliminary result)   Collection Time: 06/30/20 12:38 PM   Specimen: BLOOD  Result Value Ref Range Status   Specimen Description BLOOD LEFT ANTECUBITAL  Final   Special Requests   Final    BOTTLES DRAWN AEROBIC AND ANAEROBIC Blood Culture results may not be optimal due to an inadequate volume of blood received in culture bottles   Culture   Final    NO GROWTH 3 DAYS Performed at [facility], [address] [place], [place] [address]    Report Status PENDING  Incomplete  Resp Panel by RT-PCR (Flu A&B, Covid) Nasopharyngeal Swab     Status: None   Collection Time: 06/30/20 12:38 PM   Specimen: Nasopharyngeal Swab; Nasopharyngeal(NP) swabs in vial transport medium  Result Value Ref Range Status   SARS Coronavirus 2 by RT PCR NEGATIVE NEGATIVE Final    Comment: (NOTE) SARS-CoV-2 target nucleic acids are NOT DETECTED.  The SARS-CoV-2 RNA is generally detectable in upper respiratory specimens during the acute phase of  infection. The lowest concentration of SARS-CoV-2 viral copies this assay can detect is 138 copies/mL. A negative result does not preclude SARS-Cov-2 infection and should not be used as the sole basis for treatment or other patient management decisions. A negative result may occur with  improper specimen collection/handling, submission of specimen other than nasopharyngeal swab, presence of viral mutation(s) within the areas targeted by this assay, and inadequate number of viral copies(<138 copies/mL). A negative result must be combined with clinical observations, patient history, and epidemiological information. The expected result is Negative.  Fact Sheet for Patients:  EntrepreneurPulse.com.au  Fact Sheet for Healthcare Providers:  IncredibleEmployment.be  This test is no t yet approved or cleared by the [place] FDA and  has been authorized for detection and/or diagnosis of SARS-CoV-2 by FDA under an Emergency Use Authorization (EUA). This EUA will remain  in effect (meaning this test can be used) for the duration of the COVID-19 declaration under Section 564(b)(1) of the Act, 21 U.S.C.section 360bbb-3(b)(1), unless the authorization is terminated  or revoked sooner.       Influenza A by PCR NEGATIVE NEGATIVE Final   Influenza B by PCR NEGATIVE NEGATIVE Final    Comment: (NOTE) The Xpert Xpress SARS-CoV-2/FLU/RSV plus assay is intended as an aid in the diagnosis of influenza from Nasopharyngeal swab specimens and should not be used as a sole basis for treatment. Nasal washings and aspirates are unacceptable for Xpert Xpress SARS-CoV-2/FLU/RSV testing.  Fact Sheet for Patients: EntrepreneurPulse.com.au  Fact Sheet for Healthcare Providers: IncredibleEmployment.be  This test is not yet approved or cleared by the [place] FDA and has been authorized for detection and/or diagnosis of SARS-CoV-2  by FDA under an Emergency Use Authorization (EUA). This EUA will remain in effect (meaning this test can be used) for the duration of the COVID-19 declaration under Section 564(b)(1) of the Act, 21 U.S.C. section 360bbb-3(b)(1), unless the authorization is terminated or revoked.  Performed at [facility], [address] [place], [place] [address]   Culture, blood (Routine X 2) w Reflex to ID Panel     Status: None (Preliminary result)   Collection Time: 07/01/20  7:59 AM   Specimen: BLOOD  Result  Value Ref Range Status   Specimen Description BLOOD  RIGHT ARM  Final   Special Requests   Final    BOTTLES DRAWN AEROBIC AND ANAEROBIC Blood Culture adequate volume   Culture   Final    NO GROWTH 2 DAYS Performed at [facility], [address] [place], [place] [address]    Report Status PENDING  Incomplete    Time coordinating discharge: Over 30 minutes  SIGNED:  Lorella Nimrod, MD  [facility] 07/04/2020, 10:03 AM  If 7PM-7AM, please contact night-coverage [email or web address]  This record has been created using Systems analyst. Errors have been sought and corrected,but may not always be located. Such creation errors do not reflect on the standard of care.

## 2020-07-05 LAB — CULTURE, BLOOD (ROUTINE X 2): Culture: NO GROWTH

## 2020-07-06 LAB — CULTURE, BLOOD (ROUTINE X 2)
Culture: NO GROWTH
Special Requests: ADEQUATE

## 2020-07-08 LAB — VARICELLA-ZOSTER BY PCR: Varicella-Zoster, PCR: POSITIVE — AB

## 2020-07-12 ENCOUNTER — Inpatient Hospital Stay: Payer: Medicare HMO | Admitting: Internal Medicine

## 2020-07-12 ENCOUNTER — Ambulatory Visit: Payer: Medicare HMO | Admitting: Internal Medicine

## 2020-07-12 ENCOUNTER — Ambulatory Visit (INDEPENDENT_AMBULATORY_CARE_PROVIDER_SITE_OTHER): Payer: Medicare HMO | Admitting: Internal Medicine

## 2020-07-12 ENCOUNTER — Other Ambulatory Visit: Payer: Self-pay

## 2020-07-12 ENCOUNTER — Encounter: Payer: Self-pay | Admitting: Internal Medicine

## 2020-07-12 VITALS — BP 127/83 | HR 92 | Temp 98.5°F | Ht 61.85 in | Wt 181.8 lb

## 2020-07-12 DIAGNOSIS — R8271 Bacteriuria: Secondary | ICD-10-CM | POA: Diagnosis not present

## 2020-07-12 DIAGNOSIS — G510 Bell's palsy: Secondary | ICD-10-CM | POA: Diagnosis not present

## 2020-07-12 DIAGNOSIS — B0230 Zoster ocular disease, unspecified: Secondary | ICD-10-CM

## 2020-07-12 LAB — MICROSCOPIC EXAMINATION: RBC, Urine: NONE SEEN /hpf (ref 0–2)

## 2020-07-12 LAB — BAYER DCA HB A1C WAIVED: HB A1C (BAYER DCA - WAIVED): 5.9 % (ref <7.0)

## 2020-07-12 LAB — URINALYSIS, ROUTINE W REFLEX MICROSCOPIC
Bilirubin, UA: NEGATIVE
Glucose, UA: NEGATIVE
Ketones, UA: NEGATIVE
Nitrite, UA: NEGATIVE
RBC, UA: NEGATIVE
Specific Gravity, UA: 1.02 (ref 1.005–1.030)
Urobilinogen, Ur: 0.2 mg/dL (ref 0.2–1.0)
pH, UA: 7 (ref 5.0–7.5)

## 2020-07-12 MED ORDER — OXYCODONE-ACETAMINOPHEN 5-325 MG PO TABS: 1.0000 | ORAL_TABLET | Freq: Three times a day (TID) | ORAL | 0 refills | Status: AC | PRN

## 2020-07-12 MED ORDER — PREGABALIN 75 MG PO CAPS: 75.0000 mg | ORAL_CAPSULE | Freq: Every day | ORAL | 1 refills | Status: DC

## 2020-07-12 MED ORDER — ONDANSETRON HCL 8 MG PO TABS: 8.0000 mg | ORAL_TABLET | Freq: Three times a day (TID) | ORAL | 0 refills | Status: DC | PRN

## 2020-07-12 NOTE — Addendum Note (Signed)
Addended by: Donzetta Kohut A on: 07/12/2020 04:00 PM   Modules accepted: Orders

## 2020-07-12 NOTE — Progress Notes (Signed)
BP 127/83   Pulse 92   Temp 98.5 F (36.9 C) (Oral)   Ht 5' 1.85" (1.571 m)   Wt 181 lb 12.8 oz (82.5 kg)   SpO2 98%   BMI 33.41 kg/m    Subjective:    Patient ID: Abigail Delacruz, female    DOB: 06/09/1950, 70 y.o.   MRN: [id number]  HPI: Abigail Delacruz is a 70 y.o. female  Patient is here to establish care she was seen in the practice in [place]  and had left sided [name] secondary to shingles.x 2 weeks ago   Was diagnosed with ramsay hunts syndrome is on gabapentin 300 mg tid , oxycodone and percocet   Dizziness This is a chronic (started today has been worse this morning couldnt get out of bed ) problem. Pertinent negatives include no abdominal pain, chest pain, nausea or sore throat.  Gastroesophageal Reflux She complains of heartburn. She reports no abdominal pain, no chest pain, no choking, no dysphagia, no early satiety, no globus sensation, no hoarse voice, no nausea, no sore throat, no stridor, no tooth decay, no water brash or no wheezing.    Chief Complaint  Patient presents with  . New Patient (Initial Visit)    Patient states she is out of pain medication, was recently in hospital for shingles in her head and is coming out of her ear.  . Dizziness  . Nausea  . Bells Palsy    Relevant past medical, surgical, family and social history reviewed and updated as indicated. Interim medical history since our last visit reviewed. Allergies and medications reviewed and updated.  Review of Systems  HENT: Negative for hoarse voice and sore throat.   Respiratory: Negative for choking and wheezing.   Cardiovascular: Negative for chest pain.  Gastrointestinal: Positive for heartburn. Negative for abdominal pain, dysphagia and nausea.  Neurological: Positive for dizziness.    Per HPI unless specifically indicated above     Objective:    BP 127/83   Pulse 92   Temp 98.5 F (36.9 C) (Oral)   Ht 5' 1.85" (1.571 m)   Wt 181 lb 12.8 oz (82.5 kg)   SpO2 98%    BMI 33.41 kg/m   Wt Readings from Last 3 Encounters:  07/12/20 181 lb 12.8 oz (82.5 kg)  06/30/20 184 lb (83.5 kg)  06/29/20 184 lb 12.8 oz (83.8 kg)    Physical Exam Vitals reviewed: Left eye Band-Aid and taped secondary to shingles and [name] noted on the left side of her face.  Constitutional:      General: She is not in acute distress.    Appearance: Normal appearance. She is obese. She is not ill-appearing or diaphoretic.  Cardiovascular:     Rate and Rhythm: Regular rhythm. Tachycardia present.     Pulses: Normal pulses.     Heart sounds: Normal heart sounds.  Pulmonary:     Effort: Pulmonary effort is normal. No respiratory distress.     Breath sounds: Normal breath sounds. No stridor. No wheezing.  Musculoskeletal:        General: No swelling, tenderness or signs of injury.     Cervical back: No rigidity or tenderness.     Right lower leg: No edema.     Left lower leg: No edema.  Skin:    General: Skin is warm.     Coloration: Skin is not pale.     Findings: No erythema or rash.  Neurological:  Mental Status: She is alert.  Psychiatric:        Mood and Affect: Mood normal.        Behavior: Behavior normal.           Current Outpatient Medications:  .  artificial tears (LACRILUBE) OINT ophthalmic ointment, Place into both eyes every 4 (four) hours as needed for dry eyes., Disp: 1 g, Rfl: 1 .  atorvastatin (LIPITOR) 20 MG tablet, Take 1 tablet (20 mg total) by mouth daily., Disp: 90 tablet, Rfl: 0 .  gabapentin (NEURONTIN) 300 MG capsule, Take 1 capsule (300 mg total) by mouth 3 (three) times daily., Disp: 90 capsule, Rfl: 0 .  Multiple Vitamins-Minerals (MULTIVITAMIN ADULT) CHEW, Chew by mouth., Disp: , Rfl:  .  oxyCODONE-acetaminophen (PERCOCET/ROXICET) 5-325 MG tablet, Take 1 tablet by mouth every 4 (four) hours as needed for moderate pain., Disp: 20 tablet, Rfl: 0 .  polyvinyl alcohol (LIQUIFILM TEARS) 1.4 % ophthalmic solution, Place 1 drop into  both eyes 2 (two) times daily., Disp: 15 mL, Rfl: 0 .  omeprazole (PRILOSEC) 20 MG capsule, Take 1 capsule (20 mg total) by mouth daily for 14 days., Disp: 90 capsule, Rfl: 1    Assessment & Plan:  1. [name]: follow-up with neurology for such. Herpes ulcer infection/shingles post Valtrex hospitalization for such. Patient had Ramsay Hunt syndrome. Today A1c was checked and is in the prediabetic rangeResults for ALLANNAH, KEMPEN (MRN [id number]) as of 07/12/2020 14:50  Ref. Range 07/12/2020 11:39  HB A1C (BAYER DCA - WAIVED) Latest Ref Range: <7.0 % 5.9   2. CornealAbrasions: Continue taping the left eye will probably need to obtain a patch from the pharmacy  will need to follow-up with ophthalmology for her left eye has corneal abrasions from itching.   3.ain management secondary to post herpetic  Neuralgia Oxycodone  refilled x1, Lyrica ordered q. p.m. consider increasing the dose/changing dosing to twice daily if patient continues to need this. Says Neurontin does not work and help her with her pain.  Is on Neurontin 300 mg 3 times daily for such  4. Dzziness ? econdary to his including narcotics, Band-Aids and left eye and only looking through 1 eye, Consider echo/ultrasound carotids if continues to be dizzy. Patient was found to have acute otitis externa per imaging done including MRI of her brain effusion. Normal orbits.  IMPRESSION: 1. Contrast enhancement of the left ear and external auditory canal soft tissues may indicate otitis externa. 2. Mild contrast enhancement of the canalicular segment, geniculate ganglion and mastoid segment of the left facial nerve. This is nonspecific and, while it may be seen in [name], the segments may also enhance in normal patients. 3. Findings of mild chronic microvascular ischemia.    Problem List Items Addressed This Visit   None   Visit Diagnoses    Herpes zoster with ophthalmic complication, unspecified herpes zoster eye  disease    -  Primary   Relevant Orders   Bayer DCA Hb A1c Waived (STAT) (Completed)   Ambulatory referral to Neurology   CBC with Differential/Platelet   Comprehensive metabolic panel   TSH   Urinalysis, Routine w reflex microscopic (Completed)   [name]       Relevant Medications   pregabalin (LYRICA) 75 MG capsule   Other Relevant Orders   Ambulatory referral to Neurology   CBC with Differential/Platelet   Comprehensive metabolic panel   TSH   Urinalysis, Routine w reflex microscopic (Completed)  Follow up plan: No follow-ups on file.

## 2020-07-12 NOTE — Patient Instructions (Signed)
 [name], Adult  [name] is a short-term inability to move muscles in a part of the face. The inability to move, also called paralysis, results from inflammation or compression of the seventh cranial nerve. This nerve travels along the skull and under the ear to the side of the face. This nerve is responsible for facial movements that include blinking, closing the eyes, smiling, and frowning. What are the causes? The exact cause of this condition is not known. It may be caused by an infection from a virus, such as the chickenpox (herpes zoster), Epstein-Barr, or mumps virus. What increases the risk? You are more likely to develop this condition if:  You are pregnant.  You have diabetes.  You have had a recent infection in your nose, throat, or airways.  You have a weakened body defense system (immune system).  You have had a facial injury, such as a fracture.  You have a family history of [name]. What are the signs or symptoms? Symptoms of this condition include:  Weakness on one side of the face.  Drooping eyelid and corner of the mouth.  Excessive tearing in one eye.  Difficulty closing the eyelid.  Dry eye.  Drooling.  Dry mouth.  Changes in taste.  Change in facial appearance.  Pain behind one ear.  Ringing in one or both ears.  Sensitivity to sound in one ear.  Facial twitching.  Headache.  Impaired speech.  Dizziness.  Difficulty eating or drinking. Most of the time, only one side of the face is affected. In rare cases, [name] may affect the whole face. How is this diagnosed? This condition is diagnosed based on:  Your symptoms.  Your medical history.  A physical exam. You may also have to see health care providers who specialize in disorders of the nerves (neurologist) or diseases and conditions of the eye (ophthalmologist). You may have tests, such as:  A test to check for nerve damage (electromyogram).  Imaging  studies, such as a CT scan or an MRI.  Blood tests. How is this treated? This condition affects every person differently. Sometimes symptoms go away without treatment within a couple weeks. If treatment is needed, it varies from person to person. The goal of treatment is to reduce inflammation and protect the eye from damage. Treatment for [name] may include:  Medicines, such as: ? Steroids to reduce swelling and inflammation. ? Antiviral medicines. ? Pain relievers, including aspirin, acetaminophen, or ibuprofen.  Eye drops or ointment to keep your eye moist.  Eye protection, if you cannot close your eye.  Exercises or massage to regain muscle strength and function (physical therapy). Follow these instructions at home:  Take over-the-counter and prescription medicines only as told by your health care provider.  If your eye is affected: ? Keep your eye moist with eye drops or ointment as told by your health care provider. ? Follow instructions for eye care and protection as told by your health care provider.  Do any physical therapy exercises as told by your health care provider.  Keep all follow-up visits. This is important.   Contact a health care provider if:  You have a fever or chills.  Your symptoms do not get better within 2-3 weeks, or your symptoms get worse.  Your eye is red, irritated, or painful.  You have new symptoms. Get help right away if:  You have weakness or numbness in a part of your body other than your face.  You   have trouble swallowing.  You develop neck pain or stiffness.  You develop dizziness or shortness of breath. Summary  [name] is a short-term inability to move muscles in a part of the face. The inability to move results from inflammation or compression of the facial nerve.  This condition affects every person differently. Sometimes symptoms go away without treatment within a couple weeks.  If treatment is needed, it varies  from person to person. The goal of treatment is to reduce inflammation and protect the eye from damage.  Contact your health care provider if your symptoms do not get better within 2-3 weeks, or your symptoms get worse. This information is not intended to replace advice given to you by your health care provider. Make sure you discuss any questions you have with your health care provider. Document Revised: 11/11/2019 Document Reviewed: 11/11/2019 Elsevier Patient Education  2021 [employer]  

## 2020-07-13 LAB — CBC WITH DIFFERENTIAL/PLATELET
Basophils Absolute: 0 10*3/uL (ref 0.0–0.2)
Basos: 0 %
EOS (ABSOLUTE): 0.2 10*3/uL (ref 0.0–0.4)
Eos: 2 %
Hematocrit: 46.7 % — ABNORMAL HIGH (ref 34.0–46.6)
Hemoglobin: 15.3 g/dL (ref 11.1–15.9)
Immature Grans (Abs): 0 10*3/uL (ref 0.0–0.1)
Immature Granulocytes: 0 %
Lymphocytes Absolute: 1.9 10*3/uL (ref 0.7–3.1)
Lymphs: 17 %
MCH: 31 pg (ref 26.6–33.0)
MCHC: 32.8 g/dL (ref 31.5–35.7)
MCV: 95 fL (ref 79–97)
Monocytes Absolute: 0.7 10*3/uL (ref 0.1–0.9)
Monocytes: 6 %
Neutrophils Absolute: 8.6 10*3/uL — ABNORMAL HIGH (ref 1.4–7.0)
Neutrophils: 75 %
Platelets: 280 10*3/uL (ref 150–450)
RBC: 4.93 x10E6/uL (ref 3.77–5.28)
RDW: 13.8 % (ref 11.7–15.4)
WBC: 11.4 10*3/uL — ABNORMAL HIGH (ref 3.4–10.8)

## 2020-07-13 LAB — COMPREHENSIVE METABOLIC PANEL
ALT: 49 IU/L — ABNORMAL HIGH (ref 0–32)
AST: 21 IU/L (ref 0–40)
Albumin/Globulin Ratio: 1.7 (ref 1.2–2.2)
Albumin: 4.4 g/dL (ref 3.8–4.8)
Alkaline Phosphatase: 148 IU/L — ABNORMAL HIGH (ref 44–121)
BUN/Creatinine Ratio: 32 — ABNORMAL HIGH (ref 12–28)
BUN: 28 mg/dL — ABNORMAL HIGH (ref 8–27)
Bilirubin Total: 0.5 mg/dL (ref 0.0–1.2)
CO2: 25 mmol/L (ref 20–29)
Calcium: 9.5 mg/dL (ref 8.7–10.3)
Chloride: 92 mmol/L — ABNORMAL LOW (ref 96–106)
Creatinine, Ser: 0.87 mg/dL (ref 0.57–1.00)
Globulin, Total: 2.6 g/dL (ref 1.5–4.5)
Glucose: 98 mg/dL (ref 65–99)
Potassium: 4.4 mmol/L (ref 3.5–5.2)
Sodium: 136 mmol/L (ref 134–144)
Total Protein: 7 g/dL (ref 6.0–8.5)
eGFR: 72 mL/min/{1.73_m2} (ref >59)

## 2020-07-13 LAB — TSH: TSH: 1.39 u[IU]/mL (ref 0.450–4.500)

## 2020-07-14 LAB — URINE CULTURE: Organism ID, Bacteria: NO GROWTH

## 2020-07-17 ENCOUNTER — Other Ambulatory Visit: Payer: Self-pay

## 2020-07-17 ENCOUNTER — Encounter: Payer: Self-pay | Admitting: Nurse Practitioner

## 2020-07-17 ENCOUNTER — Ambulatory Visit (INDEPENDENT_AMBULATORY_CARE_PROVIDER_SITE_OTHER): Payer: Medicare HMO | Admitting: Nurse Practitioner

## 2020-07-17 VITALS — BP 128/79 | HR 71 | Temp 98.6°F | Wt 181.4 lb

## 2020-07-17 DIAGNOSIS — B0230 Zoster ocular disease, unspecified: Secondary | ICD-10-CM

## 2020-07-17 MED ORDER — GABAPENTIN 300 MG PO CAPS: 300.0000 mg | ORAL_CAPSULE | Freq: Three times a day (TID) | ORAL | 1 refills | Status: DC

## 2020-07-17 NOTE — Patient Instructions (Addendum)
It was great to see you!  Stop taking your lyrica (pregabalin). Start taking your gabapentin 3 times a day. You can take 1 tablet tonight before bed, and then start 3 times a day tomorrow. I am putting in the referral to Dr. Kasandra Knudsen Raynelle Bring, neurologist.   Let's follow-up in 2 weeks, sooner if you have concerns.  If a referral was placed today, you will be contacted for an appointment. Please note that routine referrals can sometimes take up to 3-4 weeks to process. Please call our office if you haven't heard anything after this time frame.  Take care, Vance Peper, NP   Shingles  Shingles, which is also known as herpes zoster, is an infection that causes a painful skin rash and fluid-filled blisters. It is caused by a virus. Shingles only develops in people who:  Have had chickenpox.  Have been given a medicine to protect against chickenpox (have been vaccinated). Shingles is rare in this group. What are the causes? Shingles is caused by varicella-zoster virus (VZV). This is the same virus that causes chickenpox. After a person is exposed to VZV, the virus stays in the body in an inactive (dormant) state. Shingles develops if the virus is reactivated. This can happen many years after the first (initial) exposure to VZV. It is not known what causes this virus to be reactivated. What increases the risk? People who have had chickenpox or received the chickenpox vaccine are at risk for shingles. Shingles infection is more common in people who:  Are older than age 26.  Have a weakened disease-fighting system (immune system), such as people with: ? HIV. ? AIDS. ? Cancer.  Are taking medicines that weaken the immune system, such as transplant medicines.  Are experiencing a lot of stress. What are the signs or symptoms? Early symptoms of this condition include itching, tingling, and pain in an area on your skin. Pain may be described as burning, stabbing, or throbbing. A few days or  weeks after early symptoms start, a painful red rash appears. The rash is usually on one side of the body and has a band-like or belt-like pattern. The rash eventually turns into fluid-filled blisters that break open, change into scabs, and dry up in about 2-3 weeks. At any time during the infection, you may also develop:  A fever.  Chills.  A headache.  An upset stomach. How is this diagnosed? This condition is diagnosed with a skin exam. Skin or fluid samples may be taken from the blisters before a diagnosis is made. These samples are examined under a microscope or sent to a lab for testing. How is this treated? The rash may last for several weeks. There is not a specific cure for this condition. Your health care provider will probably prescribe medicines to help you manage pain, recover more quickly, and avoid long-term problems. Medicines may include:  Antiviral drugs.  Anti-inflammatory drugs.  Pain medicines.  Anti-itching medicines (antihistamines). If the area involved is on your face, you may be referred to a specialist, such as an eye doctor (ophthalmologist) or an ear, nose, and throat (ENT) doctor (otolaryngologist) to help you avoid eye problems, chronic pain, or disability. Follow these instructions at home: Medicines  Take over-the-counter and prescription medicines only as told by your health care provider.  Apply an anti-itch cream or numbing cream to the affected area as told by your health care provider. Relieving itching and discomfort  Apply cold, wet cloths (cold compresses) to the area of  the rash or blisters as told by your health care provider.  Cool baths can be soothing. Try adding baking soda or dry oatmeal to the water to reduce itching. Do not bathe in hot water.   Blister and rash care  Keep your rash covered with a loose bandage (dressing). Wear loose-fitting clothing to help ease the pain of material rubbing against the rash.  Keep your rash and  blisters clean by washing the area with mild soap and cool water as told by your health care provider.  Check your rash every day for signs of infection. Check for: ? More redness, swelling, or pain. ? Fluid or blood. ? Warmth. ? Pus or a bad smell.  Do not scratch your rash or pick at your blisters. To help avoid scratching: ? Keep your fingernails clean and cut short. ? Wear gloves or mittens while you sleep, if scratching is a problem. General instructions  Rest as told by your health care provider.  Keep all follow-up visits as told by your health care provider. This is important.  Wash your hands often with soap and water. If soap and water are not available, use hand sanitizer. Doing this lowers your chance of getting a bacterial skin infection.  Before your blisters change into scabs, your shingles infection can cause chickenpox in people who have never had it or have never been vaccinated against it. To prevent this from happening, avoid contact with other people, especially: ? Babies. ? Pregnant women. ? Children who have eczema. ? Elderly people who have transplants. ? People who have chronic illnesses, such as cancer or AIDS. Contact a health care provider if:  Your pain is not relieved with prescribed medicines.  Your pain does not get better after the rash heals.  You have signs of infection in the rash area, such as: ? More redness, swelling, or pain around the rash. ? Fluid or blood coming from the rash. ? The rash area feeling warm to the touch. ? Pus or a bad smell coming from the rash. Get help right away if:  The rash is on your face or nose.  You have facial pain, pain around your eye area, or loss of feeling on one side of your face.  You have difficulty seeing.  You have ear pain or have ringing in your ear.  You have a loss of taste.  Your condition gets worse. Summary  Shingles, which is also known as herpes zoster, is an infection that  causes a painful skin rash and fluid-filled blisters.  This condition is diagnosed with a skin exam. Skin or fluid samples may be taken from the blisters and examined before the diagnosis is made.  Keep your rash covered with a loose bandage (dressing). Wear loose-fitting clothing to help ease the pain of material rubbing against the rash.  Before your blisters change into scabs, your shingles infection can cause chickenpox in people who have never had it or have never been vaccinated against it. This information is not intended to replace advice given to you by your health care provider. Make sure you discuss any questions you have with your health care provider. Document Revised: 06/05/2018 Document Reviewed: 10/16/2016 Elsevier Patient Education  2021 [employer].

## 2020-07-17 NOTE — Progress Notes (Signed)
Established Patient Office Visit  Subjective:  Patient ID: Abigail Delacruz, female    DOB: 01/11/1951  Age: 70 y.o. MRN: [id number]  CC:  Chief Complaint  Patient presents with  . Herpes Zoster    1 week f/up- pt states she is feeling a little bit better. Not in pain today.     HPI Abigail Delacruz presents for follow up of herpes zoster. She states that her eye is feeling much better and has been following closely with opthalmology. She is still patching her eye. She has slightly more movement in her cheek and eye, but not fully able to close her eye yet. She states that her left ear is still very painful, but is less swollen and red. She has been having dizziness and nausea with percocet, so she stopped taking that pain medication on Saturday. She was started on lyrica instead of gabapentin and has also noted dizziness and unsteadiness on her feet with this medication. It controls the pain well for about 12 hours and then some nights she would wake up in pain and need to take a second dose of the lyrica. She and her daughters are concerned that the lyrica is making her dizzy and unsteady on her feet and would like to go back to taking the gabapentin. When the pain is at it's worst, it is 8/10, however the medication does help the pain go completely away for several hours at a time. She denies falls.    Past Medical History:  Diagnosis Date  . Cataract   . Hyperlipidemia     Past Surgical History:  Procedure Laterality Date  . APPENDECTOMY  1971  . CATARACT EXTRACTION W/PHACO Right 09/08/2017   Procedure: CATARACT EXTRACTION PHACO AND INTRAOCULAR LENS PLACEMENT ([place])  RIGHT;  Surgeon: Eulogio Bear, MD;  Location: [facility];  Service: Ophthalmology;  Laterality: Right;  . CATARACT EXTRACTION W/PHACO Left 09/29/2017   Procedure: CATARACT EXTRACTION PHACO AND INTRAOCULAR LENS PLACEMENT ([place]) LEFT;  Surgeon: Eulogio Bear, MD;  Location: [facility];  Service:  Ophthalmology;  Laterality: Left;  . COLONOSCOPY WITH PROPOFOL N/A 10/20/2017   Procedure: COLONOSCOPY WITH PROPOFOL;  Surgeon: Lucilla Lame, MD;  Location: [facility];  Service: Endoscopy;  Laterality: N/A;    Family History  Problem Relation Age of Onset  . Kidney failure Mother   . Cirrhosis Father   . Alcohol abuse Father   . Diabetes Brother   . Hypertension Brother   . Arthritis Brother   . Breast cancer Neg Hx     Social History   Socioeconomic History  . Marital status: Married    Spouse name: Not on file  . Number of children: 2  . Years of education: Not on file  . Highest education level: Some college, no degree  Occupational History  . Not on file  Tobacco Use  . Smoking status: Former Smoker    Quit date: 1974    Years since quitting: 48.4  . Smokeless tobacco: Never Used  Vaping Use  . Vaping Use: Never used  Substance and Sexual Activity  . Alcohol use: Yes    Comment: social 1x/yr  . Drug use: Never  . Sexual activity: Not Currently  Other Topics Concern  . Not on file  Social History Narrative  . Not on file   Social Determinants of Health   Financial Resource Strain: Low Risk   . Difficulty of Paying Living Expenses: Not hard at all  Food Insecurity: No Food Insecurity  . Worried About Charity fundraiser in the Last Year: Never true  . Ran Out of Food in the Last Year: Never true  Transportation Needs: No Transportation Needs  . Lack of Transportation (Medical): No  . Lack of Transportation (Non-Medical): No  Physical Activity: Inactive  . Days of Exercise per Week: 0 days  . Minutes of Exercise per Session: 0 min  Stress: Not on file  Social Connections: Socially Integrated  . Frequency of Communication with Friends and Family: More than three times a week  . Frequency of Social Gatherings with Friends and Family: More than three times a week  . Attends Religious Services: More than 4 times per year  . Active Member of Clubs or  Organizations: Yes  . Attends Archivist Meetings: More than 4 times per year  . Marital Status: Married  Human resources officer Violence: Not on file    Outpatient Medications Prior to Visit  Medication Sig Dispense Refill  . artificial tears (LACRILUBE) OINT ophthalmic ointment Place into both eyes every 4 (four) hours as needed for dry eyes. 1 g 1  . atorvastatin (LIPITOR) 20 MG tablet Take 1 tablet (20 mg total) by mouth daily. 90 tablet 0  . Multiple Vitamins-Minerals (MULTIVITAMIN ADULT) CHEW Chew by mouth.    . ondansetron (ZOFRAN) 8 MG tablet Take 1 tablet (8 mg total) by mouth every 8 (eight) hours as needed for nausea or vomiting. 15 tablet 0  . polyvinyl alcohol (LIQUIFILM TEARS) 1.4 % ophthalmic solution Place 1 drop into both eyes 2 (two) times daily. 15 mL 0  . pregabalin (LYRICA) 75 MG capsule Take 1 capsule (75 mg total) by mouth daily. 30 capsule 1  . omeprazole (PRILOSEC) 20 MG capsule Take 1 capsule (20 mg total) by mouth daily for 14 days. 90 capsule 1  . oxyCODONE-acetaminophen (PERCOCET/ROXICET) 5-325 MG tablet Take 1 tablet by mouth every 8 (eight) hours as needed for up to 7 days for severe pain. (Patient not taking: Reported on 07/17/2020) 21 tablet 0   No facility-administered medications prior to visit.    Allergies  Allergen Reactions  . Ciprodex [Ciprofloxacin-Dexamethasone]     Pt states that this ear drop she feels caused her swelling.     ROS Review of Systems  Constitutional: Positive for fatigue. Negative for fever.  HENT: Positive for ear pain (left ear). Negative for congestion, postnasal drip and sore throat.        Unable to raise left side of mouth when talking and smiling  Eyes:       Unable to fully close left eye  Respiratory: Negative.   Cardiovascular: Negative.   Gastrointestinal: Positive for nausea. Negative for constipation and diarrhea.  Genitourinary: Negative.   Musculoskeletal: Negative.   Skin: Positive for rash (left ear).   Neurological: Positive for dizziness. Negative for headaches.  Psychiatric/Behavioral: Negative.       Objective:    Physical Exam Vitals and nursing note reviewed.  Constitutional:      General: She is not in acute distress.    Appearance: Normal appearance.  HENT:     Head: Normocephalic and atraumatic.     Left Ear: Tympanic membrane normal.     Ears:     Comments: Scabbing shingles lesions to left external ear and into external area of ear canal Eyes:     Conjunctiva/sclera: Conjunctivae normal.  Neck:     Vascular: No carotid bruit.  Cardiovascular:  Rate and Rhythm: Normal rate and regular rhythm.     Pulses: Normal pulses.     Heart sounds: Normal heart sounds.  Pulmonary:     Effort: Pulmonary effort is normal.     Breath sounds: Normal breath sounds.  Musculoskeletal:     Cervical back: Normal range of motion.  Skin:    General: Skin is warm and dry.     Comments: Scabbing shingles lesions noted to left external ear and external ear canal  Neurological:     General: No focal deficit present.     Mental Status: She is alert and oriented to person, place, and time.     Cranial Nerves: Cranial nerve deficit (unable to smile and blink of left side of face) present.  Psychiatric:        Mood and Affect: Mood normal.        Behavior: Behavior normal.     BP 128/79   Pulse 71   Temp 98.6 F (37 C) (Oral)   Wt 181 lb 6.4 oz (82.3 kg)   SpO2 98%   BMI 33.34 kg/m  Wt Readings from Last 3 Encounters:  07/17/20 181 lb 6.4 oz (82.3 kg)  07/12/20 181 lb 12.8 oz (82.5 kg)  06/30/20 184 lb (83.5 kg)     Health Maintenance Due  Topic Date Due  . COVID-19 Vaccine (4 - Booster for Pfizer series) 04/24/2020    There are no preventive care reminders to display for this patient.  Lab Results  Component Value Date   TSH 1.390 07/12/2020   Lab Results  Component Value Date   WBC 11.4 (H) 07/12/2020   HGB 15.3 07/12/2020   HCT 46.7 (H) 07/12/2020   MCV  95 07/12/2020   PLT 280 07/12/2020   Lab Results  Component Value Date   NA 136 07/12/2020   K 4.4 07/12/2020   CO2 25 07/12/2020   GLUCOSE 98 07/12/2020   BUN 28 (H) 07/12/2020   CREATININE 0.87 07/12/2020   BILITOT 0.5 07/12/2020   ALKPHOS 148 (H) 07/12/2020   AST 21 07/12/2020   ALT 49 (H) 07/12/2020   PROT 7.0 07/12/2020   ALBUMIN 4.4 07/12/2020   CALCIUM 9.5 07/12/2020   ANIONGAP 6 07/04/2020   EGFR 72 07/12/2020   Lab Results  Component Value Date   CHOL 138 05/27/2019   Lab Results  Component Value Date   HDL 53 05/27/2019   Lab Results  Component Value Date   LDLCALC 66 05/27/2019   Lab Results  Component Value Date   TRIG 106 05/27/2019   Lab Results  Component Value Date   CHOLHDL 2.6 05/27/2019   Lab Results  Component Value Date   HGBA1C 5.9 07/12/2020      Assessment & Plan:   Problem List Items Addressed This Visit      Other   Herpes zoster with ophthalmic complication - Primary    Has been following routinely with opthalmology routinely and has follow-up appointment on Wednesday. Continue taping eye closed and placing drops per their recommendations. Shingles lesions on left ear seem to be healing at this point in time. Do not think she needs a referral to ENT at this time. The patient and daughter have not heard from neurology outpatient appointment yet, will check on this referral and also update to see if Dr. Raylene Miyamoto is taking new patients per family request. Will discontinue lyrica and go back to gabapentin as Tanny is having more side effects,  dizziness, and gait instability with lyrica. Will place referral to PT per family request. Answered all of the patient and family's questions. Follow-up in 2 weeks or sooner with any concerns.          Meds ordered this encounter  Medications  . gabapentin (NEURONTIN) 300 MG capsule    Sig: Take 1 capsule (300 mg total) by mouth 3 (three) times daily.    Dispense:  90 capsule    Refill:   1    Follow-up: Return in about 2 weeks (around 07/31/2020) for herpes zoster pain.   A total of 45 minutes were spent on this encounter today. When total time is documented, this includes both the face-to-face and non-face-to-face time personally spent before, during and after the visit on the date of the encounter. This included discussing medications, side effects, referrals to neurology and physical therapy, and answering 8 listed questions from the family.    Charyl Dancer, NP

## 2020-07-18 DIAGNOSIS — B0230 Zoster ocular disease, unspecified: Secondary | ICD-10-CM | POA: Insufficient documentation

## 2020-07-18 NOTE — Assessment & Plan Note (Signed)
Has been following routinely with opthalmology routinely and has follow-up appointment on Wednesday. Continue taping eye closed and placing drops per their recommendations. Shingles lesions on left ear seem to be healing at this point in time. Do not think she needs a referral to ENT at this time. The patient and daughter have not heard from neurology outpatient appointment yet, will check on this referral and also update to see if Dr. Raylene Miyamoto is taking new patients per family request. Will discontinue lyrica and go back to gabapentin as Abigail Delacruz is having more side effects, dizziness, and gait instability with lyrica. Will place referral to PT per family request. Answered all of the patient and family's questions. Follow-up in 2 weeks or sooner with any concerns.

## 2020-07-20 ENCOUNTER — Ambulatory Visit: Payer: Medicare HMO | Admitting: Internal Medicine

## 2020-07-25 ENCOUNTER — Ambulatory Visit: Payer: Self-pay | Admitting: *Deleted

## 2020-07-25 ENCOUNTER — Telehealth: Payer: Self-pay

## 2020-07-25 NOTE — Telephone Encounter (Signed)
Copied from [place] [id number]. Topic: General - Other >> Jul 25, 2020 10:43 AM Tessa Lerner A wrote: Reason for CRM: Patient would like to be contacted regarding their gabapentin (NEURONTIN) 300 MG capsule  prescription   The patient feels that the medication has caused dizziness and disorientation, these symptoms have increased within the last few days   The patient wishes to continue taking the medication but possibly a lesser dose  Please contact to further advise when possible   Would pt need apt for this?

## 2020-07-25 NOTE — Telephone Encounter (Signed)
Per initial encounter, "Patient has recently been prescribed gabapentin (NEURONTIN) 300 MG capsule; patient shares that they have started to experience dizziness and disorientation that they believe is a result of their medication; Patient has seen an increase in disorientation over the last few days and would like to discuss further when possible; contacted pt to discuss symptoms; the pt says her symptoms start 2 hours after taking her medication; sh estarted the medication on 07/17/20; she says her pain is decreased rated 1-2 out of 10, but she has dizziness and disorientation; the pt says she does not like the night she feels; she has decreased the medication to twice starting 07/25/19;  the pt was seen by Vance Peper on 07/17/20; the pt would like to know if her dose can be reduced or should she stop taking the medication; recommendations made per nurse triage protocol; the pt can be contacted at [phone]; will route to office for provider review; also see pt message dated 07/23/20.  Reason for Disposition . [1] Caller has URGENT medicine question about med that PCP or specialist prescribed AND [2] triager unable to answer question  Answer Assessment - Initial Assessment Questions 1. NAME of MEDICATION: "What medicine are you calling about?"     Gabapentin 300 mg TID prescribed 07/17/20 2. QUESTION: "What is your question?" (e.g., double dose of medicine, side effect)     Can dose of medication be reduced 3. PRESCRIBING HCP: "Who prescribed it?" Reason: if prescribed by specialist, call should be referred to that group.     Lauren McElwee,NP 4. SYMPTOMS: "Do you have any symptoms?"     Dizziness, disoriented 5. SEVERITY: If symptoms are present, ask "Are they mild, moderate or severe?"     severe 6. PREGNANCY:  "Is there any chance that you are pregnant?" "When was your last menstrual period?"     no  Protocols used: MEDICATION QUESTION CALL-A-AH

## 2020-07-25 NOTE — Telephone Encounter (Signed)
Appt scheduled

## 2020-07-25 NOTE — Progress Notes (Signed)
There were no vitals taken for this visit.   Subjective:    Patient ID: Abigail Delacruz, female    DOB: [birth year], 70 y.o.   MRN: [id number]  HPI: Abigail Delacruz is a 70 y.o. female  Chief Complaint  Patient presents with  . Medication Reaction    Patient states that she has been getting very dizzy when taking the gabapentin 3x daily.  She states that sent a my chart message on 07/24/19 with her symptoms in it.  For the last couple days she has only been one or two a day depending on the amount of pain.    MEDICATION ISSUE Patient states she is getting very dizzy and nausea with taking the medication three times daily.  Patient has been taking the gabapentin at 1-2 times daily since then.  Patient states she is no longer having the pain in her ear.  She states her pain is a 1 and does not need the medication.  Patient states her ear is healing well. Denies other concerns at visit today.    Relevant past medical, surgical, family and social history reviewed and updated as indicated. Interim medical history since our last visit reviewed. Allergies and medications reviewed and updated.  Review of Systems  Gastrointestinal: Positive for nausea.  Neurological: Positive for dizziness.    Per HPI unless specifically indicated above     Objective:    There were no vitals taken for this visit.  Wt Readings from Last 3 Encounters:  07/17/20 181 lb 6.4 oz (82.3 kg)  07/12/20 181 lb 12.8 oz (82.5 kg)  06/30/20 184 lb (83.5 kg)    Physical Exam Vitals and nursing note reviewed.  HENT:     Head: Normocephalic.     Right Ear: Hearing normal.     Left Ear: Hearing normal.     Nose: Nose normal.  Eyes:     Pupils: Pupils are equal, round, and reactive to light.  Pulmonary:     Effort: Pulmonary effort is normal. No respiratory distress.  Neurological:     Mental Status: She is alert.  Psychiatric:        Mood and Affect: Mood normal.        Behavior: Behavior normal.         Thought Content: Thought content normal.        Judgment: Judgment normal.     Results for orders placed or performed in visit on 07/12/20  Microscopic Examination   BLD  Result Value Ref Range   WBC, UA 0-5 0 - 5 /hpf   RBC None seen 0 - 2 /hpf   Epithelial Cells (non renal) 0-10 0 - 10 /hpf   Mucus, UA Present (A) Not Estab.   Bacteria, UA Few (A) None seen/Few  Urine Culture   Specimen: Urine   UR  Result Value Ref Range   Urine Culture, Routine Final report    Organism ID, Bacteria No growth   Bayer DCA Hb A1c Waived (STAT)  Result Value Ref Range   HB A1C (BAYER DCA - WAIVED) 5.9 <7.0 %  CBC with Differential/Platelet  Result Value Ref Range   WBC 11.4 (H) 3.4 - 10.8 x10E3/uL   RBC 4.93 3.77 - 5.28 x10E6/uL   Hemoglobin 15.3 11.1 - 15.9 g/dL   Hematocrit 46.7 (H) 34.0 - 46.6 %   MCV 95 79 - 97 fL   MCH 31.0 26.6 - 33.0 pg   MCHC 32.8 31.5 - 35.7 g/dL  RDW 13.8 11.7 - 15.4 %   Platelets 280 150 - 450 x10E3/uL   Neutrophils 75 Not Estab. %   Lymphs 17 Not Estab. %   Monocytes 6 Not Estab. %   Eos 2 Not Estab. %   Basos 0 Not Estab. %   Neutrophils Absolute 8.6 (H) 1.4 - 7.0 x10E3/uL   Lymphocytes Absolute 1.9 0.7 - 3.1 x10E3/uL   Monocytes Absolute 0.7 0.1 - 0.9 x10E3/uL   EOS (ABSOLUTE) 0.2 0.0 - 0.4 x10E3/uL   Basophils Absolute 0.0 0.0 - 0.2 x10E3/uL   Immature Granulocytes 0 Not Estab. %   Immature Grans (Abs) 0.0 0.0 - 0.1 x10E3/uL  Comprehensive metabolic panel  Result Value Ref Range   Glucose 98 65 - 99 mg/dL   BUN 28 (H) 8 - 27 mg/dL   Creatinine, Ser 0.87 0.57 - 1.00 mg/dL   eGFR 72 >59 mL/min/1.73   BUN/Creatinine Ratio 32 (H) 12 - 28   Sodium 136 134 - 144 mmol/L   Potassium 4.4 3.5 - 5.2 mmol/L   Chloride 92 (L) 96 - 106 mmol/L   CO2 25 20 - 29 mmol/L   Calcium 9.5 8.7 - 10.3 mg/dL   Total Protein 7.0 6.0 - 8.5 g/dL   Albumin 4.4 3.8 - 4.8 g/dL   Globulin, Total 2.6 1.5 - 4.5 g/dL   Albumin/Globulin Ratio 1.7 1.2 - 2.2   Bilirubin Total  0.5 0.0 - 1.2 mg/dL   Alkaline Phosphatase 148 (H) 44 - 121 IU/L   AST 21 0 - 40 IU/L   ALT 49 (H) 0 - 32 IU/L  TSH  Result Value Ref Range   TSH 1.390 0.450 - 4.500 uIU/mL  Urinalysis, Routine w reflex microscopic  Result Value Ref Range   Specific Gravity, UA 1.020 1.005 - 1.030   pH, UA 7.0 5.0 - 7.5   Color, UA Yellow Yellow   Appearance Ur Cloudy (A) Clear   Leukocytes,UA Trace (A) Negative   Protein,UA Trace (A) Negative/Trace   Glucose, UA Negative Negative   Ketones, UA Negative Negative   RBC, UA Negative Negative   Bilirubin, UA Negative Negative   Urobilinogen, Ur 0.2 0.2 - 1.0 mg/dL   Nitrite, UA Negative Negative   Microscopic Examination See below:       Assessment & Plan:   Problem List Items Addressed This Visit      Other   Herpes zoster with ophthalmic complication - Primary    Can stop Gabapentin and use OTC pain medication if necessary. Return to clinic if symptoms worsen.  Patient has already seen Ophthalmologist who is having her patch the eye due to the lid lagging.  She has appointment set up with PT and Neurology.           Follow up plan: Return if symptoms worsen or fail to improve.     This visit was completed via MyChart due to the restrictions of the COVID-19 pandemic. All issues as above were discussed and addressed. Physical exam was done as above through visual confirmation on MyChart. If it was felt that the patient should be evaluated in the office, they were directed there. The patient verbally consented to this visit. 1. Location of the patient: Home 2. Location of the provider: Office 3. Those involved with this call:  ? Provider: Jon Billings, NP ? CMA: Tiffany Reel, CMA ? Front Desk/Registration: [facility] 4. Time spent on call: 15 minutes with patient face to face via video conference. More than  50% of this time was spent in counseling and coordination of care. 20 minutes total spent in review of patient's record and  preparation of their chart.

## 2020-07-26 ENCOUNTER — Encounter: Payer: Self-pay | Admitting: Nurse Practitioner

## 2020-07-26 ENCOUNTER — Telehealth: Payer: Self-pay

## 2020-07-26 ENCOUNTER — Telehealth (INDEPENDENT_AMBULATORY_CARE_PROVIDER_SITE_OTHER): Payer: Medicare HMO | Admitting: Nurse Practitioner

## 2020-07-26 DIAGNOSIS — B0230 Zoster ocular disease, unspecified: Secondary | ICD-10-CM | POA: Diagnosis not present

## 2020-07-26 NOTE — Telephone Encounter (Signed)
Pt has apt on 07/26/2020 with Jon Billings NP

## 2020-07-26 NOTE — Assessment & Plan Note (Signed)
Can stop Gabapentin and use OTC pain medication if necessary. Return to clinic if symptoms worsen.  Patient has already seen Ophthalmologist who is having her patch the eye due to the lid lagging.  She has appointment set up with PT and Neurology.

## 2020-07-26 NOTE — Telephone Encounter (Signed)
LVM TO ASK ABOUT HER INS BEFORE THE [facility]

## 2020-07-31 ENCOUNTER — Ambulatory Visit: Payer: Medicare HMO | Admitting: Internal Medicine

## 2020-08-01 ENCOUNTER — Ambulatory Visit: Payer: Medicare HMO

## 2020-08-04 ENCOUNTER — Ambulatory Visit: Payer: Medicare HMO

## 2020-08-07 ENCOUNTER — Ambulatory Visit (INDEPENDENT_AMBULATORY_CARE_PROVIDER_SITE_OTHER): Payer: Medicare HMO

## 2020-08-07 VITALS — Ht 62.0 in | Wt 184.0 lb

## 2020-08-07 DIAGNOSIS — Z0000 Encounter for general adult medical examination without abnormal findings: Secondary | ICD-10-CM

## 2020-08-07 NOTE — Progress Notes (Signed)
I connected with Abigail Delacruz today by telephone and verified that I am speaking with the correct person using two identifiers. Location patient: home Location provider: work Persons participating in the virtual visit: Jora, Galluzzo LPN.   I discussed the limitations, risks, security and privacy concerns of performing an evaluation and management service by telephone and the availability of in person appointments. I also discussed with the patient that there may be a patient responsible charge related to this service. The patient expressed understanding and verbally consented to this telephonic visit.    Interactive audio and video telecommunications were attempted between this provider and patient, however failed, due to patient having technical difficulties OR patient did not have access to video capability.  We continued and completed visit with audio only.     Vital signs may be patient reported or missing.  Subjective:   Abigail Delacruz is a 70 y.o. female who presents for Medicare Annual (Subsequent) preventive examination.  Review of Systems     Cardiac Risk Factors include: advanced age (>37men, >87 women);dyslipidemia;obesity (BMI >30kg/m2);sedentary lifestyle     Objective:    Today's Vitals   08/07/20 1512  Weight: 184 lb (83.5 kg)  Height: 5\' 2"  (1.575 m)   Body mass index is 33.65 kg/m.  Advanced Directives 08/07/2020 06/30/2020 07/27/2019 10/20/2017 09/29/2017 09/08/2017  Does Patient Have a Medical Advance Directive? No No No No Yes No  Type of Advance Directive - - - - Press photographer -  Would patient like information on creating a medical advance directive? - No - Patient declined Yes (MAU/Ambulatory/Procedural Areas - Information given) No - Patient declined No - Patient declined No - Patient declined    Current Medications (verified) Outpatient Encounter Medications as of 08/07/2020  Medication Sig   atorvastatin (LIPITOR) 20 MG  tablet Take 1 tablet (20 mg total) by mouth daily.   Multiple Vitamins-Minerals (MULTIVITAMIN ADULT) CHEW Chew by mouth.   White Petrolatum-Mineral Oil (STYE) 31.9-57.7 % OINT Apply to eye.   gabapentin (NEURONTIN) 300 MG capsule Take 1 capsule (300 mg total) by mouth 3 (three) times daily. (Patient not taking: Reported on 08/07/2020)   omeprazole (PRILOSEC) 20 MG capsule Take 1 capsule (20 mg total) by mouth daily for 14 days.   ondansetron (ZOFRAN) 8 MG tablet Take 1 tablet (8 mg total) by mouth every 8 (eight) hours as needed for nausea or vomiting. (Patient not taking: Reported on 08/07/2020)   No facility-administered encounter medications on file as of 08/07/2020.    Allergies (verified) Ciprodex [ciprofloxacin-dexamethasone]   History: Past Medical History:  Diagnosis Date   Cataract    Hyperlipidemia    Past Surgical History:  Procedure Laterality Date   APPENDECTOMY  1971   CATARACT EXTRACTION W/PHACO Right 09/08/2017   Procedure: CATARACT EXTRACTION PHACO AND INTRAOCULAR LENS PLACEMENT ([place])  RIGHT;  Surgeon: Eulogio Bear, MD;  Location: [facility];  Service: Ophthalmology;  Laterality: Right;   CATARACT EXTRACTION W/PHACO Left 09/29/2017   Procedure: CATARACT EXTRACTION PHACO AND INTRAOCULAR LENS PLACEMENT ([place]) LEFT;  Surgeon: Eulogio Bear, MD;  Location: [facility];  Service: Ophthalmology;  Laterality: Left;   COLONOSCOPY WITH PROPOFOL N/A 10/20/2017   Procedure: COLONOSCOPY WITH PROPOFOL;  Surgeon: Lucilla Lame, MD;  Location: [facility];  Service: Endoscopy;  Laterality: N/A;   Family History  Problem Relation Age of Onset   Kidney failure Mother    Cirrhosis Father    Alcohol abuse Father  Diabetes Brother    Hypertension Brother    Arthritis Brother    Breast cancer Neg Hx    Social History   Socioeconomic History   Marital status: Married    Spouse name: Not on file   Number of children: 2   Years of education: Not on  file   Highest education level: Some college, no degree  Occupational History   Not on file  Tobacco Use   Smoking status: Former    Pack years: 0.00    Types: Cigarettes    Quit date: 1974    Years since quitting: 48.4   Smokeless tobacco: Never  Vaping Use   Vaping Use: Never used  Substance and Sexual Activity   Alcohol use: Yes    Comment: social 1x/yr   Drug use: Never   Sexual activity: Not Currently  Other Topics Concern   Not on file  Social History Narrative   Not on file   Social Determinants of Health   Financial Resource Strain: Low Risk    Difficulty of Paying Living Expenses: Not hard at all  Food Insecurity: No Food Insecurity   Worried About Charity fundraiser in the Last Year: Never true   [place] in the Last Year: Never true  Transportation Needs: No Transportation Needs   Lack of Transportation (Medical): No   Lack of Transportation (Non-Medical): No  Physical Activity: Inactive   Days of Exercise per Week: 0 days   Minutes of Exercise per Session: 0 min  Stress: No Stress Concern Present   Feeling of Stress : Not at all  Social Connections: Not on file    Tobacco Counseling Counseling given: Not Answered   Clinical Intake:  Pre-visit preparation completed: Yes  Pain : No/denies pain     Nutritional Status: BMI > 30  Obese Nutritional Risks: None Diabetes: No  How often do you need to have someone help you when you read instructions, pamphlets, or other written materials from your doctor or pharmacy?: 1 - Never What is the last grade level you completed in school?: 35yrs college  Diabetic? no  Interpreter Needed?: No  Information entered by :: NAllen LPN   Activities of Daily Living In your present state of health, do you have any difficulty performing the following activities: 08/07/2020 06/30/2020  Hearing? N N  Vision? Y N  Comment a little blurry sometimes -  Difficulty concentrating or making decisions? N N  Walking  or climbing stairs? Y N  Comment dizziness -  Dressing or bathing? N N  Doing errands, shopping? Y N  Comment no driving Facilities manager and eating ? Y -  Comment daughter managing -  Using the Toilet? N -  In the past six months, have you accidently leaked urine? N -  Do you have problems with loss of bowel control? N -  Managing your Medications? N -  Managing your Finances? N -  Housekeeping or managing your Housekeeping? N -  Some recent data might be hidden    Patient Care Team: Charlynne Cousins, MD as PCP - General (Internal Medicine)  Indicate any recent Medical Services you may have received from other than [facility] providers in the past year (date may be approximate).     Assessment:   This is a routine wellness examination for Seretha.  Hearing/Vision screen Vision Screening - Comments:: Regular eye exams, [facility]  Dietary issues and exercise activities discussed: Current Exercise Habits: The  patient does not participate in regular exercise at present   Goals Addressed             This Visit's Progress    Patient Stated       08/07/2020, just wants to get better        Depression Screen PHQ 2/9 Scores 08/07/2020 07/12/2020 06/29/2020 06/26/2020 07/27/2019 05/27/2019 08/26/2017  PHQ - 2 Score 3 3 0 0 0 0 0  PHQ- 9 Score 6 3 0 0 - - 0    Fall Risk Fall Risk  08/07/2020 07/12/2020 06/29/2020 06/26/2020 07/27/2019  Falls in the past year? 0 0 0 0 0  Number falls in past yr: - 0 0 0 0  Injury with Fall? - 0 - 0 0  Risk for fall due to : Medication side effect - No Fall Risks No Fall Risks -  Follow up Falls evaluation completed;Education provided;Falls prevention discussed Falls evaluation completed Falls evaluation completed Falls evaluation completed -    FALL RISK PREVENTION PERTAINING TO THE HOME:  Any stairs in or around the home? Yes  If so, are there any without handrails? Yes  Home free of loose throw rugs in walkways, pet beds, electrical cords, etc? Yes   Adequate lighting in your home to reduce risk of falls? Yes   ASSISTIVE DEVICES UTILIZED TO PREVENT FALLS:  Life alert? No  Use of a cane, walker or w/c? No  Grab bars in the bathroom? No  Shower chair or bench in shower? No  Elevated toilet seat or a handicapped toilet? No   TIMED UP AND GO:  Was the test performed? No .      Cognitive Function:     6CIT Screen 08/07/2020 07/27/2019  What Year? 0 points 0 points  What month? 0 points 0 points  What time? 0 points 0 points  Count back from 20 0 points 0 points  Months in reverse 0 points 0 points  Repeat phrase 0 points 0 points  Total Score 0 0    Immunizations Immunization History  Administered Date(s) Administered   PFIZER(Purple Top)SARS-COV-2 Vaccination 06/01/2019, 06/22/2019, 01/24/2020   Pneumococcal Conjugate-13 08/26/2017   Pneumococcal Polysaccharide-23 09/18/2018    TDAP status: Due, Education has been provided regarding the importance of this vaccine. Advised may receive this vaccine at local pharmacy or Health Dept. Aware to provide a copy of the vaccination record if obtained from local pharmacy or Health Dept. Verbalized acceptance and understanding.  Flu Vaccine status: Declined, Education has been provided regarding the importance of this vaccine but patient still declined. Advised may receive this vaccine at local pharmacy or Health Dept. Aware to provide a copy of the vaccination record if obtained from local pharmacy or Health Dept. Verbalized acceptance and understanding.  Pneumococcal vaccine status: Up to date  Covid-19 vaccine status: Completed vaccines  Qualifies for Shingles Vaccine? Yes   Zostavax completed No   Shingrix Completed?: No.    Education has been provided regarding the importance of this vaccine. Patient has been advised to call insurance company to determine out of pocket expense if they have not yet received this vaccine. Advised may also receive vaccine at local pharmacy or  Health Dept. Verbalized acceptance and understanding.  Screening Tests Health Maintenance  Topic Date Due   TETANUS/TDAP  Never done   Zoster Vaccines- Shingrix (1 of 2) Never done   COVID-19 Vaccine (4 - Booster for Pfizer series) 04/24/2020   INFLUENZA VACCINE  09/25/2020   MAMMOGRAM  09/26/2021   COLONOSCOPY (Pts 45-46yrs Insurance coverage will need to be confirmed)  10/21/2027   DEXA SCAN  Completed   Hepatitis C Screening  Completed   PNA vac Low Risk Adult  Completed   HPV VACCINES  Aged Out    Health Maintenance  Health Maintenance Due  Topic Date Due   TETANUS/TDAP  Never done   Zoster Vaccines- Shingrix (1 of 2) Never done   COVID-19 Vaccine (4 - Booster for Pfizer series) 04/24/2020    Colorectal cancer screening: Type of screening: Colonoscopy. Completed 10/20/2017. Repeat every 10 years  Mammogram status: Completed 09/27/2019. Repeat every year  Bone Density status: Completed 09/22/2017.  Lung Cancer Screening: (Low Dose CT Chest recommended if Age 80-80 years, 30 pack-year currently smoking OR have quit w/in 15years.) does not qualify.   Lung Cancer Screening Referral: no  Additional Screening:  Hepatitis C Screening: does qualify; Completed 09/18/2018  Vision Screening: Recommended annual ophthalmology exams for early detection of glaucoma and other disorders of the eye. Is the patient up to date with their annual eye exam?  Yes  Who is the provider or what is the name of the office in which the patient attends annual eye exams? [facility] If pt is not established with a provider, would they like to be referred to a provider to establish care? No .   Dental Screening: Recommended annual dental exams for proper oral hygiene  Community Resource Referral / Chronic Care Management: CRR required this visit?  No   CCM required this visit?  No      Plan:     I have personally reviewed and noted the following in the patient's chart:   Medical and  social history Use of alcohol, tobacco or illicit drugs  Current medications and supplements including opioid prescriptions.  Functional ability and status Nutritional status Physical activity Advanced directives List of other physicians Hospitalizations, surgeries, and ER visits in previous 12 months Vitals Screenings to include cognitive, depression, and falls Referrals and appointments  In addition, I have reviewed and discussed with patient certain preventive protocols, quality metrics, and best practice recommendations. A written personalized care plan for preventive services as well as general preventive health recommendations were provided to patient.     Kellie Simmering, LPN   [id number]   Nurse Notes:

## 2020-08-07 NOTE — Patient Instructions (Signed)
Abigail Delacruz , Thank you for taking time to come for your Medicare Wellness Visit. I appreciate your ongoing commitment to your health goals. Please review the following plan we discussed and let me know if I can assist you in the future.   Screening recommendations/referrals: Colonoscopy: completed 10/20/2017 Mammogram: completed 09/27/2019 Bone Density: completed 09/22/2017 Recommended yearly ophthalmology/optometry visit for glaucoma screening and checkup Recommended yearly dental visit for hygiene and checkup  Vaccinations: Influenza vaccine: decline Pneumococcal vaccine: completed 09/18/2018 Tdap vaccine: decline Shingles vaccine: discussed   Covid-19: 01/24/2020, 06/22/2019, 06/01/2019  Advanced directives: Advance directive discussed with you today.   Conditions/risks identified: none  Next appointment: Follow up in one year for your annual wellness visit    Preventive Care 65 Years and Older, Female Preventive care refers to lifestyle choices and visits with your health care provider that can promote health and wellness. What does preventive care include? A yearly physical exam. This is also called an annual well check. Dental exams once or twice a year. Routine eye exams. Ask your health care provider how often you should have your eyes checked. Personal lifestyle choices, including: Daily care of your teeth and gums. Regular physical activity. Eating a healthy diet. Avoiding tobacco and drug use. Limiting alcohol use. Practicing safe sex. Taking low-dose aspirin every day. Taking vitamin and mineral supplements as recommended by your health care provider. What happens during an annual well check? The services and screenings done by your health care provider during your annual well check will depend on your age, overall health, lifestyle risk factors, and family history of disease. Counseling  Your health care provider may ask you questions about your: Alcohol use. Tobacco  use. Drug use. Emotional well-being. Home and relationship well-being. Sexual activity. Eating habits. History of falls. Memory and ability to understand (cognition). Work and work Statistician. Reproductive health. Screening  You may have the following tests or measurements: Height, weight, and BMI. Blood pressure. Lipid and cholesterol levels. These may be checked every 5 years, or more frequently if you are over 64 years old. Skin check. Lung cancer screening. You may have this screening every year starting at age 41 if you have a 30-pack-year history of smoking and currently smoke or have quit within the past 15 years. Fecal occult blood test (FOBT) of the stool. You may have this test every year starting at age 63. Flexible sigmoidoscopy or colonoscopy. You may have a sigmoidoscopy every 5 years or a colonoscopy every 10 years starting at age 60. Hepatitis C blood test. Hepatitis B blood test. Sexually transmitted disease (STD) testing. Diabetes screening. This is done by checking your blood sugar (glucose) after you have not eaten for a while (fasting). You may have this done every 1-3 years. Bone density scan. This is done to screen for osteoporosis. You may have this done starting at age 27. Mammogram. This may be done every 1-2 years. Talk to your health care provider about how often you should have regular mammograms. Talk with your health care provider about your test results, treatment options, and if necessary, the need for more tests. Vaccines  Your health care provider may recommend certain vaccines, such as: Influenza vaccine. This is recommended every year. Tetanus, diphtheria, and acellular pertussis (Tdap, Td) vaccine. You may need a Td booster every 10 years. Zoster vaccine. You may need this after age 48. Pneumococcal 13-valent conjugate (PCV13) vaccine. One dose is recommended after age 88. Pneumococcal polysaccharide (PPSV23) vaccine. One dose is recommended  after  age 12. Talk to your health care provider about which screenings and vaccines you need and how often you need them. This information is not intended to replace advice given to you by your health care provider. Make sure you discuss any questions you have with your health care provider. Document Released: 03/10/2015 Document Revised: 11/01/2015 Document Reviewed: 12/13/2014 Elsevier Interactive Patient Education  2017 [place] Prevention in the Home Falls can cause injuries. They can happen to people of all ages. There are many things you can do to make your home safe and to help prevent falls. What can I do on the outside of my home? Regularly fix the edges of walkways and driveways and fix any cracks. Remove anything that might make you trip as you walk through a door, such as a raised step or threshold. Trim any bushes or trees on the path to your home. Use bright outdoor lighting. Clear any walking paths of anything that might make someone trip, such as rocks or tools. Regularly check to see if handrails are loose or broken. Make sure that both sides of any steps have handrails. Any raised decks and porches should have guardrails on the edges. Have any leaves, snow, or ice cleared regularly. Use sand or salt on walking paths during winter. Clean up any spills in your garage right away. This includes oil or grease spills. What can I do in the bathroom? Use night lights. Install grab bars by the toilet and in the tub and shower. Do not use towel bars as grab bars. Use non-skid mats or decals in the tub or shower. If you need to sit down in the shower, use a plastic, non-slip stool. Keep the floor dry. Clean up any water that spills on the floor as soon as it happens. Remove soap buildup in the tub or shower regularly. Attach bath mats securely with double-sided non-slip rug tape. Do not have throw rugs and other things on the floor that can make you trip. What can I do  in the bedroom? Use night lights. Make sure that you have a light by your bed that is easy to reach. Do not use any sheets or blankets that are too big for your bed. They should not hang down onto the floor. Have a firm chair that has side arms. You can use this for support while you get dressed. Do not have throw rugs and other things on the floor that can make you trip. What can I do in the kitchen? Clean up any spills right away. Avoid walking on wet floors. Keep items that you use a lot in easy-to-reach places. If you need to reach something above you, use a strong step stool that has a grab bar. Keep electrical cords out of the way. Do not use floor polish or wax that makes floors slippery. If you must use wax, use non-skid floor wax. Do not have throw rugs and other things on the floor that can make you trip. What can I do with my stairs? Do not leave any items on the stairs. Make sure that there are handrails on both sides of the stairs and use them. Fix handrails that are broken or loose. Make sure that handrails are as long as the stairways. Check any carpeting to make sure that it is firmly attached to the stairs. Fix any carpet that is loose or worn. Avoid having throw rugs at the top or bottom of the stairs. If you do have  throw rugs, attach them to the floor with carpet tape. Make sure that you have a light switch at the top of the stairs and the bottom of the stairs. If you do not have them, ask someone to add them for you. What else can I do to help prevent falls? Wear shoes that: Do not have high heels. Have rubber bottoms. Are comfortable and fit you well. Are closed at the toe. Do not wear sandals. If you use a stepladder: Make sure that it is fully opened. Do not climb a closed stepladder. Make sure that both sides of the stepladder are locked into place. Ask someone to hold it for you, if possible. Clearly mark and make sure that you can see: Any grab bars or  handrails. First and last steps. Where the edge of each step is. Use tools that help you move around (mobility aids) if they are needed. These include: Canes. Walkers. Scooters. Crutches. Turn on the lights when you go into a dark area. Replace any light bulbs as soon as they burn out. Set up your furniture so you have a clear path. Avoid moving your furniture around. If any of your floors are uneven, fix them. If there are any pets around you, be aware of where they are. Review your medicines with your doctor. Some medicines can make you feel dizzy. This can increase your chance of falling. Ask your doctor what other things that you can do to help prevent falls. This information is not intended to replace advice given to you by your health care provider. Make sure you discuss any questions you have with your health care provider. Document Released: 12/08/2008 Document Revised: 07/20/2015 Document Reviewed: 03/18/2014 Elsevier Interactive Patient Education  2017 [employer].

## 2020-08-24 NOTE — Telephone Encounter (Signed)
Needs appt thnx. Virtual please thx

## 2020-08-26 ENCOUNTER — Other Ambulatory Visit: Payer: Self-pay | Admitting: Family Medicine

## 2020-08-26 DIAGNOSIS — E782 Mixed hyperlipidemia: Secondary | ICD-10-CM

## 2020-08-27 NOTE — Telephone Encounter (Signed)
Requested medication (s) are due for refill today: yes  Requested medication (s) are on the active medication list: yes  Last refill:  05/26/20  Future visit scheduled: no  Notes to clinic:  overdue lab work   Requested Prescriptions  Pending Prescriptions Disp Refills   atorvastatin (LIPITOR) 20 MG tablet [Pharmacy Med Name: Atorvastatin Calcium 20 MG Oral Tablet] 90 tablet 0    Sig: Take 1 tablet by mouth once daily      Cardiovascular:  Antilipid - Statins Failed - 08/26/2020  6:33 PM      Failed - Total Cholesterol in normal range and within 360 days    Cholesterol  Date Value Ref Range Status  05/27/2019 138 <200 mg/dL Final          Failed - LDL in normal range and within 360 days    LDL Cholesterol (Calc)  Date Value Ref Range Status  05/27/2019 66 mg/dL (calc) Final    Comment:    Reference range: <100 . Desirable range <100 mg/dL for primary prevention;   <70 mg/dL for patients with CHD or diabetic patients  with > or = 2 CHD risk factors. Marland Kitchen LDL-C is now calculated using the Martin-Hopkins  calculation, which is a validated novel method providing  better accuracy than the Friedewald equation in the  estimation of LDL-C.  Cresenciano Genre et al. Annamaria Helling. 8127;517(00): 2061-2068  (http://education.[facility].com/faq/FAQ164)           Failed - HDL in normal range and within 360 days    HDL  Date Value Ref Range Status  05/27/2019 53 > OR = 50 mg/dL Final          Failed - Triglycerides in normal range and within 360 days    Triglycerides  Date Value Ref Range Status  05/27/2019 106 <150 mg/dL Final          Passed - Patient is not pregnant      Passed - Valid encounter within last 12 months    Recent Outpatient Visits           1 month ago Herpes zoster with ophthalmic complication, unspecified herpes zoster eye disease   [facility] Jon Billings, NP   1 month ago Herpes zoster with ophthalmic complication, unspecified herpes zoster  eye disease   [facility] McElwee, Lauren A, NP   1 month ago Herpes zoster with ophthalmic complication, unspecified herpes zoster eye disease   [facility] Vigg, Avanti, MD   1 month ago [name]   [facility], Coralie Keens, NP   2 months ago Otalgia, left   [facility] [place], Coralie Keens, NP       Future Appointments             In 59 months [facility], [facility]

## 2020-09-07 ENCOUNTER — Other Ambulatory Visit: Payer: Self-pay | Admitting: Internal Medicine

## 2020-09-07 ENCOUNTER — Other Ambulatory Visit: Payer: Self-pay | Admitting: Nurse Practitioner

## 2020-09-07 DIAGNOSIS — Z1231 Encounter for screening mammogram for malignant neoplasm of breast: Secondary | ICD-10-CM

## 2020-09-07 DIAGNOSIS — E782 Mixed hyperlipidemia: Secondary | ICD-10-CM

## 2020-09-07 NOTE — Telephone Encounter (Signed)
Copied from [place] [id number]. Topic: Quick Communication - Rx Refill/Question >> Sep 07, 2020 11:47 AM Yvette Rack wrote: Medication: atorvastatin (LIPITOR) 20 MG tablet  Has the patient contacted their pharmacy? No. Pt switched from [place] to [place]  (Agent: If no, request that the patient contact the pharmacy for the refill.) (Agent: If yes, when and what did the pharmacy advise?)  Preferred Pharmacy (with phone number or street name): [facility] [place]  Phone: [phone]   Fax: [phone]  Agent: Please be advised that RX refills may take up to 3 business days. We ask that you follow-up with your pharmacy.

## 2020-09-07 NOTE — Telephone Encounter (Signed)
   Notes to clinic:  Medication was filled by old provider  Review for refill    Requested Prescriptions  Pending Prescriptions Disp Refills   atorvastatin (LIPITOR) 20 MG tablet 90 tablet 0    Sig: Take 1 tablet (20 mg total) by mouth daily.      Cardiovascular:  Antilipid - Statins Failed - 09/07/2020 11:50 AM      Failed - Total Cholesterol in normal range and within 360 days    Cholesterol  Date Value Ref Range Status  05/27/2019 138 <200 mg/dL Final          Failed - LDL in normal range and within 360 days    LDL Cholesterol (Calc)  Date Value Ref Range Status  05/27/2019 66 mg/dL (calc) Final    Comment:    Reference range: <100 . Desirable range <100 mg/dL for primary prevention;   <70 mg/dL for patients with CHD or diabetic patients  with > or = 2 CHD risk factors. Marland Kitchen LDL-C is now calculated using the Martin-Hopkins  calculation, which is a validated novel method providing  better accuracy than the Friedewald equation in the  estimation of LDL-C.  Cresenciano Genre et al. Annamaria Helling. 0814;481(85): 2061-2068  (http://education.[employer].com/faq/FAQ164)           Failed - HDL in normal range and within 360 days    HDL  Date Value Ref Range Status  05/27/2019 53 > OR = 50 mg/dL Final          Failed - Triglycerides in normal range and within 360 days    Triglycerides  Date Value Ref Range Status  05/27/2019 106 <150 mg/dL Final          Passed - Patient is not pregnant      Passed - Valid encounter within last 12 months    Recent Outpatient Visits           1 month ago Herpes zoster with ophthalmic complication, unspecified herpes zoster eye disease   [facility] Jon Billings, NP   1 month ago Herpes zoster with ophthalmic complication, unspecified herpes zoster eye disease   [facility] McElwee, Lauren A, NP   1 month ago Herpes zoster with ophthalmic complication, unspecified herpes zoster eye disease   [facility] Vigg, Avanti, MD   2 months ago [name]   [facility] [place], Coralie Keens, NP   2 months ago Otalgia, left   [facility] [place], Coralie Keens, NP       Future Appointments             In 78 months [facility], [facility]

## 2020-09-07 NOTE — Telephone Encounter (Signed)
Pt needs apt for this med refill

## 2020-09-12 NOTE — Telephone Encounter (Signed)
Pt has apt on 09/13/2020

## 2020-09-13 ENCOUNTER — Telehealth: Payer: Self-pay

## 2020-09-13 ENCOUNTER — Other Ambulatory Visit: Payer: Self-pay

## 2020-09-13 ENCOUNTER — Ambulatory Visit (INDEPENDENT_AMBULATORY_CARE_PROVIDER_SITE_OTHER): Payer: Medicare HMO | Admitting: Internal Medicine

## 2020-09-13 VITALS — BP 138/82 | HR 73 | Temp 98.9°F | Ht 62.09 in | Wt 181.8 lb

## 2020-09-13 DIAGNOSIS — G510 Bell's palsy: Secondary | ICD-10-CM | POA: Diagnosis not present

## 2020-09-13 MED ORDER — FEXOFENADINE HCL 180 MG PO TABS: 180.0000 mg | ORAL_TABLET | Freq: Every day | ORAL | 1 refills | Status: DC

## 2020-09-13 MED ORDER — MECLIZINE HCL 25 MG PO TABS: 25.0000 mg | ORAL_TABLET | Freq: Three times a day (TID) | ORAL | 0 refills | Status: DC | PRN

## 2020-09-13 NOTE — Telephone Encounter (Signed)
PA started for Meclizine HCI 25mg  tab through cover my meds, awaiting on determination

## 2020-09-13 NOTE — Progress Notes (Signed)
BP 138/82   Pulse 73   Temp 98.9 F (37.2 C) (Oral)   Ht 5' 2.09" (1.577 m)   Wt 181 lb 12.8 oz (82.5 kg)   SpO2 98%   BMI 33.16 kg/m    Subjective:    Patient ID: Abigail Delacruz, female    DOB: Oct 20, 1950, 70 y.o.   MRN: [id number]  Chief Complaint  Patient presents with  . Dizziness    Still having dizziness  . Bells Palsy    On left side of face, seems to be getting better.     HPI: MARELLY Delacruz is a 70 y.o. female  Had bells palsy on the left side, seen ophthalmology for such, has continued to have diziness -- saw neurology for this   Dizziness Chronicity: when she walks she feels dizzy and feels this in her head. went to see PT.   Chief Complaint  Patient presents with  . Dizziness    Still having dizziness  . Bells Palsy    On left side of face, seems to be getting better.     Relevant past medical, surgical, family and social history reviewed and updated as indicated. Interim medical history since our last visit reviewed. Allergies and medications reviewed and updated.  Review of Systems  Neurological:  Positive for dizziness.   Per HPI unless specifically indicated above     Objective:    BP 138/82   Pulse 73   Temp 98.9 F (37.2 C) (Oral)   Ht 5' 2.09" (1.577 m)   Wt 181 lb 12.8 oz (82.5 kg)   SpO2 98%   BMI 33.16 kg/m   Wt Readings from Last 3 Encounters:  09/13/20 181 lb 12.8 oz (82.5 kg)  08/07/20 184 lb (83.5 kg)  07/17/20 181 lb 6.4 oz (82.3 kg)    Physical Exam Vitals and nursing note reviewed.  Constitutional:      General: She is not in acute distress.    Appearance: Normal appearance. She is not ill-appearing or diaphoretic.  HENT:     Head: Normocephalic and atraumatic.     Right Ear: Tympanic membrane and external ear normal. There is no impacted cerumen.     Left Ear: External ear normal.     Ears:     Comments: Left ear with left over rash / dried up vesicle snoted.     Nose: No congestion or rhinorrhea.      Mouth/Throat:     Pharynx: No oropharyngeal exudate or posterior oropharyngeal erythema.  Eyes:     Conjunctiva/sclera: Conjunctivae normal.     Pupils: Pupils are equal, round, and reactive to light.  Cardiovascular:     Rate and Rhythm: Normal rate and regular rhythm.     Heart sounds: No murmur heard.   No friction rub. No gallop.  Pulmonary:     Effort: No respiratory distress.     Breath sounds: No stridor. No wheezing or rhonchi.  Chest:     Chest wall: No tenderness.  Abdominal:     General: Abdomen is flat. Bowel sounds are normal. There is no distension.     Palpations: Abdomen is soft. There is no mass.     Tenderness: There is no abdominal tenderness. There is no guarding.  Musculoskeletal:        General: No swelling or deformity.     Cervical back: Normal range of motion and neck supple. No rigidity or tenderness.     Right lower leg: No edema.  Left lower leg: No edema.  Skin:    General: Skin is warm and dry.     Coloration: Skin is not jaundiced.     Findings: No erythema.  Neurological:     Mental Status: She is alert and oriented to person, place, and time. Mental status is at baseline.  Psychiatric:        Mood and Affect: Mood normal.        Behavior: Behavior normal.        Thought Content: Thought content normal.        Judgment: Judgment normal.    Results for orders placed or performed in visit on 07/12/20  Microscopic Examination   BLD  Result Value Ref Range   WBC, UA 0-5 0 - 5 /hpf   RBC None seen 0 - 2 /hpf   Epithelial Cells (non renal) 0-10 0 - 10 /hpf   Mucus, UA Present (A) Not Estab.   Bacteria, UA Few (A) None seen/Few  Urine Culture   Specimen: Urine   UR  Result Value Ref Range   Urine Culture, Routine Final report    Organism ID, Bacteria No growth   Bayer DCA Hb A1c Waived (STAT)  Result Value Ref Range   HB A1C (BAYER DCA - WAIVED) 5.9 <7.0 %  CBC with Differential/Platelet  Result Value Ref Range   WBC 11.4 (H) 3.4 -  10.8 x10E3/uL   RBC 4.93 3.77 - 5.28 x10E6/uL   Hemoglobin 15.3 11.1 - 15.9 g/dL   Hematocrit 46.7 (H) 34.0 - 46.6 %   MCV 95 79 - 97 fL   MCH 31.0 26.6 - 33.0 pg   MCHC 32.8 31.5 - 35.7 g/dL   RDW 13.8 11.7 - 15.4 %   Platelets 280 150 - 450 x10E3/uL   Neutrophils 75 Not Estab. %   Lymphs 17 Not Estab. %   Monocytes 6 Not Estab. %   Eos 2 Not Estab. %   Basos 0 Not Estab. %   Neutrophils Absolute 8.6 (H) 1.4 - 7.0 x10E3/uL   Lymphocytes Absolute 1.9 0.7 - 3.1 x10E3/uL   Monocytes Absolute 0.7 0.1 - 0.9 x10E3/uL   EOS (ABSOLUTE) 0.2 0.0 - 0.4 x10E3/uL   Basophils Absolute 0.0 0.0 - 0.2 x10E3/uL   Immature Granulocytes 0 Not Estab. %   Immature Grans (Abs) 0.0 0.0 - 0.1 x10E3/uL  Comprehensive metabolic panel  Result Value Ref Range   Glucose 98 65 - 99 mg/dL   BUN 28 (H) 8 - 27 mg/dL   Creatinine, Ser 0.87 0.57 - 1.00 mg/dL   eGFR 72 >59 mL/min/1.73   BUN/Creatinine Ratio 32 (H) 12 - 28   Sodium 136 134 - 144 mmol/L   Potassium 4.4 3.5 - 5.2 mmol/L   Chloride 92 (L) 96 - 106 mmol/L   CO2 25 20 - 29 mmol/L   Calcium 9.5 8.7 - 10.3 mg/dL   Total Protein 7.0 6.0 - 8.5 g/dL   Albumin 4.4 3.8 - 4.8 g/dL   Globulin, Total 2.6 1.5 - 4.5 g/dL   Albumin/Globulin Ratio 1.7 1.2 - 2.2   Bilirubin Total 0.5 0.0 - 1.2 mg/dL   Alkaline Phosphatase 148 (H) 44 - 121 IU/L   AST 21 0 - 40 IU/L   ALT 49 (H) 0 - 32 IU/L  TSH  Result Value Ref Range   TSH 1.390 0.450 - 4.500 uIU/mL  Urinalysis, Routine w reflex microscopic  Result Value Ref Range   Specific Gravity, UA 1.020  1.005 - 1.030   pH, UA 7.0 5.0 - 7.5   Color, UA Yellow Yellow   Appearance Ur Cloudy (A) Clear   Leukocytes,UA Trace (A) Negative   Protein,UA Trace (A) Negative/Trace   Glucose, UA Negative Negative   Ketones, UA Negative Negative   RBC, UA Negative Negative   Bilirubin, UA Negative Negative   Urobilinogen, Ur 0.2 0.2 - 1.0 mg/dL   Nitrite, UA Negative Negative   Microscopic Examination See below:          Current Outpatient Medications:  Marland Kitchen  Multiple Vitamins-Minerals (MULTIVITAMIN ADULT) CHEW, Chew by mouth., Disp: , Rfl:  .  atorvastatin (LIPITOR) 20 MG tablet, Take 1 tablet by mouth once daily (Patient not taking: Reported on 09/13/2020), Disp: 90 tablet, Rfl: 0 .  omeprazole (PRILOSEC) 20 MG capsule, Take 1 capsule (20 mg total) by mouth daily for 14 days., Disp: 90 capsule, Rfl: 1 .  ondansetron (ZOFRAN) 8 MG tablet, Take 1 tablet (8 mg total) by mouth every 8 (eight) hours as needed for nausea or vomiting. (Patient not taking: No sig reported), Disp: 15 tablet, Rfl: 0 .  White Petrolatum-Mineral Oil (STYE) 31.9-57.7 % OINT, Apply to eye., Disp: , Rfl:     Assessment & Plan:  Left bells palsy sec to shingles.  And dizziness , Will need to recheck with neurology  A1c at 5.9.  Paralysis facial weakness better, rash improved except on her left ear.  To fu with neuro   2. HLD not been taking lipitor will recheck FLP, check LFT's work on diet, SE of meds explained to pt. low fat and high fiber diet explained to pt.   3. Dizziness to see neuro for such ? Sec to recnetl shingles.  Trial of  meclizine , sent in   Problem List Items Addressed This Visit   None    No orders of the defined types were placed in this encounter.    No orders of the defined types were placed in this encounter.    Follow up plan: No follow-ups on file.

## 2020-09-14 ENCOUNTER — Telehealth: Payer: Self-pay

## 2020-09-14 ENCOUNTER — Encounter: Payer: Self-pay | Admitting: Internal Medicine

## 2020-09-14 LAB — CBC WITH DIFFERENTIAL/PLATELET
Basophils Absolute: 0 10*3/uL (ref 0.0–0.2)
Basos: 1 %
EOS (ABSOLUTE): 0.1 10*3/uL (ref 0.0–0.4)
Eos: 1 %
Hematocrit: 44.9 % (ref 34.0–46.6)
Hemoglobin: 15.4 g/dL (ref 11.1–15.9)
Immature Grans (Abs): 0 10*3/uL (ref 0.0–0.1)
Immature Granulocytes: 0 %
Lymphocytes Absolute: 2 10*3/uL (ref 0.7–3.1)
Lymphs: 36 %
MCH: 31.6 pg (ref 26.6–33.0)
MCHC: 34.3 g/dL (ref 31.5–35.7)
MCV: 92 fL (ref 79–97)
Monocytes Absolute: 0.4 10*3/uL (ref 0.1–0.9)
Monocytes: 7 %
Neutrophils Absolute: 3 10*3/uL (ref 1.4–7.0)
Neutrophils: 55 %
Platelets: 265 10*3/uL (ref 150–450)
RBC: 4.87 x10E6/uL (ref 3.77–5.28)
RDW: 13.2 % (ref 11.7–15.4)
WBC: 5.6 10*3/uL (ref 3.4–10.8)

## 2020-09-14 LAB — COMPREHENSIVE METABOLIC PANEL
ALT: 26 IU/L (ref 0–32)
AST: 26 IU/L (ref 0–40)
Albumin/Globulin Ratio: 1.9 (ref 1.2–2.2)
Albumin: 4.8 g/dL (ref 3.8–4.8)
Alkaline Phosphatase: 156 IU/L — ABNORMAL HIGH (ref 44–121)
BUN/Creatinine Ratio: 25 (ref 12–28)
BUN: 17 mg/dL (ref 8–27)
Bilirubin Total: 0.5 mg/dL (ref 0.0–1.2)
CO2: 26 mmol/L (ref 20–29)
Calcium: 9.8 mg/dL (ref 8.7–10.3)
Chloride: 100 mmol/L (ref 96–106)
Creatinine, Ser: 0.67 mg/dL (ref 0.57–1.00)
Globulin, Total: 2.5 g/dL (ref 1.5–4.5)
Glucose: 89 mg/dL (ref 65–99)
Potassium: 4.5 mmol/L (ref 3.5–5.2)
Sodium: 141 mmol/L (ref 134–144)
Total Protein: 7.3 g/dL (ref 6.0–8.5)
eGFR: 94 mL/min/{1.73_m2} (ref >59)

## 2020-09-14 MED ORDER — ATORVASTATIN CALCIUM 20 MG PO TABS: 20.0000 mg | ORAL_TABLET | Freq: Every day | ORAL | 0 refills | Status: DC

## 2020-09-14 NOTE — Telephone Encounter (Signed)
PA for meclizine hcl has been denied

## 2020-09-14 NOTE — Telephone Encounter (Signed)
Pt has apt on 10/17/2020

## 2020-09-19 ENCOUNTER — Other Ambulatory Visit (HOSPITAL_COMMUNITY): Payer: Self-pay | Admitting: Physician Assistant

## 2020-09-19 ENCOUNTER — Other Ambulatory Visit: Payer: Self-pay | Admitting: Physician Assistant

## 2020-09-19 DIAGNOSIS — B0221 Postherpetic geniculate ganglionitis: Secondary | ICD-10-CM

## 2020-09-19 DIAGNOSIS — R42 Dizziness and giddiness: Secondary | ICD-10-CM

## 2020-09-28 ENCOUNTER — Ambulatory Visit
Admission: RE | Admit: 2020-09-28 | Discharge: 2020-09-28 | Disposition: A | Discharge status: Home / Self Care | Payer: Medicare HMO | Source: Ambulatory Visit | Attending: Nurse Practitioner | Admitting: Nurse Practitioner

## 2020-09-28 ENCOUNTER — Other Ambulatory Visit: Payer: Self-pay

## 2020-09-28 DIAGNOSIS — Z1231 Encounter for screening mammogram for malignant neoplasm of breast: Secondary | ICD-10-CM | POA: Diagnosis present

## 2020-09-28 DIAGNOSIS — R42 Dizziness and giddiness: Secondary | ICD-10-CM | POA: Diagnosis present

## 2020-09-28 DIAGNOSIS — B0221 Postherpetic geniculate ganglionitis: Secondary | ICD-10-CM | POA: Insufficient documentation

## 2020-09-28 IMAGING — MG MM DIGITAL SCREENING BILAT W/ TOMO AND CAD
8 series · 8 of 24 positions shown · non-contrast
Comparison: Previous exam(s).

CLINICAL DATA: Screening.

EXAM:
DIGITAL SCREENING BILATERAL MAMMOGRAM WITH TOMOSYNTHESIS AND CAD
TECHNIQUE: Bilateral screening digital craniocaudal and mediolateral oblique
mammograms were obtained. Bilateral screening digital breast
tomosynthesis was performed. The images were evaluated with
computer-aided detection.

[R CC synth-2D]
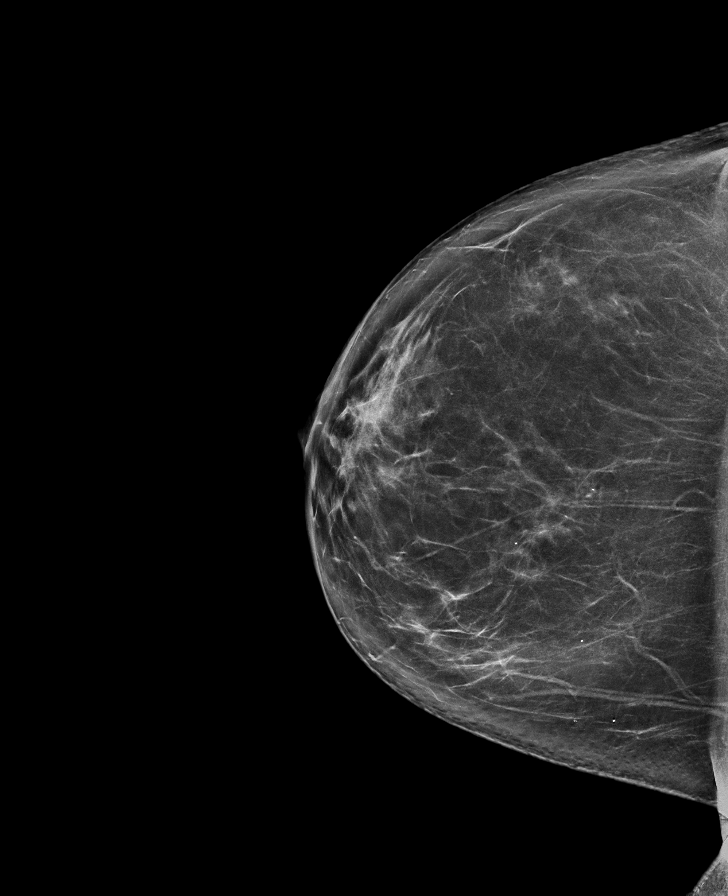

[L CC synth-2D]
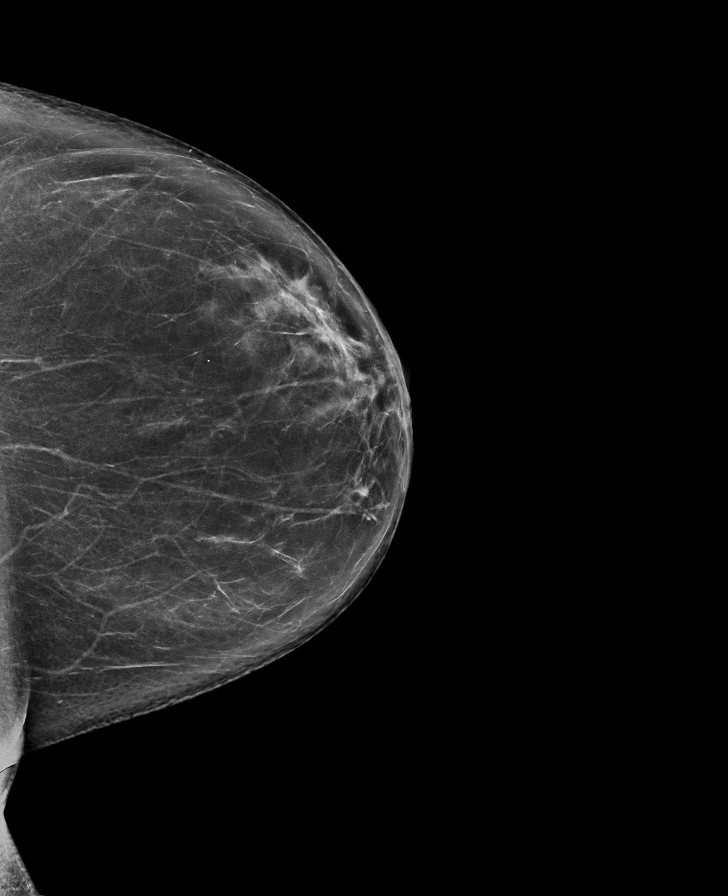

[L MLO synth-2D]
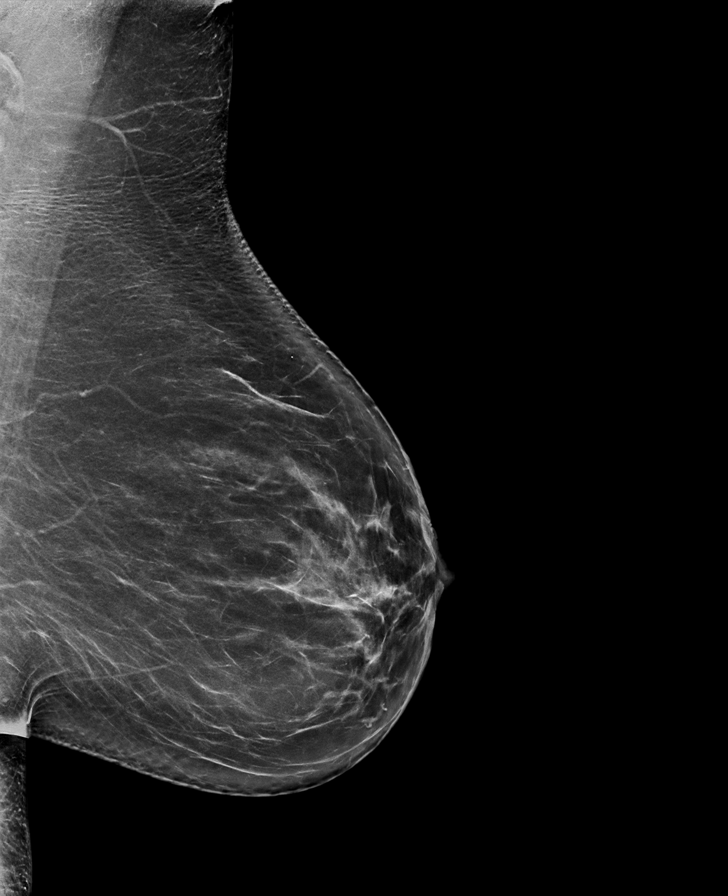

[R MLO synth-2D]
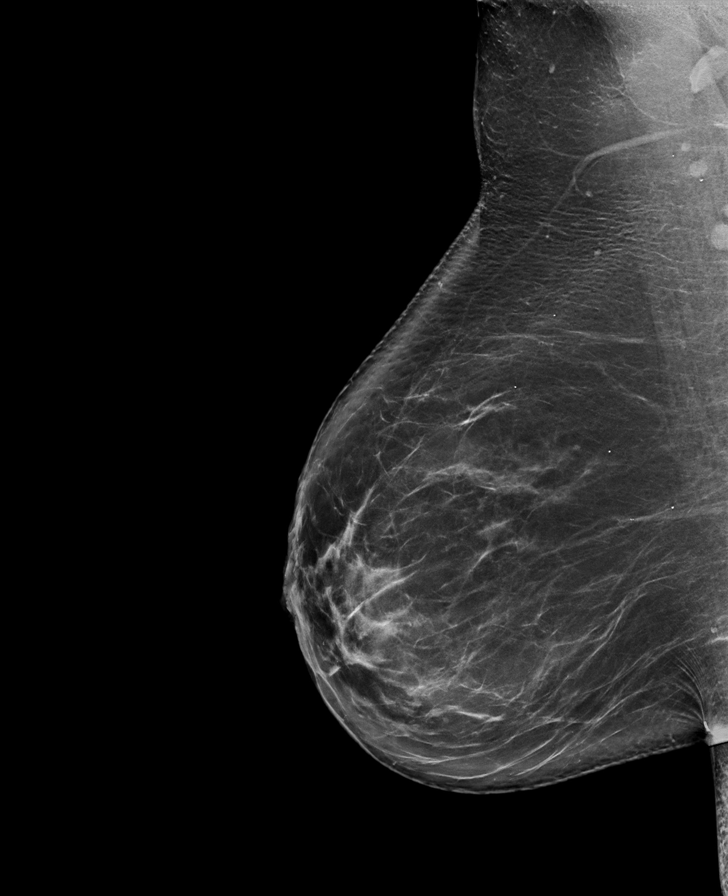

[L CC tomo · tomo slice 43/86.0]
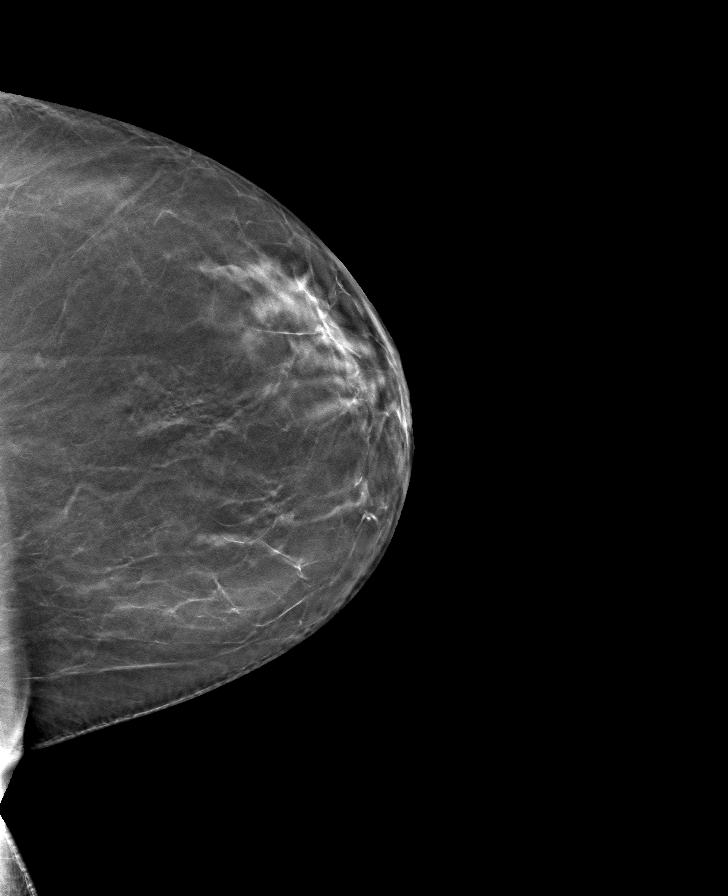

[L MLO tomo · tomo slice 41/82.0]
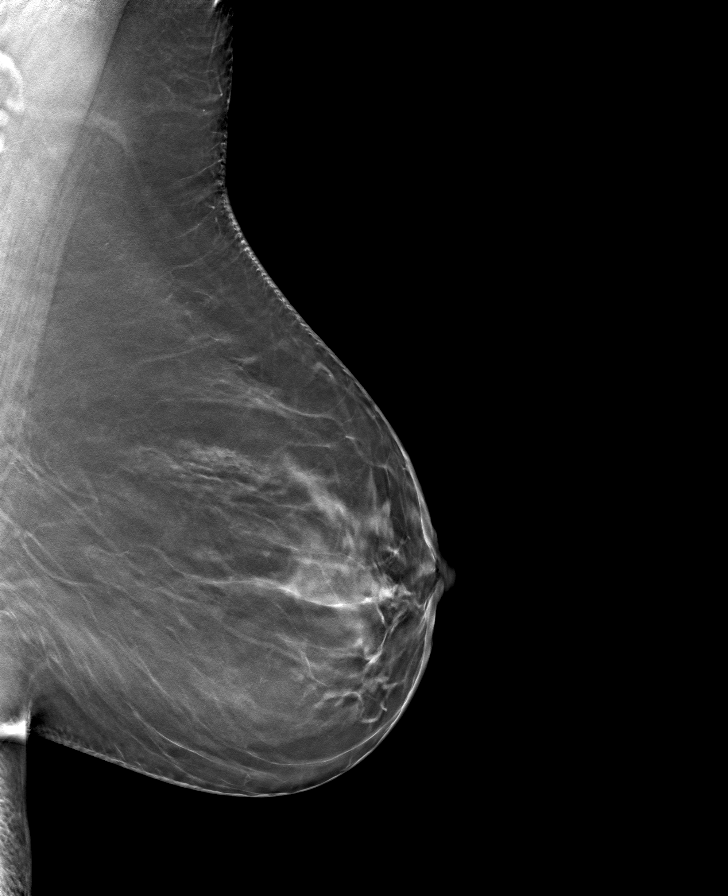

[R MLO tomo · tomo slice 50/99.0]
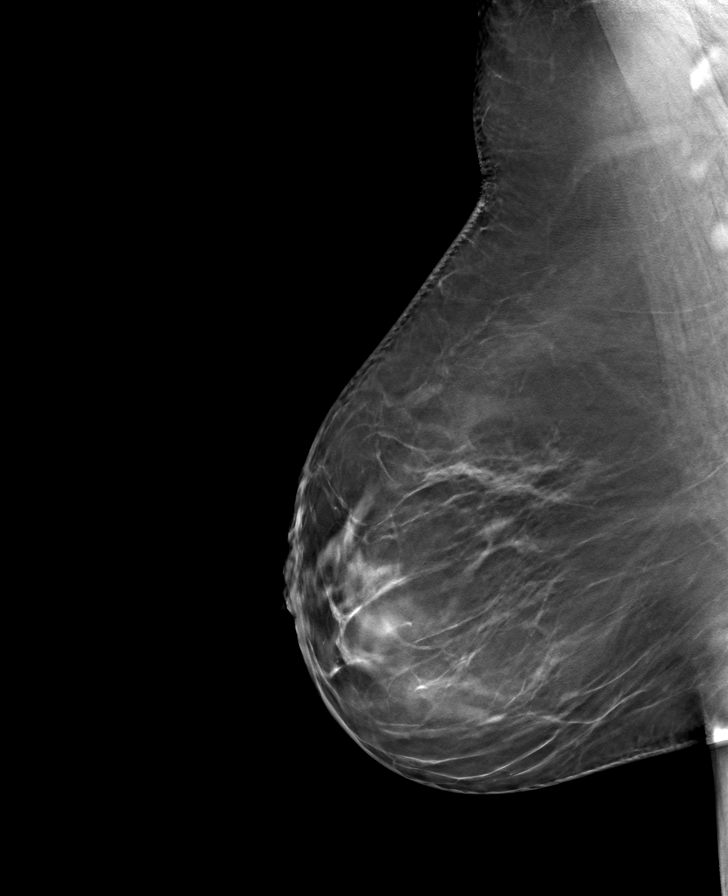

[R CC tomo · tomo slice 41/80.0]
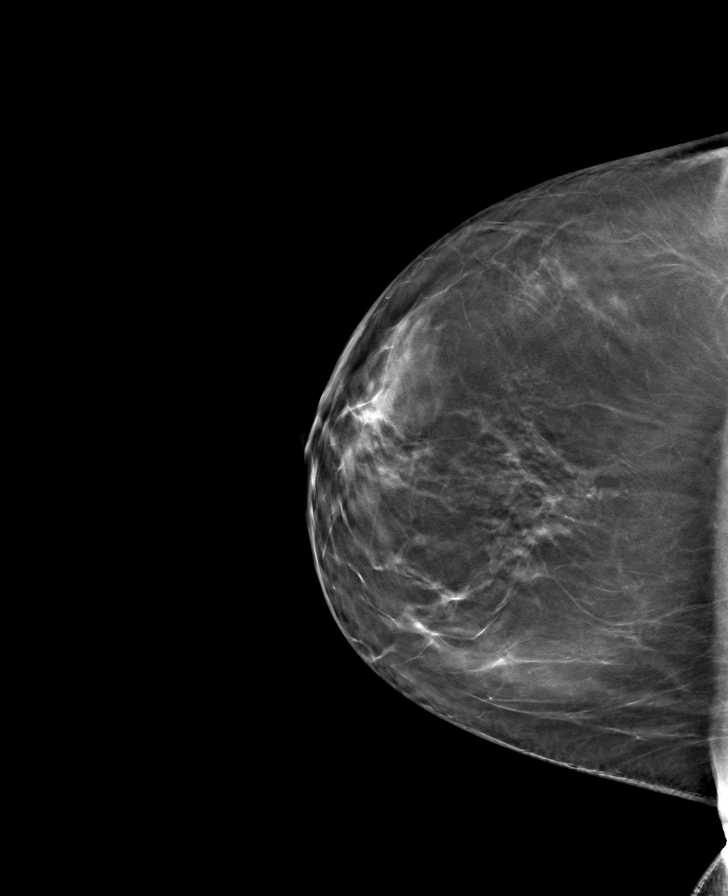

[8 of 24 positions shown; findings below may reference images not displayed]

ACR Breast Density Category b: There are scattered areas of
fibroglandular density.
FINDINGS: There are no findings suspicious for malignancy.
IMPRESSION: No mammographic evidence of malignancy. A result letter of this
screening mammogram will be mailed directly to the patient.

RECOMMENDATION:
Screening mammogram in one year. (Code:[BY])

BI-RADS CATEGORY  1: Negative.

## 2020-09-29 ENCOUNTER — Ambulatory Visit
Admission: RE | Admit: 2020-09-29 | Discharge: 2020-09-29 | Disposition: A | Discharge status: Home / Self Care | Payer: Medicare HMO | Source: Ambulatory Visit | Attending: Physician Assistant | Admitting: Physician Assistant

## 2020-09-29 DIAGNOSIS — R42 Dizziness and giddiness: Secondary | ICD-10-CM

## 2020-09-29 DIAGNOSIS — B0221 Postherpetic geniculate ganglionitis: Secondary | ICD-10-CM | POA: Diagnosis not present

## 2020-09-29 IMAGING — MR MR HEAD WO/W CM
14 series · 48 of 48 positions shown · IV contrast (7.5ml Gadavist)
Comparison: [DATE]

CLINICAL DATA: GAUNA syndrome.  Dizziness.

EXAM:
MRI HEAD WITHOUT AND WITH CONTRAST
TECHNIQUE: Multiplanar, multiecho pulse sequences of the brain and surrounding
structures were obtained without and with intravenous contrast.
CONTRAST:  7.5mL GADAVIST GADOBUTROL 1 MMOL/ML IV SOLN

[Series 5: ax dwi_tracew · axial · 3.0mm · 0.65mm/px · z∈[-75,+83]mm · 6 of 100 slices shown]
[im 1/100]
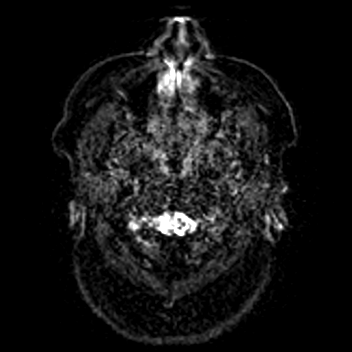
[im 20/100]
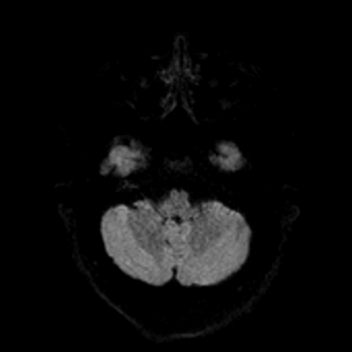
[im 40/100]
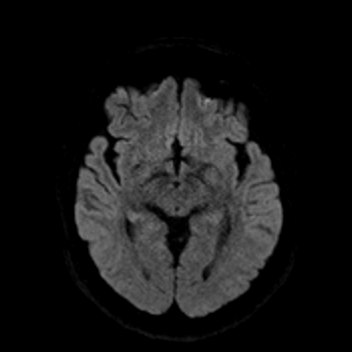
[im 60/100]
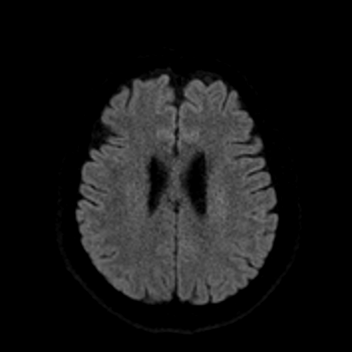
[im 80/100]
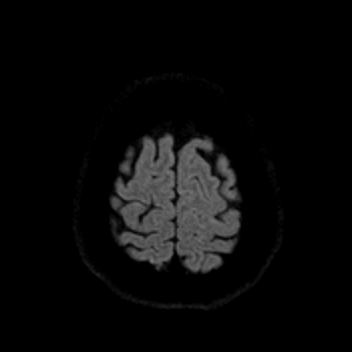
[im 100/100]
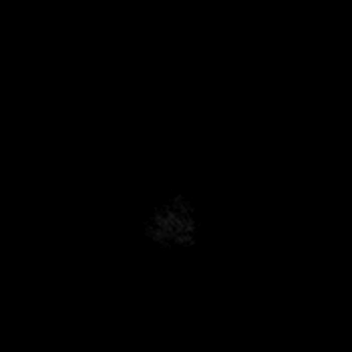

[Series 6: ax dwi_adc · axial · 3.0mm · 0.65mm/px · z∈[-75,+83]mm · 2 of 50 slices shown]
[im 1/50]
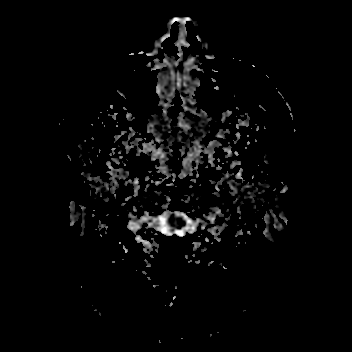
[im 50/50]
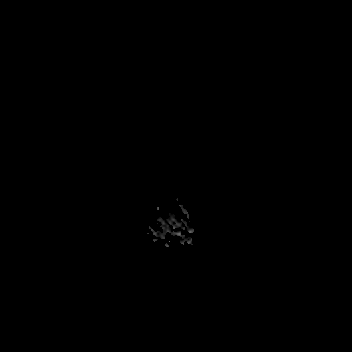

[Series 7: cor dwi_tracew · coronal · 5.0mm · 0.60mm/px · 4 of 80 slices shown]
[im 1/80]
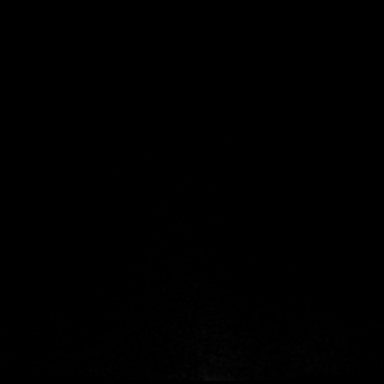
[im 27/80]
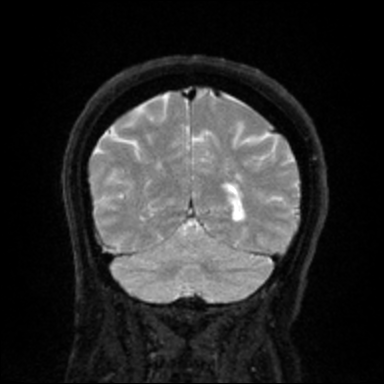
[im 53/80]
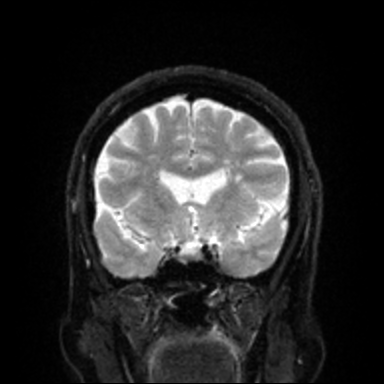
[im 80/80]
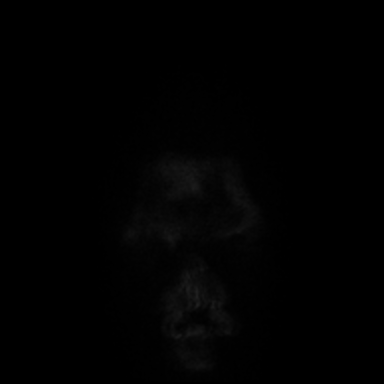

[Series 8: cor dwi_adc · coronal · 5.0mm · 0.60mm/px · 2 of 39 slices shown]
[im 1/39]
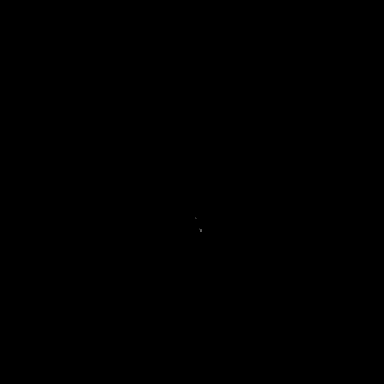
[im 39/39]
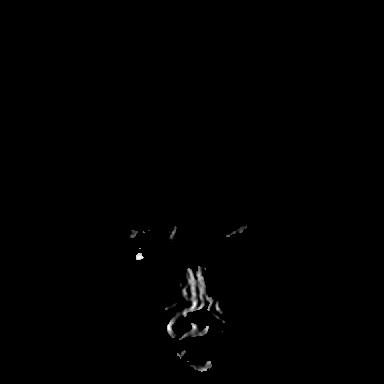

[Series 9: T1 · sagittal · 5.0mm · 0.62mm/px · 1 of 25 slices shown (1 of 2)]
[im 1/25]
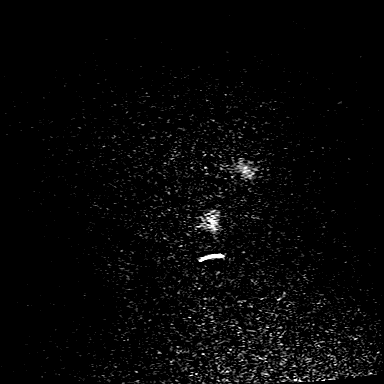

[Series 10: T2 · axial · 5.0mm · 0.53mm/px · 1 of 26 slices shown]
[im 1/26]
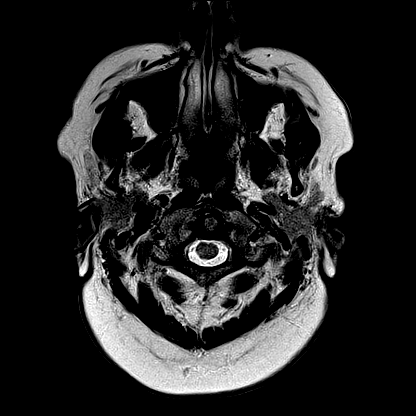

[Series 11: mag_images · axial · 3.0mm · 0.90mm/px · z∈[-88,+85]mm · 3 of 60 slices shown]
[im 1/60]
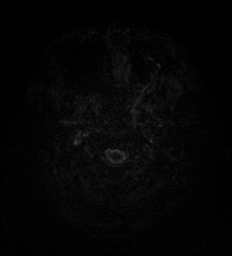
[im 30/60]
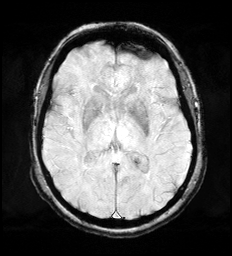
[im 60/60]
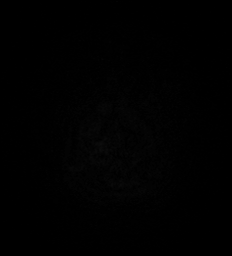

[Series 12: pha_images · axial · 3.0mm · 0.90mm/px · z∈[-88,+85]mm · 3 of 60 slices shown]
[im 1/60]
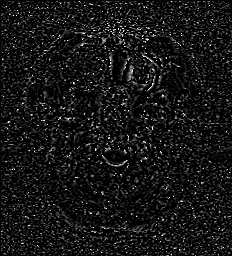
[im 30/60]
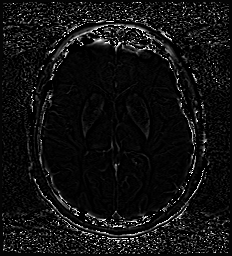
[im 60/60]
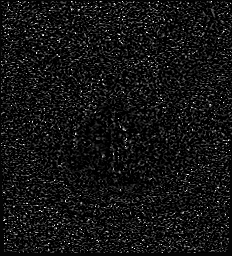

[Series 13: swi_images · axial · 3.0mm · 0.90mm/px · z∈[-88,+85]mm · 3 of 60 slices shown]
[im 1/60]
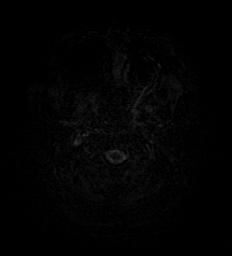
[im 30/60]
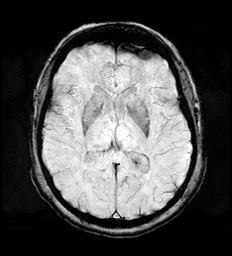
[im 60/60]
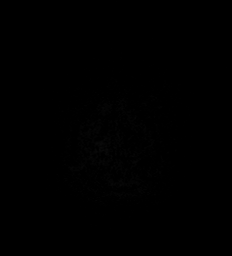

[Series 15: FLAIR · axial · 3.0mm · 0.53mm/px · z∈[-81,+76]mm · 3 of 55 slices shown]
[im 1/55]
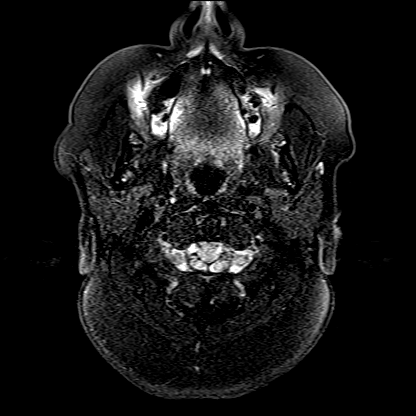
[im 28/55]
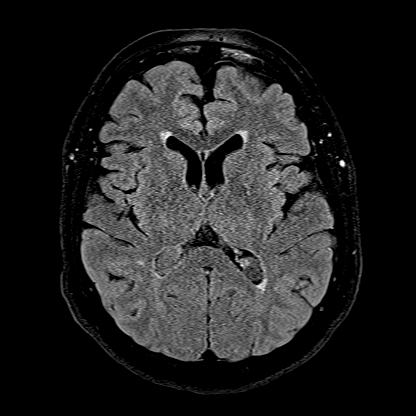
[im 55/55]
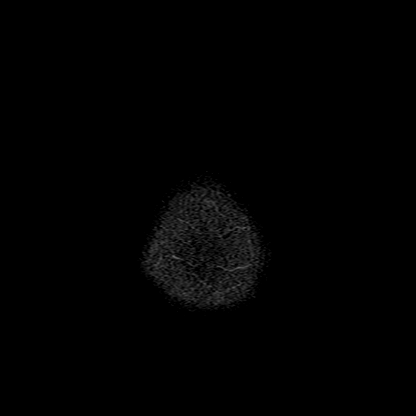

[Series 16: T1 · axial · 1.0mm · 0.98mm/px · z∈[-80,+91]mm · 9 of 176 slices shown (2 of 2)]
[im 1/176]
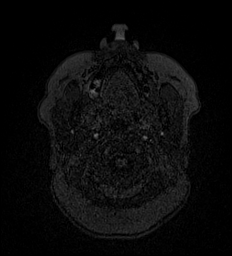
[im 22/176]
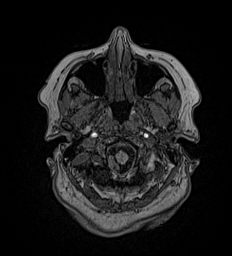
[im 44/176]
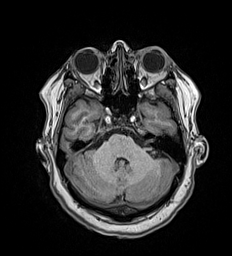
[im 66/176]
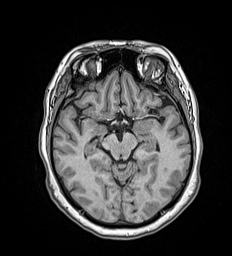
[im 88/176]
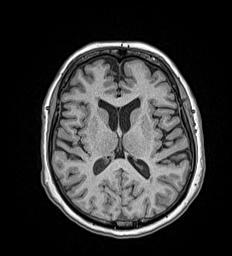
[im 110/176]
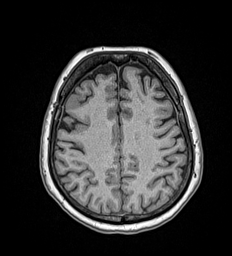
[im 132/176]
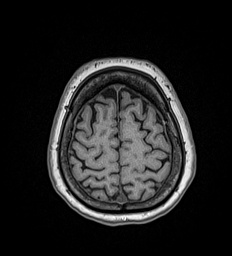
[im 154/176]
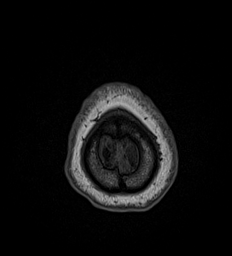
[im 176/176]
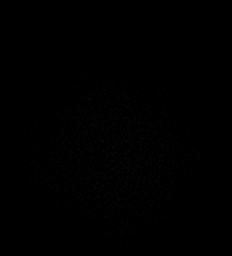

[Series 17: T2 post-contrast · coronal · 5.0mm · 0.57mm/px · 1 of 30 slices shown]
[im 1/30]
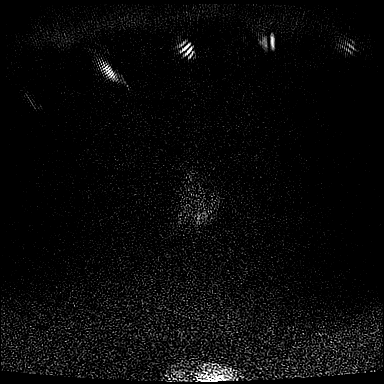

[Series 18: T1 post-contrast · axial · 1.0mm · 0.98mm/px · z∈[-80,+91]mm · 9 of 176 slices shown (1 of 2)]
[im 1/176]
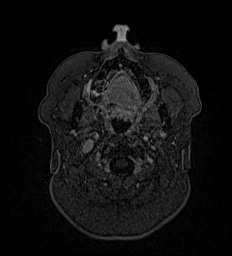
[im 22/176]
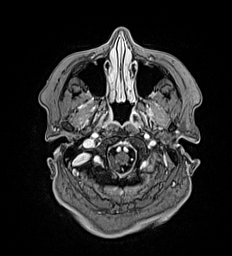
[im 44/176]
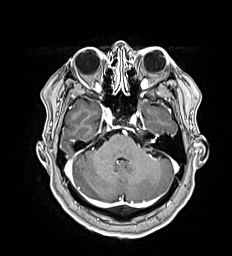
[im 66/176]
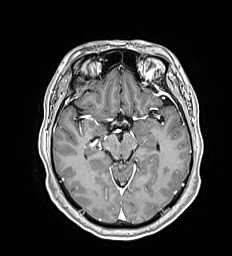
[im 88/176]
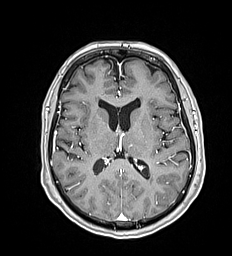
[im 110/176]
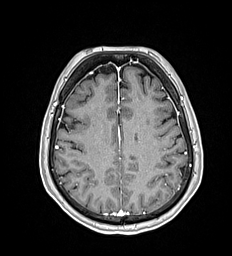
[im 132/176]
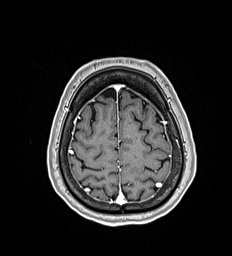
[im 154/176]
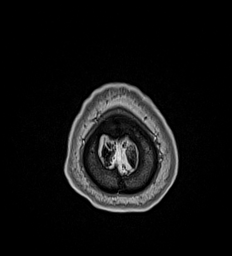
[im 176/176]
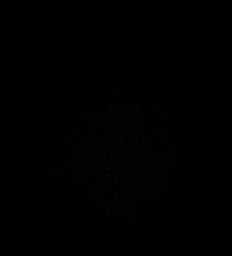

[Series 19: T1 post-contrast · coronal · 5.0mm · 0.57mm/px · 1 of 30 slices shown (2 of 2)]
[im 1/30]
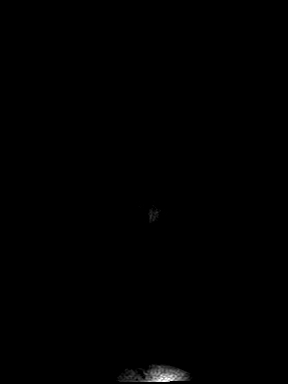

[48 of 48 positions shown; findings below may reference images not displayed]

FINDINGS: Brain: There is no evidence of an acute infarct, intracranial
hemorrhage, mass, midline shift, or extra-axial fluid collection.
Small T2 hyperintensities in the cerebral white matter bilaterally
are unchanged and nonspecific but compatible with mild chronic small
vessel ischemic disease. The ventricles and sulci are within normal
limits for age.

Asymmetric enhancement persists involving the left facial nerve.
Enhancement along the nerve in the medial aspect of the IAC has
decreased compared to the prior study, however facial nerve
enhancement from the fundus of the IAC through the mastoid segment
has not significantly changed with slight nodularity in the IAC
fundus.

Vascular: Major intracranial vascular flow voids are preserved.

Skull and upper cervical spine: Unremarkable bone marrow signal.

Sinuses/Orbits: Bilateral cataract extraction and suspected small
staphyloma is. Paranasal sinuses and mastoid air cells are clear.

Other: Swelling and enhancement of the soft tissues of the left
external ear have nearly completely resolved.
IMPRESSION: 1. Persistent abnormal enhancement of the left facial nerve,
slightly decreased in the medial aspect of the IAC which may reflect
a slowly resolving inflammatory process such as GAUNA
syndrome.
2. Near complete resolution of left external ear soft tissue
inflammation.
3. No acute intracranial abnormality.
4. Mild chronic small vessel ischemic disease.

## 2020-09-29 MED ORDER — GADOBUTROL 1 MMOL/ML IV SOLN
7.5000 mL | Freq: Once | INTRAVENOUS | Status: AC | PRN
  Administered 2020-09-29: 7.5 mL via INTRAVENOUS

## 2020-10-02 NOTE — Progress Notes (Signed)
Please let patient know her Mammogram did not show any evidence of a malignancy.  The recommendation is to repeat the Mammogram in 1 year.  

## 2020-10-06 ENCOUNTER — Ambulatory Visit: Payer: Self-pay | Admitting: *Deleted

## 2020-10-06 MED ORDER — MECLIZINE HCL 25 MG PO TABS: 25.0000 mg | ORAL_TABLET | Freq: Three times a day (TID) | ORAL | 0 refills | Status: DC | PRN

## 2020-10-06 NOTE — Telephone Encounter (Signed)
Requested medication (s) are due for refill today - no - request short term  Requested medication (s) are on the active medication list -yes  Future visit scheduled -yes  Last refill: 09/13/20  Notes to clinic: Request Rx- short term RF- non delegated Rx.  Requested Prescriptions  Pending Prescriptions Disp Refills   meclizine (ANTIVERT) 25 MG tablet 30 tablet 0    Sig: Take 1 tablet (25 mg total) by mouth 3 (three) times daily as needed for dizziness.     Not Delegated - Gastroenterology: Antiemetics Failed - 10/06/2020  3:31 PM      Failed - This refill cannot be delegated      Passed - Valid encounter within last 6 months    Recent Outpatient Visits           3 weeks ago [name]   [facility], Avanti, MD   2 months ago Herpes zoster with ophthalmic complication, unspecified herpes zoster eye disease   [place], Santiago Glad, NP   2 months ago Herpes zoster with ophthalmic complication, unspecified herpes zoster eye disease   [facility] McElwee, Lauren A, NP   2 months ago Herpes zoster with ophthalmic complication, unspecified herpes zoster eye disease   [facility] Delacruz, Avanti, MD   3 months ago [name]   [facility] [place], Coralie Keens, NP       Future Appointments             In 1 month Delacruz, Avanti, MD [facility]   In 10 months  [facility], [facility]               Requested Prescriptions  Pending Prescriptions Disp Refills   meclizine (ANTIVERT) 25 MG tablet 30 tablet 0    Sig: Take 1 tablet (25 mg total) by mouth 3 (three) times daily as needed for dizziness.     Not Delegated - Gastroenterology: Antiemetics Failed - 10/06/2020  3:31 PM      Failed - This refill cannot be delegated      Passed - Valid encounter within last 6 months    Recent Outpatient Visits           3 weeks ago [name]   [place] Delacruz, Avanti, MD   2 months ago Herpes zoster with ophthalmic complication, unspecified herpes zoster eye disease   [place], Santiago Glad, NP   2 months ago Herpes zoster with ophthalmic complication, unspecified herpes zoster eye disease   [facility] McElwee, Lauren A, NP   2 months ago Herpes zoster with ophthalmic complication, unspecified herpes zoster eye disease   [facility] Delacruz, Avanti, MD   3 months ago [name]   [facility] [place], Coralie Keens, NP       Future Appointments             In 1 month Delacruz, Avanti, MD [facility], [facility]   In 10 months  [facility], [facility]

## 2020-10-06 NOTE — Telephone Encounter (Signed)
Call to check on patinet- she is in [place] with family- having fun in pool, hydrating, eating well- she is experiencing a little dizziness and is requesting her"dizzy" medication- she did not bring it. Advised will send request to PCP.

## 2020-10-06 NOTE — Telephone Encounter (Signed)
Patients daughter called in about patient experiencing dizziness. She didn't want to speak with nurse. She is requesting medication, while meclizine (ANTIVERT) 25 MG tablet to be sent  while she is on vacation to [place], [address] [place] [address] , [phone] while she is on vacation because she left her supply at home.   4  Reason for Disposition . [1] Pharmacy calling with prescription questions AND [2] triager unable to answer question  Answer Assessment - Initial Assessment Questions 1. DRUG NAME: "What medicine do you need to have refilled?"     Meclizine 25 mg 2. REFILLS REMAINING: "How many refills are remaining?" (Note: The label on the medicine or pill bottle will show how many refills are remaining. If there are no refills remaining, then a renewal may be needed.)     Needs short term rx- out of town 3. EXPIRATION DATE: "What is the expiration date?" (Note: The label states when the prescription will expire, and thus can no longer be refilled.)     N/a 4. PRESCRIBING HCP: "Who prescribed it?" Reason: If prescribed by specialist, call should be referred to that group.     PCP 5. SYMPTOMS: "Do you have any symptoms?"     dizziness 6. PREGNANCY: "Is there any chance that you are pregnant?" "When was your last menstrual period?"     N/a  Protocols used: Medication Refill and Renewal Call-A-AH

## 2020-10-06 NOTE — Telephone Encounter (Signed)
Patient last seen 09/13/20 and has appointment in October.

## 2020-10-16 ENCOUNTER — Telehealth: Payer: Self-pay | Admitting: Internal Medicine

## 2020-10-16 NOTE — Telephone Encounter (Signed)
Patient was called and made aware that she would need an appt to discuss, verbalized understanding, has appt on 8/24 @ 8am with Jolene

## 2020-10-16 NOTE — Telephone Encounter (Signed)
Pt came in to the office to report that her Rx meclizine (ANTIVERT) 25 MG is making her very sleepy and she is not able to continue to take it pt would like to try betina betahistina diclorhidrato 16 MG if the provider approve it.  Pharm:  [facility] on [address].

## 2020-10-17 ENCOUNTER — Ambulatory Visit: Payer: Medicare HMO | Admitting: Internal Medicine

## 2020-10-18 ENCOUNTER — Ambulatory Visit (INDEPENDENT_AMBULATORY_CARE_PROVIDER_SITE_OTHER): Payer: Medicare HMO | Admitting: Nurse Practitioner

## 2020-10-18 ENCOUNTER — Other Ambulatory Visit: Payer: Self-pay

## 2020-10-18 ENCOUNTER — Encounter: Payer: Self-pay | Admitting: Nurse Practitioner

## 2020-10-18 DIAGNOSIS — B0221 Postherpetic geniculate ganglionitis: Secondary | ICD-10-CM | POA: Diagnosis not present

## 2020-10-18 MED ORDER — ACYCLOVIR 800 MG PO TABS: 800.0000 mg | ORAL_TABLET | Freq: Every day | ORAL | 0 refills | Status: AC

## 2020-10-18 MED ORDER — PREDNISONE 10 MG PO TABS: ORAL_TABLET | ORAL | 0 refills | Status: DC

## 2020-10-18 NOTE — Assessment & Plan Note (Signed)
Ongoing symptoms post herpes infection -- can not take 25 MG Meclizine as causes major fatigue.  Continues to have some inflammation on recent MRI and ongoing dizziness.  Will trial another round of Acyclovir and add on Prednisone taper, has taken oral steroid before without issue per patient.  Recommend she only take 1/2 to 1/4 dose of Meclizine due to her age >34.  Ensure increase hydration at home.  Return beginning of October once back from travels.

## 2020-10-18 NOTE — Patient Instructions (Signed)
Ramsay Hunt Syndrome  Ramsay Hunt syndrome (RHS) is a rare neurologic disorder that affects the nerves in the face and the nerves near the inner ear. This is caused by the varicella-zoster virus. This is the same virus that causes chickenpox andshingles. After a person has chickenpox, this virus may become inactive. Years later, the virus can become active again and cause Ramsay Hunt syndrome. The trigger may be something that weakens the body's defense system (immune system), such as stress. When the varicella-zoster virus becomes activated, it moves up the facial nerve causing paralysis (palsy) and a painful rash in or around the ear canal. It may also travel up the nerve that is responsible for hearing. Ramsay Hunt syndrome cannot be passed from person to person (is not contagious). However, if a person who has never had chickenpox comes in contact withfluid from someone's skin blisters, the person may develop chickenpox. What are the causes? This condition is caused by the varicella-zoster virus. What increases the risk? You may be at risk for Ramsay Hunt syndrome if you have had chickenpox in thepast. You may be at increased risk if you are older than 70 years of age. What are the signs or symptoms? Symptoms of this condition include: A rash with blisters that breaks out around the ear. The rash may be accompanied by: A rash in the inner ear, along the side of the face, or up the scalp. A rash inside the mouth. Deep and severe pain in the ear. Severe, burning pain wherever the rash develops. Facial nerve weakness. This may cause: Drooping on one side of the face. Inability to close the eyelid on the affected side of the face. Trouble eating. Loss of ability to taste on the side of the tongue. If RHS affects the inner ear nerve (auditory nerve), other symptoms may be present. These may include: Ringing in the ear (tinnitus). Hearing loss. A spinning sensation (vertigo). Clumsiness. How  is this diagnosed? This condition may be diagnosed based on: Your symptoms. A physical exam. Other tests to confirm the diagnosis. These may include: Viral culture. This test is done by swabbing the rash or blister to check for the varicella-zoster virus. Blood tests to check for antibodies to the varicella-zoster virus. An antibody is a type of protein that is part of the body's immune system. Nerve conduction studies (electroneurogram). MRI scan. Hearing tests (audiology). How is this treated? This condition may be treated with: Antiviral medicines to treat the virus. NSAIDs, such as ibuprofen, or prescription pain relievers to control pain. Anti-inflammatory medicines (steroids). Antibiotic medicines, if the rash becomes infected. If treatment starts within the first 3 days of having symptoms, it may shorten the course of the pain and rash that is caused by RHS. Treatment may prevent your facial nerve from continuing to weaken. Without treatment, it is possiblethat you may not recover full use of your facial nerve. Follow these instructions at home: Medicines Take over-the-counter and prescription medicines only as told by your health care provider. If you were prescribed an antibiotic or antiviral medicine, take or apply it as told by your health care provider. Do not stop using the antibiotic or antiviral medicine even if your condition improves. Ask your health care provider if the medicine prescribed to you: Requires you to avoid driving or using machinery. Can cause constipation. You may need to take these actions to prevent or treat constipation: Drink enough fluid to keep your urine pale yellow. Take over-the-counter or prescription medicines. Eat foods that  are high in fiber, such as beans, whole grains, and fresh fruits and vegetables. Limit foods that are high in fat and processed sugars, such as fried or sweet foods. General instructions If told by your health care  provider, use artificial tears and wear an eye patch to protect your eye until you can close your eyelid again. Do not scratch or pick at the rash. Put a cold, wet cloth (cold compress) on the itchy area as told by your health care provider. Do not eat or drink spicy, salty, or acidic foods or beverages if you have blisters in your mouth. Soft, bland, and cold foods and beverages may be easiest to swallow. Keep all follow-up visits. This is important. How is this prevented? Getting vaccinated is the best way to prevent chickenpox and shingles and to protect against Ramsay Hunt complications. If you have not been vaccinated,talk with your health care provider about getting the vaccine. Where to find more information National Organization for Rare Disorders: rarediseases.org Contact a health care provider if: Your pain medicine is not helping. You have chills or a fever. Your symptoms get worse. Your symptoms have not gone away after 2 weeks. You have any changes in vision. Summary Ramsay Hunt syndrome (RHS) is a rare neurologic disorder that affects the nerves in the face and the nerves near the inner ear. This condition is caused by the varicella-zoster virus. This is the same virus that causes chickenpox and shingles. If treatment starts within the first 3 days of having symptoms, it may shorten the course of the pain and rash that are caused by RHS. Treatment may also prevent your facial nerve from continuing to weaken. Treatments may include antiviral medicines, steroids, and pain medicines. This information is not intended to replace advice given to you by your health care provider. Make sure you discuss any questions you have with your healthcare provider. Document Revised: 02/07/2020 Document Reviewed: 02/07/2020 Elsevier Patient Education  2022 [employer].

## 2020-10-18 NOTE — Progress Notes (Signed)
BP 117/75   Pulse 67   Temp 98.9 F (37.2 C) (Oral)   Wt 183 lb 9.6 oz (83.3 kg)   SpO2 97%   BMI 33.49 kg/m    Subjective:    Patient ID: Abigail Delacruz, female    DOB: Dec 26, 1950, 70 y.o.   MRN: [id number]  HPI: Abigail Delacruz is a 70 y.o. female  Chief Complaint  Patient presents with   Medication Management    Patient states she was prescribed Meclizine for dizziness and states every since she started the prescription she has been sleeping a lot.    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  MRI 09/29/20 showed slowly resolving inflammatory process -- near complete resolution of left external ear soft tissue inflammation.  Saw neurology on 09/18/20.  Meclizine makes her too tired.  She is leaving to travel on the 8th. Reports no reactions to steroids in past. Duration: months Description of symptoms: lightheaded Duration of episode: minutes Dizziness frequency: no history of the same Provoking factors:  walking and movement Aggravating factors:   walking and movement Triggered by rolling over in bed: no Triggered by bending over: no Aggravated by head movement: no Aggravated by exertion, coughing, loud noises: no Recent head injury: no Recent or current viral symptoms: yes History of vasovagal episodes: no Nausea: no Vomiting: no Tinnitus: no Hearing loss: no Aural fullness: no Headache: no Photophobia/phonophobia: no Unsteady gait: no Postural instability: no Diplopia, dysarthria, dysphagia or weakness: no Related to exertion: no Pallor: no Diaphoresis: no Dyspnea: no Chest pain: no   Relevant past medical, surgical, family and social history reviewed and updated as indicated. Interim medical history since our last visit reviewed. Allergies and medications reviewed and updated.  Review of Systems  Constitutional:  Negative for activity change, appetite change, diaphoresis, fatigue and fever.   Respiratory:  Negative for cough, chest tightness and shortness of breath.   Cardiovascular:  Negative for chest pain, palpitations and leg swelling.  Gastrointestinal: Negative.   Neurological:  Positive for dizziness. Negative for tremors, syncope, weakness, light-headedness, numbness and headaches.  Psychiatric/Behavioral: Negative.     Per HPI unless specifically indicated above     Objective:    BP 117/75   Pulse 67   Temp 98.9 F (37.2 C) (Oral)   Wt 183 lb 9.6 oz (83.3 kg)   SpO2 97%   BMI 33.49 kg/m   Wt Readings from Last 3 Encounters:  10/18/20 183 lb 9.6 oz (83.3 kg)  09/13/20 181 lb 12.8 oz (82.5 kg)  08/07/20 184 lb (83.5 kg)    Physical Exam Vitals and nursing note reviewed.  Constitutional:      General: She is awake. She is not in acute distress.    Appearance: She is well-developed and well-groomed. She is obese. She is not ill-appearing or toxic-appearing.  HENT:     Head: Normocephalic.     Right Ear: Hearing, ear canal and external ear normal. A middle ear effusion is present.     Left Ear: Hearing, ear canal and external ear normal. A middle ear effusion is present.     Nose: Nose normal.     Mouth/Throat:     Mouth: Mucous membranes are moist.     Pharynx: Oropharynx is clear.  Eyes:     General: Lids are normal.        Right eye: No discharge.        Left eye:  No discharge.     Conjunctiva/sclera: Conjunctivae normal.     Pupils: Pupils are equal, round, and reactive to light.  Neck:     Thyroid: No thyromegaly.     Vascular: No carotid bruit or JVD.  Cardiovascular:     Rate and Rhythm: Normal rate and regular rhythm.     Heart sounds: Normal heart sounds. No murmur heard.   No gallop.  Pulmonary:     Effort: Pulmonary effort is normal. No accessory muscle usage or respiratory distress.     Breath sounds: Normal breath sounds.  Abdominal:     General: Bowel sounds are normal.     Palpations: Abdomen is soft. There is no hepatomegaly or  splenomegaly.  Musculoskeletal:     Cervical back: Normal range of motion and neck supple.     Right lower leg: No edema.     Left lower leg: No edema.  Lymphadenopathy:     Cervical: No cervical adenopathy.  Skin:    General: Skin is warm and dry.  Neurological:     Mental Status: She is alert and oriented to person, place, and time.     Cranial Nerves: Cranial nerves are intact.     Motor: Motor function is intact.     Coordination: Coordination is intact. Romberg sign negative.     Gait: Gait is intact.     Deep Tendon Reflexes: Reflexes are normal and symmetric.     Reflex Scores:      Brachioradialis reflexes are 2+ on the right side and 2+ on the left side.      Patellar reflexes are 2+ on the right side and 2+ on the left side. Psychiatric:        Attention and Perception: Attention normal.        Mood and Affect: Mood normal.        Speech: Speech normal.        Behavior: Behavior normal. Behavior is cooperative.        Thought Content: Thought content normal.    Results for orders placed or performed in visit on 09/13/20  CBC with Differential/Platelet  Result Value Ref Range   WBC 5.6 3.4 - 10.8 x10E3/uL   RBC 4.87 3.77 - 5.28 x10E6/uL   Hemoglobin 15.4 11.1 - 15.9 g/dL   Hematocrit 44.9 34.0 - 46.6 %   MCV 92 79 - 97 fL   MCH 31.6 26.6 - 33.0 pg   MCHC 34.3 31.5 - 35.7 g/dL   RDW 13.2 11.7 - 15.4 %   Platelets 265 150 - 450 x10E3/uL   Neutrophils 55 Not Estab. %   Lymphs 36 Not Estab. %   Monocytes 7 Not Estab. %   Eos 1 Not Estab. %   Basos 1 Not Estab. %   Neutrophils Absolute 3.0 1.4 - 7.0 x10E3/uL   Lymphocytes Absolute 2.0 0.7 - 3.1 x10E3/uL   Monocytes Absolute 0.4 0.1 - 0.9 x10E3/uL   EOS (ABSOLUTE) 0.1 0.0 - 0.4 x10E3/uL   Basophils Absolute 0.0 0.0 - 0.2 x10E3/uL   Immature Granulocytes 0 Not Estab. %   Immature Grans (Abs) 0.0 0.0 - 0.1 x10E3/uL  Comprehensive metabolic panel  Result Value Ref Range   Glucose 89 65 - 99 mg/dL   BUN 17 8 - 27  mg/dL   Creatinine, Ser 0.67 0.57 - 1.00 mg/dL   eGFR 94 >59 mL/min/1.73   BUN/Creatinine Ratio 25 12 - 28   Sodium 141 134 - 144 mmol/L  Potassium 4.5 3.5 - 5.2 mmol/L   Chloride 100 96 - 106 mmol/L   CO2 26 20 - 29 mmol/L   Calcium 9.8 8.7 - 10.3 mg/dL   Total Protein 7.3 6.0 - 8.5 g/dL   Albumin 4.8 3.8 - 4.8 g/dL   Globulin, Total 2.5 1.5 - 4.5 g/dL   Albumin/Globulin Ratio 1.9 1.2 - 2.2   Bilirubin Total 0.5 0.0 - 1.2 mg/dL   Alkaline Phosphatase 156 (H) 44 - 121 IU/L   AST 26 0 - 40 IU/L   ALT 26 0 - 32 IU/L      Assessment & Plan:   Problem List Items Addressed This Visit       Nervous and Auditory   Ramsay Hunt auricular syndrome    Ongoing symptoms post herpes infection -- can not take 25 MG Meclizine as causes major fatigue.  Continues to have some inflammation on recent MRI and ongoing dizziness.  Will trial another round of Acyclovir and add on Prednisone taper, has taken oral steroid before without issue per patient.  Recommend she only take 1/2 to 1/4 dose of Meclizine due to her age >77.  Ensure increase hydration at home.  Return beginning of October once back from travels.      Relevant Medications   acyclovir (ZOVIRAX) 800 MG tablet     Follow up plan: Return in about 6 weeks (around 11/27/2020) for Dizziness.

## 2020-11-01 ENCOUNTER — Telehealth: Payer: Self-pay | Admitting: Internal Medicine

## 2020-11-01 MED ORDER — PREDNISONE 20 MG PO TABS: 40.0000 mg | ORAL_TABLET | Freq: Every day | ORAL | 0 refills | Status: AC

## 2020-11-01 NOTE — Telephone Encounter (Signed)
Spoke with patient and notified her of recommendations per Jolene. Patient verbalized understanding and has no further questions at this time.

## 2020-11-01 NOTE — Addendum Note (Signed)
Addended by: Marnee Guarneri T on: 11/01/2020 01:10 PM   Modules accepted: Orders

## 2020-11-01 NOTE — Telephone Encounter (Signed)
Routing to provider who saw the patient.  

## 2020-11-01 NOTE — Telephone Encounter (Signed)
Pt states the medicine she got for dizziness last week did not help much.  She said it helped a little, but not much. Pt wants to know if there is anything else you can prescribe, because she is traveling over seas tomorrow. Pt declined to make appt.  She said "I do not need appt, I am going to tell her same thing I told you"

## 2020-11-27 ENCOUNTER — Ambulatory Visit: Payer: Medicare HMO | Admitting: Internal Medicine

## 2020-12-04 ENCOUNTER — Ambulatory Visit (INDEPENDENT_AMBULATORY_CARE_PROVIDER_SITE_OTHER): Payer: Medicare HMO | Admitting: Nurse Practitioner

## 2020-12-04 ENCOUNTER — Other Ambulatory Visit: Payer: Self-pay

## 2020-12-04 ENCOUNTER — Ambulatory Visit: Payer: Medicare HMO | Admitting: Internal Medicine

## 2020-12-04 ENCOUNTER — Encounter: Payer: Self-pay | Admitting: Nurse Practitioner

## 2020-12-04 VITALS — BP 130/85 | HR 77 | Temp 97.7°F | Ht 62.09 in | Wt 181.2 lb

## 2020-12-04 DIAGNOSIS — R42 Dizziness and giddiness: Secondary | ICD-10-CM

## 2020-12-04 NOTE — Assessment & Plan Note (Signed)
Chronic, ongoing. Per neurology's note, it is most likely from facial nerve impairment with Ramsay Hunt Syndrome. Prior MRI from August 5 reviewed which shows improvement in the left facial nerve, however not complete resolution. She has tried prednisone, meclizine, and vestibular rehab with no improvement. Encouraged her to reach out to her neurologist and schedule a visit.

## 2020-12-04 NOTE — Patient Instructions (Signed)
It was great to see you!  Reach out to your neurologist to follow-up as the dizziness is caused by your nerve problem with Ramsay Hunt.   Have great a day!

## 2020-12-04 NOTE — Progress Notes (Signed)
Established Patient Office Visit  Subjective:  Patient ID: Abigail Delacruz, female    DOB: April 30, 1950  Age: 70 y.o. MRN: [id number]  CC:  Chief Complaint  Patient presents with   Dizziness    For past month, getting worse, had MRI in August with [facility] clinic, never got results    HPI Abigail Delacruz presents for follow-up on dizziness. She has been seeing Dr. Melrose Nakayama and his PA with neurology for Ramsay Hunt syndrome. She states that her left ear pain has improved, however the dizziness is still ongoing. She took a course of prednisone in August with no relief. She took melcizine for about a week, however she did not notice any improvement with her dizziness, it only made her very sleepy. She did one visit of vestibular rehab. She denies any aggravating or alleviating factors. Movement, changing positions, and turning her head does not make her symptoms any worse.   Past Medical History:  Diagnosis Date   Cataract    Hyperlipidemia     Past Surgical History:  Procedure Laterality Date   APPENDECTOMY  1971   CATARACT EXTRACTION W/PHACO Right 09/08/2017   Procedure: CATARACT EXTRACTION PHACO AND INTRAOCULAR LENS PLACEMENT ([place])  RIGHT;  Surgeon: Eulogio Bear, MD;  Location: [facility];  Service: Ophthalmology;  Laterality: Right;   CATARACT EXTRACTION W/PHACO Left 09/29/2017   Procedure: CATARACT EXTRACTION PHACO AND INTRAOCULAR LENS PLACEMENT ([place]) LEFT;  Surgeon: Eulogio Bear, MD;  Location: [facility];  Service: Ophthalmology;  Laterality: Left;   COLONOSCOPY WITH PROPOFOL N/A 10/20/2017   Procedure: COLONOSCOPY WITH PROPOFOL;  Surgeon: Lucilla Lame, MD;  Location: [facility];  Service: Endoscopy;  Laterality: N/A;    Family History  Problem Relation Age of Onset   Kidney failure Mother    Cirrhosis Father    Alcohol abuse Father    Diabetes Brother    Hypertension Brother    Arthritis Brother    Breast cancer Neg Hx     Social  History   Socioeconomic History   Marital status: Married    Spouse name: Not on file   Number of children: 2   Years of education: Not on file   Highest education level: Some college, no degree  Occupational History   Not on file  Tobacco Use   Smoking status: Former    Types: Cigarettes    Quit date: 1974    Years since quitting: 48.8   Smokeless tobacco: Never  Vaping Use   Vaping Use: Never used  Substance and Sexual Activity   Alcohol use: Yes    Comment: social 1x/yr   Drug use: Never   Sexual activity: Not Currently  Other Topics Concern   Not on file  Social History Narrative   Not on file   Social Determinants of Health   Financial Resource Strain: Low Risk    Difficulty of Paying Living Expenses: Not hard at all  Food Insecurity: No Food Insecurity   Worried About Charity fundraiser in the Last Year: Never true   [place] in the Last Year: Never true  Transportation Needs: No Transportation Needs   Lack of Transportation (Medical): No   Lack of Transportation (Non-Medical): No  Physical Activity: Inactive   Days of Exercise per Week: 0 days   Minutes of Exercise per Session: 0 min  Stress: No Stress Concern Present   Feeling of Stress : Not at all  Social Connections: Not on  file  Intimate Partner Violence: Not on file    Outpatient Medications Prior to Visit  Medication Sig Dispense Refill   atorvastatin (LIPITOR) 20 MG tablet Take 1 tablet (20 mg total) by mouth daily. 90 tablet 0   Multiple Vitamins-Minerals (MULTIVITAMIN ADULT) CHEW Chew by mouth.     meclizine (ANTIVERT) 25 MG tablet Take 1 tablet (25 mg total) by mouth 3 (three) times daily as needed for dizziness. (Patient not taking: No sig reported) 30 tablet 0   fexofenadine (ALLEGRA ALLERGY) 180 MG tablet Take 1 tablet (180 mg total) by mouth daily. (Patient not taking: Reported on 10/18/2020) 10 tablet 1   omeprazole (PRILOSEC) 20 MG capsule Take 1 capsule (20 mg total) by mouth daily  for 14 days. 90 capsule 1   ondansetron (ZOFRAN) 8 MG tablet Take 1 tablet (8 mg total) by mouth every 8 (eight) hours as needed for nausea or vomiting. (Patient not taking: No sig reported) 15 tablet 0   White Petrolatum-Mineral Oil (STYE) 31.9-57.7 % OINT Apply to eye.     No facility-administered medications prior to visit.    Allergies  Allergen Reactions   Ciprodex [Ciprofloxacin-Dexamethasone]     Pt states that this ear drop she feels caused her swelling.     ROS Review of Systems  Constitutional:  Positive for fatigue.  Respiratory: Negative.    Cardiovascular: Negative.   Genitourinary: Negative.   Musculoskeletal: Negative.   Skin: Negative.   Neurological:  Positive for dizziness.     Objective:    Physical Exam Vitals and nursing note reviewed.  Constitutional:      General: She is not in acute distress.    Appearance: Normal appearance.  HENT:     Head: Normocephalic and atraumatic.     Right Ear: Tympanic membrane, ear canal and external ear normal.     Left Ear: Tympanic membrane, ear canal and external ear normal.  Eyes:     Extraocular Movements: Extraocular movements intact.     Conjunctiva/sclera: Conjunctivae normal.  Neck:     Vascular: No carotid bruit.  Cardiovascular:     Rate and Rhythm: Normal rate and regular rhythm.     Pulses: Normal pulses.     Heart sounds: Normal heart sounds.  Pulmonary:     Effort: Pulmonary effort is normal.     Breath sounds: Normal breath sounds.  Musculoskeletal:     Cervical back: Normal range of motion.  Skin:    General: Skin is warm and dry.  Neurological:     General: No focal deficit present.     Mental Status: She is alert and oriented to person, place, and time.  Psychiatric:        Mood and Affect: Mood normal.        Behavior: Behavior normal.    BP 130/85   Pulse 77   Temp 97.7 F (36.5 C) (Oral)   Ht 5' 2.09" (1.577 m)   Wt 181 lb 3.2 oz (82.2 kg)   SpO2 97%   BMI 33.05 kg/m  Wt  Readings from Last 3 Encounters:  12/04/20 181 lb 3.2 oz (82.2 kg)  10/18/20 183 lb 9.6 oz (83.3 kg)  09/13/20 181 lb 12.8 oz (82.5 kg)     Health Maintenance Due  Topic Date Due   COVID-19 Vaccine (4 - Booster for Pfizer series) 05/23/2020    There are no preventive care reminders to display for this patient.  Lab Results  Component Value Date  TSH 1.390 07/12/2020   Lab Results  Component Value Date   WBC 5.6 09/13/2020   HGB 15.4 09/13/2020   HCT 44.9 09/13/2020   MCV 92 09/13/2020   PLT 265 09/13/2020   Lab Results  Component Value Date   NA 141 09/13/2020   K 4.5 09/13/2020   CO2 26 09/13/2020   GLUCOSE 89 09/13/2020   BUN 17 09/13/2020   CREATININE 0.67 09/13/2020   BILITOT 0.5 09/13/2020   ALKPHOS 156 (H) 09/13/2020   AST 26 09/13/2020   ALT 26 09/13/2020   PROT 7.3 09/13/2020   ALBUMIN 4.8 09/13/2020   CALCIUM 9.8 09/13/2020   ANIONGAP 6 07/04/2020   EGFR 94 09/13/2020   Lab Results  Component Value Date   CHOL 138 05/27/2019   Lab Results  Component Value Date   HDL 53 05/27/2019   Lab Results  Component Value Date   LDLCALC 66 05/27/2019   Lab Results  Component Value Date   TRIG 106 05/27/2019   Lab Results  Component Value Date   CHOLHDL 2.6 05/27/2019   Lab Results  Component Value Date   HGBA1C 5.9 07/12/2020      Assessment & Plan:   Problem List Items Addressed This Visit       Other   Dizziness - Primary    Chronic, ongoing. Per neurology's note, it is most likely from facial nerve impairment with Ramsay Hunt Syndrome. Prior MRI from August 5 reviewed which shows improvement in the left facial nerve, however not complete resolution. She has tried prednisone, meclizine, and vestibular rehab with no improvement. Encouraged her to reach out to her neurologist and schedule a visit.       No orders of the defined types were placed in this encounter.   Follow-up: Return in about 4 weeks (around 01/01/2021) for 1-2 months  for physical with Dr. Neomia Dear.    Charyl Dancer, NP

## 2020-12-07 ENCOUNTER — Ambulatory Visit: Payer: Medicare HMO | Admitting: Internal Medicine

## 2020-12-21 DIAGNOSIS — G510 Bell's palsy: Secondary | ICD-10-CM | POA: Insufficient documentation

## 2021-02-03 ENCOUNTER — Encounter: Payer: Self-pay | Admitting: Internal Medicine

## 2021-02-05 NOTE — Telephone Encounter (Signed)
Noted  

## 2021-02-06 ENCOUNTER — Other Ambulatory Visit: Payer: Self-pay

## 2021-02-06 ENCOUNTER — Ambulatory Visit (INDEPENDENT_AMBULATORY_CARE_PROVIDER_SITE_OTHER): Payer: Medicare HMO | Admitting: Internal Medicine

## 2021-02-06 ENCOUNTER — Encounter: Payer: Self-pay | Admitting: Internal Medicine

## 2021-02-06 VITALS — BP 130/84 | HR 73 | Temp 98.0°F | Ht 62.21 in | Wt 180.6 lb

## 2021-02-06 DIAGNOSIS — B351 Tinea unguium: Secondary | ICD-10-CM | POA: Insufficient documentation

## 2021-02-06 DIAGNOSIS — Z0000 Encounter for general adult medical examination without abnormal findings: Secondary | ICD-10-CM | POA: Diagnosis not present

## 2021-02-06 LAB — MICROSCOPIC EXAMINATION: RBC, Urine: NONE SEEN /hpf (ref 0–2)

## 2021-02-06 LAB — URINALYSIS, ROUTINE W REFLEX MICROSCOPIC
Bilirubin, UA: NEGATIVE
Glucose, UA: NEGATIVE
Ketones, UA: NEGATIVE
Nitrite, UA: NEGATIVE
Protein,UA: NEGATIVE
RBC, UA: NEGATIVE
Specific Gravity, UA: >1.03 — ABNORMAL HIGH (ref 1.005–1.030)
Urobilinogen, Ur: 0.2 mg/dL (ref 0.2–1.0)
pH, UA: 5.5 (ref 5.0–7.5)

## 2021-02-06 NOTE — Progress Notes (Signed)
BP 130/84    Pulse 73    Temp 98 F (36.7 C) (Oral)    Ht 5' 2.21" (1.58 m)    Wt 180 lb 9.6 oz (81.9 kg)    SpO2 98%    BMI 32.81 kg/m    Subjective:    Patient ID: Abigail Delacruz, female    DOB: 1950/03/25, 70 y.o.   MRN: [id number]  Chief Complaint  Patient presents with   Annual Exam    HPI: Abigail Delacruz is a 70 y.o. female  Pt is here for a physical  Has had onychomycosis on her lower ext    Chief Complaint  Patient presents with   Annual Exam    Relevant past medical, surgical, family and social history reviewed and updated as indicated. Interim medical history since our last visit reviewed. Allergies and medications reviewed and updated.  Review of Systems  Per HPI unless specifically indicated above     Objective:    BP 130/84    Pulse 73    Temp 98 F (36.7 C) (Oral)    Ht 5' 2.21" (1.58 m)    Wt 180 lb 9.6 oz (81.9 kg)    SpO2 98%    BMI 32.81 kg/m   Wt Readings from Last 3 Encounters:  02/06/21 180 lb 9.6 oz (81.9 kg)  12/04/20 181 lb 3.2 oz (82.2 kg)  10/18/20 183 lb 9.6 oz (83.3 kg)    Physical Exam  Results for orders placed or performed in visit on 09/13/20  CBC with Differential/Platelet  Result Value Ref Range   WBC 5.6 3.4 - 10.8 x10E3/uL   RBC 4.87 3.77 - 5.28 x10E6/uL   Hemoglobin 15.4 11.1 - 15.9 g/dL   Hematocrit 44.9 34.0 - 46.6 %   MCV 92 79 - 97 fL   MCH 31.6 26.6 - 33.0 pg   MCHC 34.3 31.5 - 35.7 g/dL   RDW 13.2 11.7 - 15.4 %   Platelets 265 150 - 450 x10E3/uL   Neutrophils 55 Not Estab. %   Lymphs 36 Not Estab. %   Monocytes 7 Not Estab. %   Eos 1 Not Estab. %   Basos 1 Not Estab. %   Neutrophils Absolute 3.0 1.4 - 7.0 x10E3/uL   Lymphocytes Absolute 2.0 0.7 - 3.1 x10E3/uL   Monocytes Absolute 0.4 0.1 - 0.9 x10E3/uL   EOS (ABSOLUTE) 0.1 0.0 - 0.4 x10E3/uL   Basophils Absolute 0.0 0.0 - 0.2 x10E3/uL   Immature Granulocytes 0 Not Estab. %   Immature Grans (Abs) 0.0 0.0 - 0.1 x10E3/uL  Comprehensive metabolic panel   Result Value Ref Range   Glucose 89 65 - 99 mg/dL   BUN 17 8 - 27 mg/dL   Creatinine, Ser 0.67 0.57 - 1.00 mg/dL   eGFR 94 >59 mL/min/1.73   BUN/Creatinine Ratio 25 12 - 28   Sodium 141 134 - 144 mmol/L   Potassium 4.5 3.5 - 5.2 mmol/L   Chloride 100 96 - 106 mmol/L   CO2 26 20 - 29 mmol/L   Calcium 9.8 8.7 - 10.3 mg/dL   Total Protein 7.3 6.0 - 8.5 g/dL   Albumin 4.8 3.8 - 4.8 g/dL   Globulin, Total 2.5 1.5 - 4.5 g/dL   Albumin/Globulin Ratio 1.9 1.2 - 2.2   Bilirubin Total 0.5 0.0 - 1.2 mg/dL   Alkaline Phosphatase 156 (H) 44 - 121 IU/L   AST 26 0 - 40 IU/L   ALT 26 0 - 32 IU/L  Current Outpatient Medications:    Multiple Vitamins-Minerals (MULTIVITAMIN ADULT) CHEW, Chew by mouth., Disp: , Rfl:    terbinafine (LAMISIL) 250 MG tablet, Take by mouth., Disp: , Rfl:    atorvastatin (LIPITOR) 20 MG tablet, Take 1 tablet (20 mg total) by mouth daily. (Patient not taking: Reported on 02/06/2021), Disp: 90 tablet, Rfl: 0    Assessment & Plan:  PHYSICAL :  Physical Wnl will check CMP, FLP, CBC,TSH  HLD :  Was on lipitor 20 mg  recheck FLP, check LFT's work on diet, SE of meds explained to pt. low fat and high fiber diet explained to pt.   Onychomycosis is on terbinafine to fu with podiatry on Thursday Soaking with epsom salt   Problem List Items Addressed This Visit   None Visit Diagnoses     Annual physical exam    -  Primary   Relevant Orders   TSH   Lipid panel   CBC with Differential/Platelet   Comprehensive metabolic panel   Urinalysis, Routine w reflex microscopic        Orders Placed This Encounter  Procedures   TSH   Lipid panel   CBC with Differential/Platelet   Comprehensive metabolic panel   Urinalysis, Routine w reflex microscopic     No orders of the defined types were placed in this encounter.    Follow up plan: No follow-ups on file.

## 2021-02-07 LAB — CBC WITH DIFFERENTIAL/PLATELET
Basophils Absolute: 0.1 10*3/uL (ref 0.0–0.2)
Basos: 1 %
EOS (ABSOLUTE): 0.2 10*3/uL (ref 0.0–0.4)
Eos: 4 %
Hematocrit: 45.5 % (ref 34.0–46.6)
Hemoglobin: 14.8 g/dL (ref 11.1–15.9)
Immature Grans (Abs): 0 10*3/uL (ref 0.0–0.1)
Immature Granulocytes: 0 %
Lymphocytes Absolute: 2 10*3/uL (ref 0.7–3.1)
Lymphs: 36 %
MCH: 30.1 pg (ref 26.6–33.0)
MCHC: 32.5 g/dL (ref 31.5–35.7)
MCV: 93 fL (ref 79–97)
Monocytes Absolute: 0.4 10*3/uL (ref 0.1–0.9)
Monocytes: 7 %
Neutrophils Absolute: 2.7 10*3/uL (ref 1.4–7.0)
Neutrophils: 52 %
Platelets: 273 10*3/uL (ref 150–450)
RBC: 4.92 x10E6/uL (ref 3.77–5.28)
RDW: 12.8 % (ref 11.7–15.4)
WBC: 5.4 10*3/uL (ref 3.4–10.8)

## 2021-02-07 LAB — COMPREHENSIVE METABOLIC PANEL
ALT: 23 IU/L (ref 0–32)
AST: 22 IU/L (ref 0–40)
Albumin/Globulin Ratio: 2.1 (ref 1.2–2.2)
Albumin: 4.6 g/dL (ref 3.8–4.8)
Alkaline Phosphatase: 133 IU/L — ABNORMAL HIGH (ref 44–121)
BUN/Creatinine Ratio: 30 — ABNORMAL HIGH (ref 12–28)
BUN: 20 mg/dL (ref 8–27)
Bilirubin Total: 0.4 mg/dL (ref 0.0–1.2)
CO2: 26 mmol/L (ref 20–29)
Calcium: 9.3 mg/dL (ref 8.7–10.3)
Chloride: 101 mmol/L (ref 96–106)
Creatinine, Ser: 0.67 mg/dL (ref 0.57–1.00)
Globulin, Total: 2.2 g/dL (ref 1.5–4.5)
Glucose: 86 mg/dL (ref 70–99)
Potassium: 4.1 mmol/L (ref 3.5–5.2)
Sodium: 141 mmol/L (ref 134–144)
Total Protein: 6.8 g/dL (ref 6.0–8.5)
eGFR: 94 mL/min/{1.73_m2} (ref >59)

## 2021-02-07 LAB — LIPID PANEL
Chol/HDL Ratio: 4.7 ratio — ABNORMAL HIGH (ref 0.0–4.4)
Cholesterol, Total: 254 mg/dL — ABNORMAL HIGH (ref 100–199)
HDL: 54 mg/dL (ref >39)
LDL Chol Calc (NIH): 167 mg/dL — ABNORMAL HIGH (ref 0–99)
Triglycerides: 179 mg/dL — ABNORMAL HIGH (ref 0–149)
VLDL Cholesterol Cal: 33 mg/dL (ref 5–40)

## 2021-02-07 LAB — TSH: TSH: 2.09 u[IU]/mL (ref 0.450–4.500)

## 2021-02-10 ENCOUNTER — Encounter: Payer: Self-pay | Admitting: Internal Medicine

## 2021-02-10 ENCOUNTER — Ambulatory Visit: Payer: Self-pay

## 2021-02-10 NOTE — Telephone Encounter (Signed)
Dr. Pete Glatter. Abigail Delacruz DPM prescribed terbinafine (LAMISIL) 250 MG tablet due to patient having an ingrown toenail. Podiatrist advised patient to ask PCP if cholesterol is high PCP might want to d/c and if not high patient would like PCP to prescribe  .  atorvastatin (LIPITOR) 20 MG tablet. Patient stated she physically went in the office 72 hours ago and she has not received a response. Patient would like a follow up call today.   Called pt back 02/10/21 and pt very frustrated that she did not receive a call back after several requests for call back to discuss new medication and was upset about no one called her about her lipid panel or discussed pt concerns.  Pt would like to complain about her experience with office. Provided pt with number to Patient Experience at [phone]. Sending message high priority to office.     Answer Assessment - Initial Assessment Questions 1. REASON FOR CALL or QUESTION: "What is your reason for calling today?" or "How can I best help you?" or "What question do you have that I can help answer?"    Please see note- pt very upset that no one called her back after 2 phone calls and an inquiry made at the PCP office.  Answer Assessment - Initial Assessment Questions 1. NAME of MEDICATION: "What medicine are you calling about?"     terbinafine 2. QUESTION: "What is your question?" (e.g., double dose of medicine, side effect)     Whether or not she can stop the statin and if it is safe to take terbinafine and dc and stop statin 3. PRESCRIBING HCP: "Who prescribed it?" Reason: if prescribed by specialist, call should be referred to that group.     Dr Abigail Delacruz foot doctor  Protocols used: Information Only Call - No Triage-A-AH, Medication Question Call-A-AH

## 2021-02-12 ENCOUNTER — Ambulatory Visit: Payer: Self-pay | Admitting: *Deleted

## 2021-02-12 DIAGNOSIS — E782 Mixed hyperlipidemia: Secondary | ICD-10-CM

## 2021-02-12 MED ORDER — ATORVASTATIN CALCIUM 20 MG PO TABS: 20.0000 mg | ORAL_TABLET | Freq: Every day | ORAL | 0 refills | Status: DC

## 2021-02-12 NOTE — Telephone Encounter (Signed)
Pt given message per notes of Dr. Neomia Dear from 02/12/21 at 9:29 am  on 02/12/21. Pt verbalized understanding she can take lipitor with lamisil. Patient requesting a call back when she is to have recheck of fasting lipid profile and CMP. Requesting refill of lipitor.

## 2021-02-12 NOTE — Telephone Encounter (Signed)
Patient made aware of results and verbalized understanding.   Patient wants to inform you that she has an appt with Dr. Vickki Muff on the 23rd and will have lab work.

## 2021-03-30 DIAGNOSIS — H26491 Other secondary cataract, right eye: Secondary | ICD-10-CM | POA: Diagnosis not present

## 2021-04-02 DIAGNOSIS — R42 Dizziness and giddiness: Secondary | ICD-10-CM | POA: Diagnosis not present

## 2021-04-02 DIAGNOSIS — H9202 Otalgia, left ear: Secondary | ICD-10-CM | POA: Diagnosis not present

## 2021-04-02 DIAGNOSIS — R29810 Facial weakness: Secondary | ICD-10-CM | POA: Diagnosis not present

## 2021-04-02 DIAGNOSIS — B0221 Postherpetic geniculate ganglionitis: Secondary | ICD-10-CM | POA: Diagnosis not present

## 2021-04-02 DIAGNOSIS — G510 Bell's palsy: Secondary | ICD-10-CM | POA: Diagnosis not present

## 2021-04-09 ENCOUNTER — Telehealth (INDEPENDENT_AMBULATORY_CARE_PROVIDER_SITE_OTHER): Payer: No Typology Code available for payment source | Admitting: Internal Medicine

## 2021-04-09 ENCOUNTER — Encounter: Payer: Self-pay | Admitting: Internal Medicine

## 2021-04-09 DIAGNOSIS — R059 Cough, unspecified: Secondary | ICD-10-CM

## 2021-04-09 DIAGNOSIS — R197 Diarrhea, unspecified: Secondary | ICD-10-CM

## 2021-04-09 LAB — VERITOR FLU A/B WAIVED
Influenza A: NEGATIVE
Influenza B: NEGATIVE

## 2021-04-09 MED ORDER — BENZONATATE 100 MG PO CAPS: 100.0000 mg | ORAL_CAPSULE | Freq: Two times a day (BID) | ORAL | 0 refills | Status: DC | PRN

## 2021-04-09 MED ORDER — ALBUTEROL SULFATE HFA 108 (90 BASE) MCG/ACT IN AERS: 2.0000 | INHALATION_SPRAY | Freq: Four times a day (QID) | RESPIRATORY_TRACT | 0 refills | Status: DC | PRN

## 2021-04-09 MED ORDER — METHYLPREDNISOLONE 4 MG PO TBPK: ORAL_TABLET | ORAL | 0 refills | Status: DC

## 2021-04-09 NOTE — Progress Notes (Signed)
There were no vitals taken for this visit.   Subjective:    Patient ID: Abigail Delacruz, female    DOB: [birth year], 71 y.o.   MRN: [id number]  Chief Complaint  Patient presents with   URI    Pt states she has been having a cough, congestion, and diarrhea since Friday.     HPI: Abigail Delacruz is a 71 y.o. female   This visit was completed via telephone due to the restrictions of the COVID-19 pandemic. All issues as above were discussed and addressed but no physical exam was performed. If it was felt that the patient should be evaluated in the office, they were directed there. The patient verbally consented to this visit. Patient was unable to complete an audio/visual visit due to Technical difficulties. Due to the catastrophic nature of the COVID-19 pandemic, this visit was done through audio contact only. Location of the patient: home Location of the provider: work Those involved with this call:  Provider: Loura Pardon, MD CMA: Tristan Schroeder, CMA Front Desk/Registration: [employer]  Time spent on call: 10 minutes on the phone discussing health concerns. 10 minutes total spent in review of patient's record and preparation of their chart.  Patient presents with: URI: Pt states she has been having a cough, congestion, and diarrhea since Friday.     URI  This is a new problem. The current episode started more than 1 year ago. Associated symptoms include coughing, diarrhea and wheezing. Pertinent negatives include no abdominal pain, chest pain, congestion, dysuria, ear pain, headaches, joint pain, joint swelling, nausea, neck pain, plugged ear sensation, rash, rhinorrhea, sinus pain, sneezing, sore throat, swollen glands or vomiting.  Cough This is a new (light green phlegm, no fever or chills.) problem. The current episode started yesterday. Associated symptoms include postnasal drip and wheezing. Pertinent negatives include no chest pain, chills, ear congestion, ear pain, fever,  headaches, hemoptysis, myalgias, nasal congestion, rash, rhinorrhea, sore throat, shortness of breath, sweats or weight loss.  Diarrhea  This is a new (x 3 times loose no watery, no blood) problem. The current episode started yesterday. Associated symptoms include coughing and a URI. Pertinent negatives include no abdominal pain, chills, fever, headaches, myalgias, sweats, vomiting or weight loss.   Chief Complaint  Patient presents with   URI    Pt states she has been having a cough, congestion, and diarrhea since Friday.     Relevant past medical, surgical, family and social history reviewed and updated as indicated. Interim medical history since our last visit reviewed. Allergies and medications reviewed and updated.  Review of Systems  Constitutional:  Negative for chills, fever and weight loss.  HENT:  Positive for postnasal drip. Negative for congestion, ear pain, rhinorrhea, sinus pain, sneezing and sore throat.   Respiratory:  Positive for cough and wheezing. Negative for hemoptysis and shortness of breath.   Cardiovascular:  Negative for chest pain.  Gastrointestinal:  Positive for diarrhea. Negative for abdominal pain, nausea and vomiting.  Genitourinary:  Negative for dysuria.  Musculoskeletal:  Negative for joint pain, myalgias and neck pain.  Skin:  Negative for rash.  Neurological:  Negative for headaches.   Per HPI unless specifically indicated above     Objective:    There were no vitals taken for this visit.  Wt Readings from Last 3 Encounters:  02/06/21 180 lb 9.6 oz (81.9 kg)  12/04/20 181 lb 3.2 oz (82.2 kg)  10/18/20 183 lb 9.6 oz (83.3 kg)  Physical Exam  Results for orders placed or performed in visit on 04/09/21  Novel Coronavirus, NAA ([facility])   Specimen: Nasopharyngeal(NP) swabs in vial transport medium  Result Value Ref Range   SARS-CoV-2, NAA Not Detected Not Detected  Veritor Flu A/B Waived  Result Value Ref Range   Influenza A Negative  Negative   Influenza B Negative Negative        Current Outpatient Medications:    albuterol (VENTOLIN HFA) 108 (90 Base) MCG/ACT inhaler, Inhale 2 puffs into the lungs every 6 (six) hours as needed for wheezing or shortness of breath., Disp: 8 g, Rfl: 0   atorvastatin (LIPITOR) 20 MG tablet, Take 1 tablet (20 mg total) by mouth daily., Disp: 90 tablet, Rfl: 0   benzonatate (TESSALON) 100 MG capsule, Take 1 capsule (100 mg total) by mouth 2 (two) times daily as needed for cough., Disp: 20 capsule, Rfl: 0   methylPREDNISolone (MEDROL DOSEPAK) 4 MG TBPK tablet, Use as directed, Disp: 1 each, Rfl: 0   Multiple Vitamins-Minerals (MULTIVITAMIN ADULT) CHEW, Chew by mouth., Disp: , Rfl:     Assessment & Plan:  URI: Flu and strep ordered at this visit, both negative. pt advised to take Tylenol q 4- 6 hourly as needed. pt to take allegra q pm as needed and to call office if symptoms worsened pt verbalised understanding of such.    Problem List Items Addressed This Visit   None Visit Diagnoses     Diarrhea, unspecified type    -  Primary   Relevant Orders   Veritor Flu A/B Waived (Completed)   Novel Coronavirus, NAA ([facility]) (Completed)   Cough, unspecified type       Relevant Orders   Veritor Flu A/B Waived (Completed)   Novel Coronavirus, NAA ([facility]) (Completed)        Orders Placed This Encounter  Procedures   Novel Coronavirus, NAA ([facility])   Veritor Flu A/B Waived     Meds ordered this encounter  Medications   albuterol (VENTOLIN HFA) 108 (90 Base) MCG/ACT inhaler    Sig: Inhale 2 puffs into the lungs every 6 (six) hours as needed for wheezing or shortness of breath.    Dispense:  8 g    Refill:  0   methylPREDNISolone (MEDROL DOSEPAK) 4 MG TBPK tablet    Sig: Use as directed    Dispense:  1 each    Refill:  0   benzonatate (TESSALON) 100 MG capsule    Sig: Take 1 capsule (100 mg total) by mouth 2 (two) times daily as needed for cough.    Dispense:  20 capsule     Refill:  0     Follow up plan: No follow-ups on file.

## 2021-04-10 LAB — NOVEL CORONAVIRUS, NAA: SARS-CoV-2, NAA: NOT DETECTED

## 2021-04-11 ENCOUNTER — Other Ambulatory Visit (HOSPITAL_COMMUNITY): Payer: Self-pay | Admitting: Neurology

## 2021-04-11 ENCOUNTER — Other Ambulatory Visit: Payer: Self-pay | Admitting: Neurology

## 2021-04-11 DIAGNOSIS — G510 Bell's palsy: Secondary | ICD-10-CM

## 2021-04-11 DIAGNOSIS — R29810 Facial weakness: Secondary | ICD-10-CM

## 2021-04-11 DIAGNOSIS — B0221 Postherpetic geniculate ganglionitis: Secondary | ICD-10-CM

## 2021-04-11 DIAGNOSIS — R42 Dizziness and giddiness: Secondary | ICD-10-CM

## 2021-04-11 NOTE — Progress Notes (Signed)
Please let pt know this was normal.

## 2021-04-23 ENCOUNTER — Other Ambulatory Visit: Payer: Self-pay

## 2021-04-23 ENCOUNTER — Ambulatory Visit
Admission: RE | Admit: 2021-04-23 | Discharge: 2021-04-23 | Disposition: A | Discharge status: Home / Self Care | Payer: No Typology Code available for payment source | Source: Ambulatory Visit | Attending: Neurology | Admitting: Neurology

## 2021-04-23 DIAGNOSIS — R29810 Facial weakness: Secondary | ICD-10-CM | POA: Insufficient documentation

## 2021-04-23 DIAGNOSIS — G510 Bell's palsy: Secondary | ICD-10-CM | POA: Diagnosis not present

## 2021-04-23 DIAGNOSIS — R42 Dizziness and giddiness: Secondary | ICD-10-CM | POA: Diagnosis not present

## 2021-04-23 DIAGNOSIS — B0221 Postherpetic geniculate ganglionitis: Secondary | ICD-10-CM | POA: Diagnosis not present

## 2021-04-23 IMAGING — MR MR HEAD WO/W CM
9 of 14 series · 25 of 48 positions shown · IV contrast (7.5ml Gadavist)
Comparison: Brain MRI [DATE], [DATE].

CLINICAL DATA: 71-year-old female with ISA syndrome.
Dizziness since last year. No known injury.

EXAM:
MRI HEAD WITHOUT AND WITH CONTRAST
TECHNIQUE: Multiplanar, multiecho pulse sequences of the brain and surrounding
structures were obtained without and with intravenous contrast.
CONTRAST:  7.5mL GADAVIST GADOBUTROL 1 MMOL/ML IV SOLN

[Series 5: T1 · sagittal · 5.0mm · 0.62mm/px · 2 of 25 slices shown (1 of 3)]
[im 1/25]
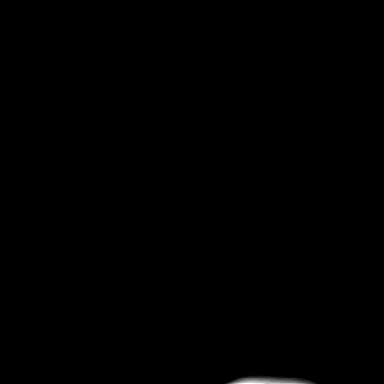
[im 25/25]
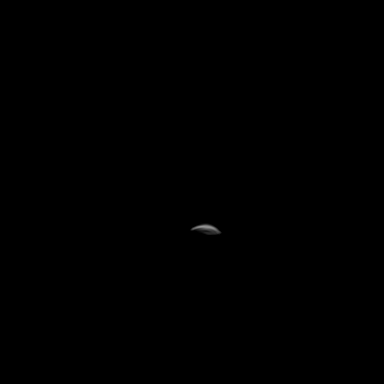

[Series 6: ax dwi_tracew · axial · 3.0mm · 0.65mm/px · z∈[-118,+40]mm · 4 of 50 slices shown]
[im 1/50]
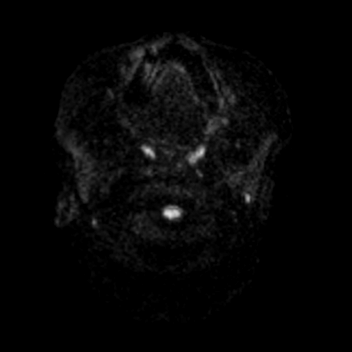
[im 17/50]
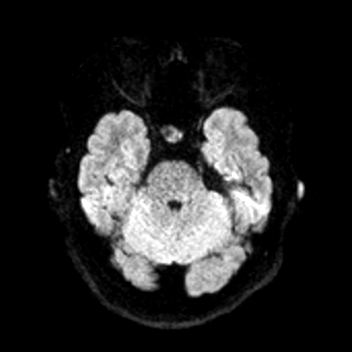
[im 33/50]
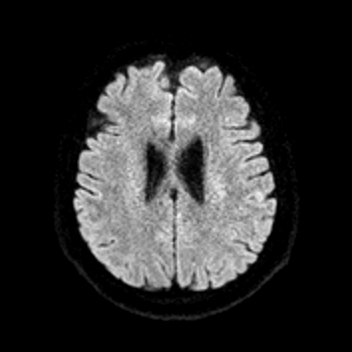
[im 50/50]
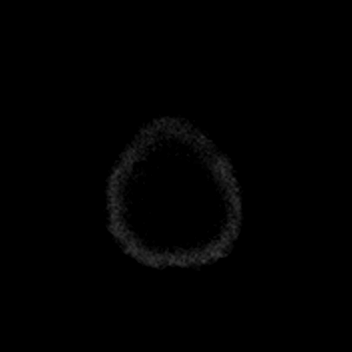

[Series 8: T2 · axial · 5.0mm · 0.53mm/px · z∈[-116,+37]mm · 2 of 27 slices shown]
[im 1/27]
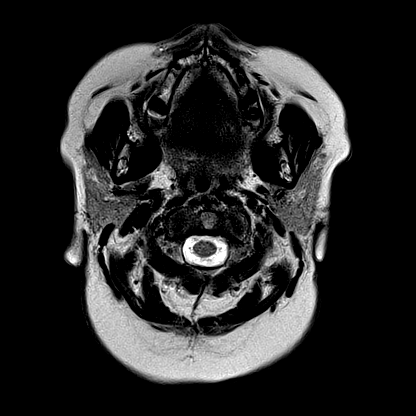
[im 27/27]
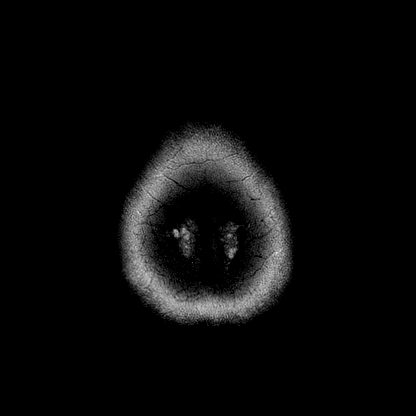

[Series 13: FLAIR · axial · 3.0mm · 0.53mm/px · z∈[-119,+40]mm · 4 of 55 slices shown]
[im 1/55]
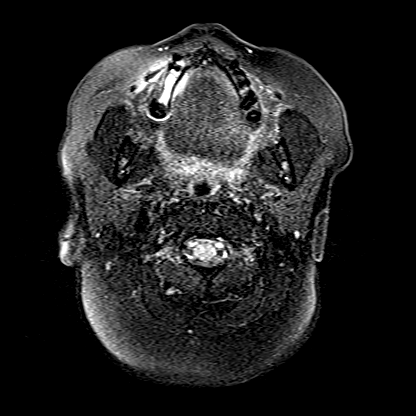
[im 19/55]
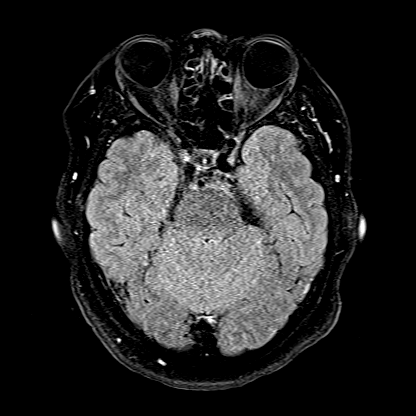
[im 37/55]
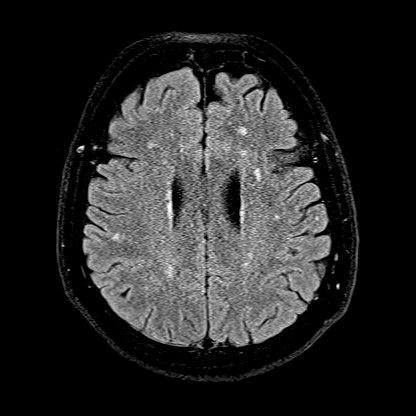
[im 55/55]
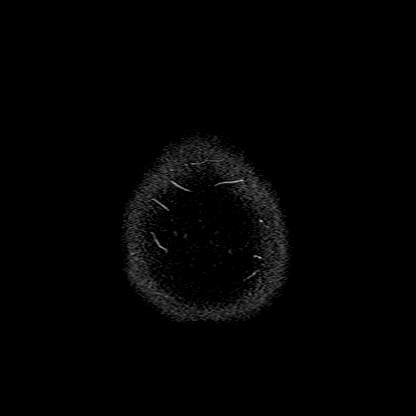

[Series 14: T1 · coronal · non-contrast · 3.0mm · 0.21mm/px · 1 of 13 slices shown (2 of 3)]
[im 1/13]
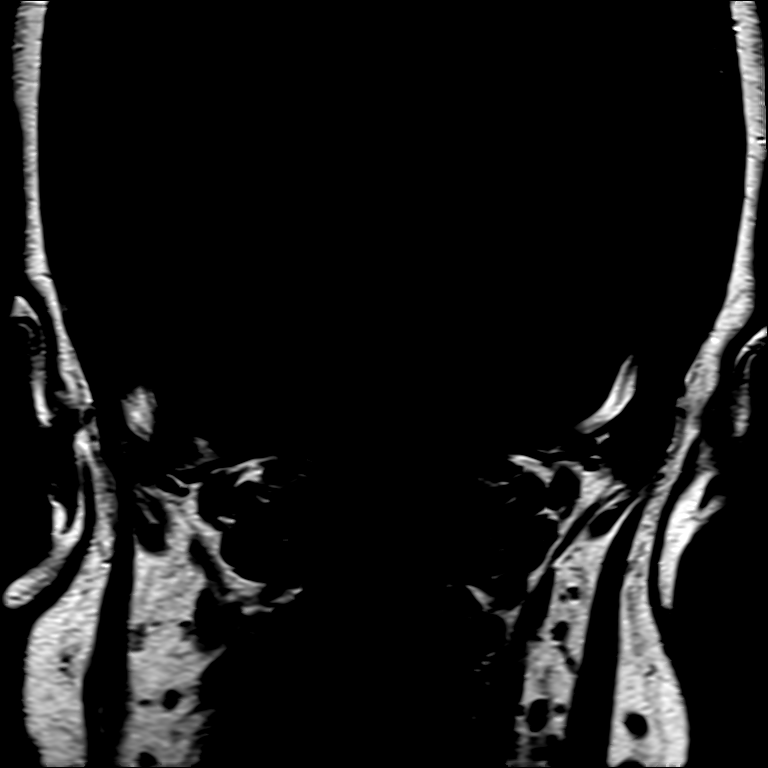

[Series 16: T1 · axial · non-contrast · 3.0mm · 0.21mm/px · 1 of 15 slices shown (3 of 3)]
[im 1/15]
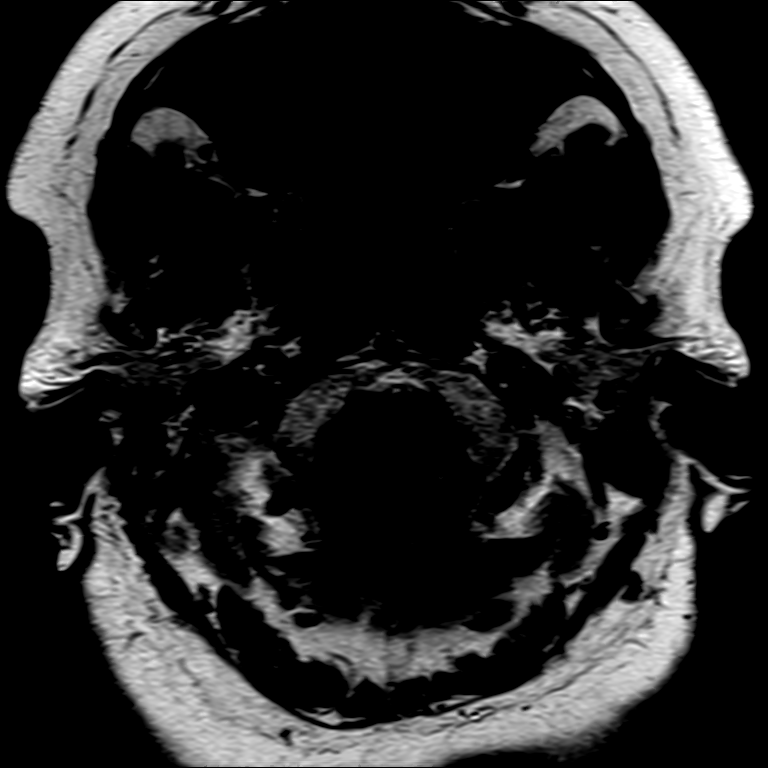

[Series 17: T1 post-contrast · axial · 3.0mm · 0.21mm/px · 1 of 15 slices shown (1 of 3)]
[im 1/15]
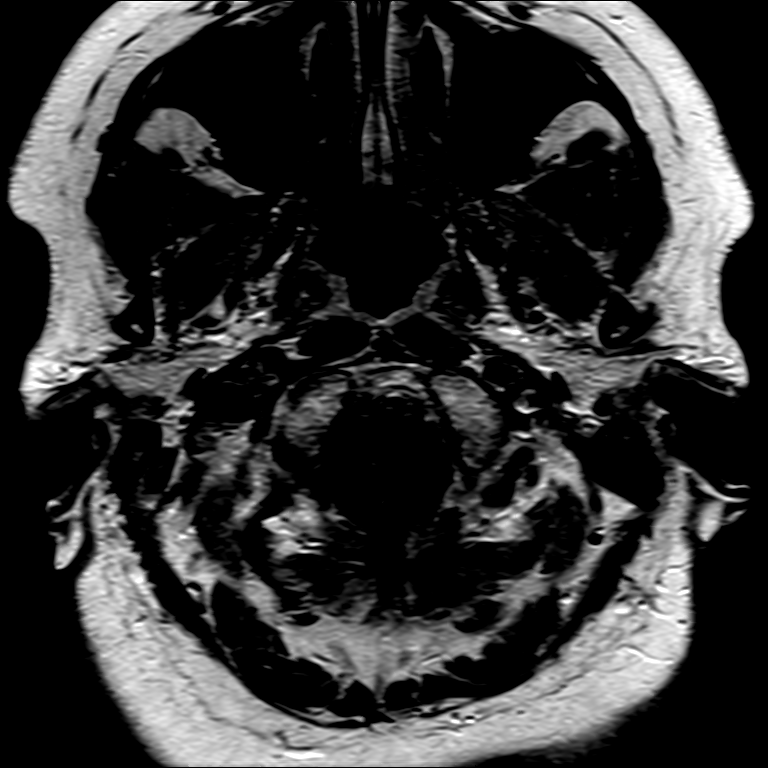

[Series 18: T1 post-contrast · coronal · 3.0mm · 0.21mm/px · 1 of 13 slices shown (2 of 3)]
[im 1/13]
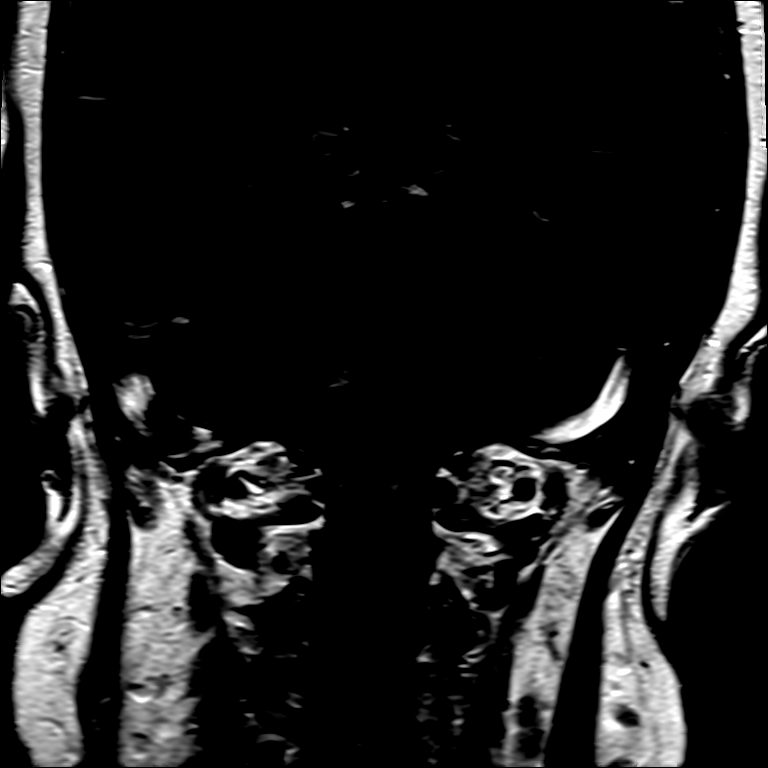

[Series 19: T1 post-contrast · axial · 1.0mm · 0.98mm/px · z∈[-123,+49]mm · 9 of 176 slices shown (3 of 3)]
[im 1/176]
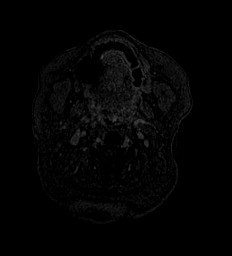
[im 32/176]
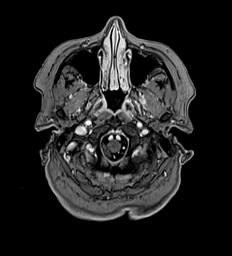
[im 48/176]
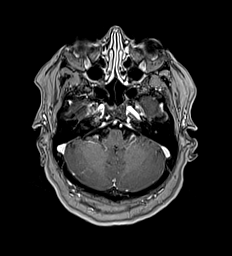
[im 80/176]
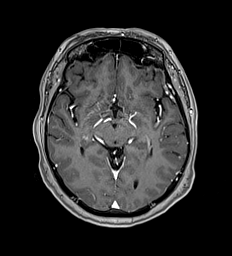
[im 96/176]
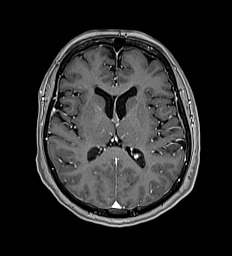
[im 128/176]
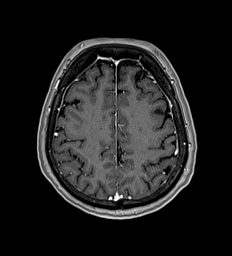
[im 144/176]
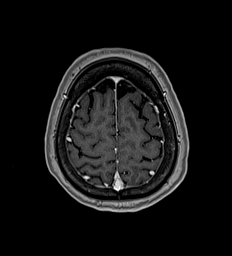
[im 160/176]
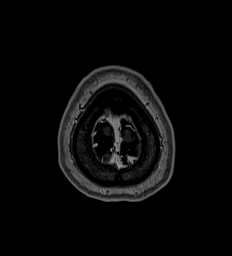
[im 176/176]
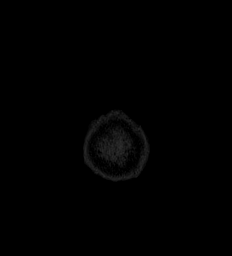

[25 of 48 positions shown; findings below may reference images not displayed]

FINDINGS: Brain: Cerebral volume is within normal limits. No restricted
diffusion to suggest acute infarction. No midline shift, mass
effect, evidence of mass lesion, ventriculomegaly, extra-axial
collection or acute intracranial hemorrhage. Cervicomedullary
junction and pituitary are within normal limits. Incidental choroid
plexus cysts, normal variant.

Patchy and scattered bilateral cerebral white matter T2 and FLAIR
hyperintensity is stable since [REDACTED], mild to moderate
for age and nonspecific. No superimposed cortical encephalomalacia
or chronic cerebral blood products. Deep gray nuclei, brainstem, and
cerebellum remain within normal limits.

Outside of the left internal auditory findings (below), no abnormal
intracranial enhancement or dural thickening is identified.

Vascular: Major intracranial vascular flow voids are stable.

Skull and upper cervical spine: Negative visible cervical spine,
bone marrow signal.

Sinuses/Orbits: Stable and negative.

Other: Dedicated thin slice internal auditory imaging. Normal
bilateral cerebellopontine angles. Normal cisternal cranial nerve
segments. On precontrast imaging there is a low signal filling
defect near the fundus of the left IAC (series 15, image 31), with
preserved T2 signal in the bilateral cochlea and vestibular
structures. Following contrast there is associated discrete nodular
enhancement there (series 19, image 53 and series 17, image 8) which
has regressed in size and intensity compared to the MRI last year.
And there is no longer contiguous enhancement of the left 7th nerve
labyrinthine segment. Enhancement at the anterior geniculate
ganglion and tympanic segment also appear regressed since [REDACTED]. Left tympanic cavity and mastoids appear to remain
normally aerated. No convincing abnormal soft tissue at the left
stylomastoid foramen. Left parotid gland appears to remain normal.

No contralateral abnormal right middle ear or mastoid enhancement.
IMPRESSION: 1. Further regressed but not resolved abnormal nodular appearance
and enhancement of the left 7th nerve at the IAC fundus. Evolution
since [REDACTED] does favor a neuritis over a discrete
neurogenic tumor. Other internal auditory imaging remains normal.

2. No new intracranial abnormality. Mild to moderate for age
nonspecific cerebral white matter signal changes.

## 2021-04-23 MED ORDER — GADOBUTROL 1 MMOL/ML IV SOLN
7.5000 mL | Freq: Once | INTRAVENOUS | Status: AC | PRN
  Administered 2021-04-23: 7.5 mL via INTRAVENOUS

## 2021-05-02 ENCOUNTER — Other Ambulatory Visit: Payer: No Typology Code available for payment source

## 2021-05-02 ENCOUNTER — Other Ambulatory Visit: Payer: Self-pay

## 2021-05-02 DIAGNOSIS — E782 Mixed hyperlipidemia: Secondary | ICD-10-CM | POA: Diagnosis not present

## 2021-05-03 DIAGNOSIS — H26491 Other secondary cataract, right eye: Secondary | ICD-10-CM | POA: Diagnosis not present

## 2021-05-03 LAB — COMPREHENSIVE METABOLIC PANEL
ALT: 45 IU/L — ABNORMAL HIGH (ref 0–32)
AST: 32 IU/L (ref 0–40)
Albumin/Globulin Ratio: 2 (ref 1.2–2.2)
Albumin: 4.3 g/dL (ref 3.7–4.7)
Alkaline Phosphatase: 143 IU/L — ABNORMAL HIGH (ref 44–121)
BUN/Creatinine Ratio: 35 — ABNORMAL HIGH (ref 12–28)
BUN: 23 mg/dL (ref 8–27)
Bilirubin Total: 0.5 mg/dL (ref 0.0–1.2)
CO2: 23 mmol/L (ref 20–29)
Calcium: 8.9 mg/dL (ref 8.7–10.3)
Chloride: 102 mmol/L (ref 96–106)
Creatinine, Ser: 0.66 mg/dL (ref 0.57–1.00)
Globulin, Total: 2.2 g/dL (ref 1.5–4.5)
Glucose: 98 mg/dL (ref 70–99)
Potassium: 4 mmol/L (ref 3.5–5.2)
Sodium: 145 mmol/L — ABNORMAL HIGH (ref 134–144)
Total Protein: 6.5 g/dL (ref 6.0–8.5)
eGFR: 94 mL/min/{1.73_m2} (ref >59)

## 2021-05-03 LAB — LIPID PANEL
Chol/HDL Ratio: 3.9 ratio (ref 0.0–4.4)
Cholesterol, Total: 184 mg/dL (ref 100–199)
HDL: 47 mg/dL (ref >39)
LDL Chol Calc (NIH): 112 mg/dL — ABNORMAL HIGH (ref 0–99)
Triglycerides: 139 mg/dL (ref 0–149)
VLDL Cholesterol Cal: 25 mg/dL (ref 5–40)

## 2021-05-09 ENCOUNTER — Ambulatory Visit (INDEPENDENT_AMBULATORY_CARE_PROVIDER_SITE_OTHER): Payer: No Typology Code available for payment source | Admitting: Internal Medicine

## 2021-05-09 ENCOUNTER — Other Ambulatory Visit: Payer: Self-pay

## 2021-05-09 ENCOUNTER — Encounter: Payer: Self-pay | Admitting: Internal Medicine

## 2021-05-09 VITALS — BP 116/72 | HR 74 | Temp 98.1°F | Ht 62.21 in | Wt 187.6 lb

## 2021-05-09 DIAGNOSIS — R748 Abnormal levels of other serum enzymes: Secondary | ICD-10-CM | POA: Diagnosis not present

## 2021-05-09 DIAGNOSIS — E782 Mixed hyperlipidemia: Secondary | ICD-10-CM | POA: Diagnosis not present

## 2021-05-09 DIAGNOSIS — K7689 Other specified diseases of liver: Secondary | ICD-10-CM

## 2021-05-09 MED ORDER — ATORVASTATIN CALCIUM 40 MG PO TABS: 40.0000 mg | ORAL_TABLET | Freq: Every day | ORAL | 3 refills | Status: DC

## 2021-05-09 NOTE — Patient Instructions (Signed)
Cholelithiasis ?Cholelithiasis is a disease in which gallstones form in the gallbladder. The gallbladder is an organ that stores bile. Bile is a fluid that helps to digest fats. Gallstones begin as small crystals and can slowly grow into stones. They may cause no symptoms until they block the gallbladder duct, or cystic duct, when the gallbladder tightens (contracts) after food is eaten. This can cause pain and is known as a gallbladder attack, or biliary colic. ?There are two main types of gallstones: ?Cholesterol stones. These are the most common type of gallstone. These stones are made of hardened cholesterol and are usually yellow-green in color. Cholesterol is a fat-like substance that is made in the liver. ?Pigment stones. These are dark in color and are made of a red-yellow substance, called bilirubin,that forms when hemoglobin from red blood cells breaks down. ?What are the causes? ?This condition may be caused by an imbalance in the different parts that make bile. This can happen if the bile: ?Has too much bilirubin. This can happen in certain blood diseases, such as sickle cell anemia. ?Has too much cholesterol. ?Does not have enough bile salts. These salts help the body absorb and digest fats. ?In some cases, this condition can also be caused by the gallbladder not emptying completely or often enough. This is common during pregnancy. ?What increases the risk? ?The following factors may make you more likely to develop this condition: ?Being female. ?Having multiple pregnancies. Health care providers sometimes advise removing diseased gallbladders before future pregnancies. ?Eating a diet that is heavy in fried foods, fat, and refined carbohydrates, such as white bread and white rice. ?Being obese. ?Being older than age 64. ?Using medicines that contain female hormones (estrogen) for a long time. ?Losing weight quickly. ?Having a family history of gallstones. ?Having certain medical problems, such  as: ?Diabetes mellitus. ?Cystic fibrosis. ?Crohn's disease. ?Cirrhosis or other long-term (chronic) liver disease. ?Certain blood diseases, such as sickle cell anemia or leukemia. ?What are the signs or symptoms? ?In many cases, having gallstones causes no symptoms. When you have gallstones but do not have symptoms, you have silent gallstones. If a gallstone blocks your bile duct, it can cause a gallbladder attack. The main symptom of a gallbladder attack is sudden pain in the upper right part of the abdomen. The pain: ?Usually comes at night or after eating. ?Can last for one hour or more. ?Can spread to your right shoulder, back, or chest. ?Can feel like indigestion. This is discomfort, burning, or fullness in your upper abdomen. ?If the bile duct is blocked for more than a few hours, it can cause an infection or inflammation of your gallbladder (cholecystitis), liver, or pancreas. This can cause: ?Nausea or vomiting. ?Bloating. ?Pain in your abdomen that lasts for 5 hours or longer. ?Tenderness in your upper abdomen, often in the upper right section and under your rib cage. ?Fever or chills. ?Skin or the white parts of your eyes turning yellow (jaundice). This usually happens when a stone has blocked bile from passing through the common bile duct. ?Dark urine or light-colored stools. ?How is this diagnosed? ?This condition may be diagnosed based on: ?A physical exam. ?Your medical history. ?Ultrasound. ?CT scan. ?MRI. ?You may also have other tests, including: ?Blood tests to check for signs of an infection or inflammation. ?Cholescintigraphy, or HIDA scan. This is a scan of your gallbladder and bile ducts (biliary system) using non-harmful radioactive material and special cameras that can see the radioactive material. ?Endoscopic retrograde cholangiopancreatogram. This involves  inserting a small tube with a camera on the end (endoscope) through your mouth to look at bile ducts and check for blockages. ?How is  this treated? ?Treatment for this condition depends on the severity of the condition. Silent gallstones do not need treatment. Treatment may be needed if a blockage causes a gallbladder attack or other symptoms. Treatment may include: ?Home care, if symptoms are not severe. ?During a simple gallbladder attack, stop eating and drinking for 12-24 hours (except for water and clear liquids). This helps to "cool down" your gallbladder. After 1 or 2 days, you can start to eat a diet of simple or clear foods, such as broths and crackers. ?You may also need medicines for pain or nausea or both. ?If you have cholecystitis and an infection, you will need antibiotics. ?A hospital stay, if needed for pain control or for cholecystitis with severe infection. ?Cholecystectomy, or surgery to remove your gallbladder. This is the most common treatment if all other treatments have not worked. ?Medicines to break up gallstones. These are most effective at treating small gallstones. Medicines may be used for up to 6-12 months. ?Endoscopic retrograde cholangiopancreatogram. A small basket can be attached to the endoscope and used to capture and remove gallstones, mainly those that are in the common bile duct. ?Follow these instructions at home: ?Medicines ?Take over-the-counter and prescription medicines only as told by your health care provider. ?If you were prescribed an antibiotic medicine, take it as told by your health care provider. Do not stop taking the antibiotic even if you start to feel better. ?Ask your health care provider if the medicine prescribed to you requires you to avoid driving or using machinery. ?Eating and drinking ?Drink enough fluid to keep your urine pale yellow. This is important during a gallbladder attack. Water and clear liquids are preferred. ?Follow a healthy diet. This includes: ?Reducing fatty foods, such as fried food and foods high in cholesterol. ?Reducing refined carbohydrates, such as white bread  and white rice. ?Eating more fiber. Aim for foods such as almonds, fruit, and beans. ?Alcohol use ?If you drink alcohol: ?Limit how much you use to: ?0-1 drink a day for nonpregnant women. ?0-2 drinks a day for men. ?Be aware of how much alcohol is in your drink. In the [place] one drink equals one 12 oz bottle of beer (355 mL), one 5 oz glass of wine (148 mL), or one 1? oz glass of hard liquor (44 mL). ?General instructions ?Do not use any products that contain nicotine or tobacco, such as cigarettes, e-cigarettes, and chewing tobacco. If you need help quitting, ask your health care provider. ?Maintain a healthy weight. ?Keep all follow-up visits as told by your health care provider. These may include consultations with a surgeon or specialist. This is important. ?Where to find more information ?[employer] [facility] [employer] and Digestive [facility] Kidney Diseases: [email or web address] ?Contact a health care provider if: ?You think you have had a gallbladder attack. ?You have been diagnosed with silent gallstones and you develop pain in your abdomen or indigestion. ?You begin to have attacks more often. ?You have dark urine or light-colored stools. ?Get help right away if: ?You have pain from a gallbladder attack that lasts for more than 2 hours. ?You have pain in your abdomen that lasts for more than 5 hours or is getting worse. ?You have a fever or chills. ?You have nausea and vomiting that do not go away. ?You develop jaundice. ?Summary ?Cholelithiasis is a disease in which gallstones  form in the gallbladder. ?This condition may be caused by an imbalance in the different parts that make bile. This can happen if your bile has too much bilirubin or cholesterol, or does not have enough bile salts. ?Treatment for gallstones depends on the severity of the condition. Silent gallstones do not need treatment. ?If gallstones cause a gallbladder attack or other symptoms, treatment usually involves not eating or drinking anything.  Treatment may also include pain medicines and antibiotics, and it sometimes includes a hospital stay. ?Surgery to remove the gallbladder is common if all other treatments have not worked. ?This information is not

## 2021-05-09 NOTE — Progress Notes (Signed)
? ?BP 116/72   Pulse 74   Temp 98.1 ?F (36.7 ?C) (Oral)   Ht 5' 2.21" (1.58 m)   Wt 187 lb 9.6 oz (85.1 kg)   SpO2 97%   BMI 34.09 kg/m?   ? ?Subjective:  ? ? Patient ID: Abigail Delacruz, female    DOB: Apr 27, 1950, 71 y.o.   MRN: [id number] ? ?Chief Complaint  ?Patient presents with  ?? Hyperlipidemia  ?? Gastroesophageal Reflux  ?? Lab work  ?  Review lab work  ? ? ?HPI: ?Abigail Delacruz is a 71 y.o. female ? ?Feels a little better dizziness persists no tinnitus ? ?Hyperlipidemia ?This is a chronic problem. The current episode started more than 1 year ago. The problem is controlled. Recent lipid tests were reviewed and are normal. She has no history of chronic renal disease, diabetes, hypothyroidism, liver disease, obesity or nephrotic syndrome. Pertinent negatives include no chest pain or myalgias.  ?Gastroesophageal Reflux ?She complains of heartburn. She reports no abdominal pain, no belching, no chest pain, no coughing, no early satiety, no globus sensation, no nausea or no sore throat. Pertinent negatives include no fatigue.  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 Pertinent negatives include no abdominal pain, anorexia, arthralgias, change in bowel habit, chest pain, chills, congestion, coughing, fatigue, fever, headaches, joint swelling, myalgias, nausea, numbness, rash, sore throat, swollen glands, urinary symptoms, vertigo, visual change or vomiting.  ? ?Chief Complaint  ?Patient presents with  ?? Hyperlipidemia  ?? Gastroesophageal Reflux  ?? Lab work  ?  Review lab work  ? ? ?Relevant past medical, surgical, family and social history reviewed and updated as indicated. Interim medical history since our last visit reviewed. ?Allergies and medications reviewed and updated. ? ?Review of Systems  ?Constitutional:  Negative for chills, fatigue and fever.  ?HENT:  Negative for congestion and sore throat.   ?Respiratory:  Negative for cough.   ?Cardiovascular:   Negative for chest pain.  ?Gastrointestinal:  Positive for heartburn. Negative for abdominal pain, anorexia, change in bowel habit, nausea and vomiting.  ?Musculoskeletal:  Negative for arthralgias, joint swelling and myalgias.  ?Skin:  Negative for rash.  ?Neurological:  Positive for dizziness. Negative for vertigo, numbness and headaches.  ? ?Per HPI unless specifically indicated above ? ?   ?Objective:  ?  ?BP 116/72   Pulse 74   Temp 98.1 ?F (36.7 ?C) (Oral)   Ht 5' 2.21" (1.58 m)   Wt 187 lb 9.6 oz (85.1 kg)   SpO2 97%   BMI 34.09 kg/m?   ?Wt Readings from Last 3 Encounters:  ?05/09/21 187 lb 9.6 oz (85.1 kg)  ?02/06/21 180 lb 9.6 oz (81.9 kg)  ?12/04/20 181 lb 3.2 oz (82.2 kg)  ?  ?Physical Exam ? ?Results for orders placed or performed in visit on 05/02/21  ?Comprehensive metabolic panel  ?Result Value Ref Range  ? Glucose 98 70 - 99 mg/dL  ? BUN 23 8 - 27 mg/dL  ? Creatinine, Ser 0.66 0.57 - 1.00 mg/dL  ? eGFR 94 >59 mL/min/1.73  ? BUN/Creatinine Ratio 35 (H) 12 - 28  ? Sodium 145 (H) 134 - 144 mmol/L  ? Potassium 4.0 3.5 - 5.2 mmol/L  ? Chloride 102 96 - 106 mmol/L  ? CO2 23 20 - 29 mmol/L  ? Calcium 8.9 8.7 - 10.3 mg/dL  ? Total Protein 6.5 6.0 - 8.5 g/dL  ? Albumin 4.3 3.7 - 4.7 g/dL  ?  Globulin, Total 2.2 1.5 - 4.5 g/dL  ? Albumin/Globulin Ratio 2.0 1.2 - 2.2  ? Bilirubin Total 0.5 0.0 - 1.2 mg/dL  ? Alkaline Phosphatase 143 (H) 44 - 121 IU/L  ? AST 32 0 - 40 IU/L  ? ALT 45 (H) 0 - 32 IU/L  ?Lipid panel  ?Result Value Ref Range  ? Cholesterol, Total 184 100 - 199 mg/dL  ? Triglycerides 139 0 - 149 mg/dL  ? HDL 47 >39 mg/dL  ? VLDL Cholesterol Cal 25 5 - 40 mg/dL  ? LDL Chol Calc (NIH) 112 (H) 0 - 99 mg/dL  ? Chol/HDL Ratio 3.9 0.0 - 4.4 ratio  ? ?   ? ? ?Current Outpatient Medications:  ??  Multiple Vitamins-Minerals (MULTIVITAMIN ADULT) CHEW, Chew by mouth., Disp: , Rfl:  ??  atorvastatin (LIPITOR) 40 MG tablet, Take 1 tablet (40 mg total) by mouth daily., Disp: 90 tablet, Rfl: 3  ? ? ?Assessment  & Plan:  ?HLD is on lipitor 20 mg  ?HLD recheck FLP, check LFT's work on diet, SE of meds explained to pt. low fat and high fiber diet explained to pt. ? Latest Reference Range & Units 02/06/21 09:43 05/02/21 08:24  ?Total CHOL/HDL Ratio 0.0 - 4.4 ratio 4.7 (H) 3.9  ?Cholesterol, Total 100 - 199 mg/dL 254 (H) 184  ?HDL Cholesterol >39 mg/dL 54 47  ?Triglycerides 0 - 149 mg/dL 179 (H) 139  ?VLDL Cholesterol Cal 5 - 40 mg/dL 33 25  ?LDL Chol Calc (NIH) 0 - 99 mg/dL 167 (H) 112 (H)  ?(H): Data is abnormally high ? ? ?Elevated Alkaline phosphatase :  ? ? Latest Reference Range & Units 05/02/21 08:24  ?BUN/Creatinine Ratio 12 - 28  35 (H)  ?eGFR >59 mL/min/1.73 94  ?Alkaline Phosphatase 44 - 121 IU/L 143 (H)  ?Albumin 3.7 - 4.7 g/dL 4.3  ?Albumin/Globulin Ratio 1.2 - 2.2  2.0  ?(H): Data is abnormally high ? ?Dizziness : ? Sec to bells palsy with history of Ramsay Hunt syndrome, has had an MRI sees Dr. Melrose Nakayama , per chart review -  MRIs showed lingering enhancement.  ?Was placed on medrol dose pak ? ? ? ?Problem List Items Addressed This Visit   ? ?  ? Other  ? Hyperlipidemia  ? Relevant Medications  ? atorvastatin (LIPITOR) 40 MG tablet  ? ?Other Visit Diagnoses   ? ? Elevated alkaline phosphatase level    -  Primary  ? Relevant Orders  ? US Abdomen Limited RUQ (LIVER/GB)  ? CBC with Differential/Platelet  ? Comprehensive metabolic panel  ? ?  ?  ? ?Orders Placed This Encounter  ?Procedures  ?? US Abdomen Limited RUQ (LIVER/GB)  ?? CBC with Differential/Platelet  ?? Comprehensive metabolic panel  ?  ? ?Meds ordered this encounter  ?Medications  ?? atorvastatin (LIPITOR) 40 MG tablet  ?  Sig: Take 1 tablet (40 mg total) by mouth daily.  ?  Dispense:  90 tablet  ?  Refill:  3  ?  ? ?Follow up plan: ?Return in about 6 weeks (around 06/20/2021). ? ? ?

## 2021-05-17 ENCOUNTER — Ambulatory Visit
Admission: RE | Admit: 2021-05-17 | Discharge: 2021-05-17 | Disposition: A | Discharge status: Home / Self Care | Payer: No Typology Code available for payment source | Source: Ambulatory Visit | Attending: Internal Medicine | Admitting: Internal Medicine

## 2021-05-17 ENCOUNTER — Other Ambulatory Visit: Payer: Self-pay

## 2021-05-17 DIAGNOSIS — R748 Abnormal levels of other serum enzymes: Secondary | ICD-10-CM | POA: Diagnosis not present

## 2021-05-17 DIAGNOSIS — K7689 Other specified diseases of liver: Secondary | ICD-10-CM | POA: Diagnosis not present

## 2021-05-21 ENCOUNTER — Telehealth: Payer: Self-pay | Admitting: Internal Medicine

## 2021-05-21 ENCOUNTER — Encounter: Payer: Self-pay | Admitting: Internal Medicine

## 2021-05-21 NOTE — Addendum Note (Signed)
Addended byCharlynne Cousins on: 05/21/2021 09:19 AM ? ? Modules accepted: Orders ? ?

## 2021-05-21 NOTE — Telephone Encounter (Signed)
Spoke with Patients daughter Adonis Huguenin, explained U/L results to her that state that her mother has a hepatic cyst and that was the reason for the referral to GI. Daughter verbalized understanding and will talk with her mother about the results.  ?

## 2021-05-21 NOTE — Telephone Encounter (Signed)
Pl call pt like we had talked about thnx.

## 2021-05-21 NOTE — Progress Notes (Signed)
Please let pt know she has hepatic cysts will need to see for further fu and mx.

## 2021-05-21 NOTE — Telephone Encounter (Signed)
Copied from [place] [id number]. Topic: General - Other ?>> May 21, 2021  8:28 AM Valere Dross wrote: ?Reason for CRM: Pt called in stating she did not understand her recent results she got back, and requested if a nurse could call her daughter Berline Lopes at [phone], so she can call her and explain the results to her. (Daughter is on DPR form). ?

## 2021-05-21 NOTE — Telephone Encounter (Signed)
Spoke with patients daughter, see other telephone message from today ?

## 2021-05-29 ENCOUNTER — Telehealth: Payer: Self-pay | Admitting: Internal Medicine

## 2021-05-29 NOTE — Telephone Encounter (Signed)
Medication Refill - Medication:  ?omeprazole (PRILOSEC) 20 MG capsule  ?/ was prescribed by Webb Silversmith previous pcp ? ?Has the patient contacted their pharmacy? Yes.   ?(Agent: If no, request that the patient contact the pharmacy for the refill. If patient does not wish to contact the pharmacy document the reason why and proceed with request.) ?(Agent: If yes, when and what did the pharmacy advise?) ?No refills /DR must authorize  ?Preferred Pharmacy (with phone number or street name):  ?[facility] [place] - [place] Phone:  [phone]  ?Fax:  [phone]  ?  ? ?Has the patient been seen for an appointment in the last year OR does the patient have an upcoming appointment? Yes.   ? ?Agent: Please be advised that RX refills may take up to 3 business days. We ask that you follow-up with your pharmacy. ?

## 2021-05-30 ENCOUNTER — Other Ambulatory Visit: Payer: Self-pay

## 2021-05-30 MED ORDER — OMEPRAZOLE 20 MG PO CPDR: 20.0000 mg | DELAYED_RELEASE_CAPSULE | Freq: Every day | ORAL | 3 refills | Status: DC

## 2021-05-30 NOTE — Telephone Encounter (Signed)
Last seen on 05/09/21 ? ?Up coming visit 06/20/21 ?

## 2021-05-30 NOTE — Telephone Encounter (Signed)
Refill request sent to PCP for approval ? ?

## 2021-06-13 ENCOUNTER — Other Ambulatory Visit: Payer: No Typology Code available for payment source

## 2021-06-13 DIAGNOSIS — R748 Abnormal levels of other serum enzymes: Secondary | ICD-10-CM | POA: Diagnosis not present

## 2021-06-13 DIAGNOSIS — E782 Mixed hyperlipidemia: Secondary | ICD-10-CM

## 2021-06-14 LAB — CBC WITH DIFFERENTIAL/PLATELET
Basophils Absolute: 0 10*3/uL (ref 0.0–0.2)
Basos: 1 %
EOS (ABSOLUTE): 0.2 10*3/uL (ref 0.0–0.4)
Eos: 3 %
Hematocrit: 43.1 % (ref 34.0–46.6)
Hemoglobin: 14.2 g/dL (ref 11.1–15.9)
Immature Grans (Abs): 0 10*3/uL (ref 0.0–0.1)
Immature Granulocytes: 0 %
Lymphocytes Absolute: 1.6 10*3/uL (ref 0.7–3.1)
Lymphs: 32 %
MCH: 30.9 pg (ref 26.6–33.0)
MCHC: 32.9 g/dL (ref 31.5–35.7)
MCV: 94 fL (ref 79–97)
Monocytes Absolute: 0.4 10*3/uL (ref 0.1–0.9)
Monocytes: 8 %
Neutrophils Absolute: 2.8 10*3/uL (ref 1.4–7.0)
Neutrophils: 56 %
Platelets: 250 10*3/uL (ref 150–450)
RBC: 4.59 x10E6/uL (ref 3.77–5.28)
RDW: 13.1 % (ref 11.7–15.4)
WBC: 5 10*3/uL (ref 3.4–10.8)

## 2021-06-14 LAB — COMPREHENSIVE METABOLIC PANEL
ALT: 33 IU/L — ABNORMAL HIGH (ref 0–32)
AST: 28 IU/L (ref 0–40)
Albumin/Globulin Ratio: 1.9 (ref 1.2–2.2)
Albumin: 4.3 g/dL (ref 3.7–4.7)
Alkaline Phosphatase: 168 IU/L — ABNORMAL HIGH (ref 44–121)
BUN/Creatinine Ratio: 20 (ref 12–28)
BUN: 14 mg/dL (ref 8–27)
Bilirubin Total: 0.3 mg/dL (ref 0.0–1.2)
CO2: 26 mmol/L (ref 20–29)
Calcium: 9.2 mg/dL (ref 8.7–10.3)
Chloride: 102 mmol/L (ref 96–106)
Creatinine, Ser: 0.69 mg/dL (ref 0.57–1.00)
Globulin, Total: 2.3 g/dL (ref 1.5–4.5)
Glucose: 108 mg/dL — ABNORMAL HIGH (ref 70–99)
Potassium: 4.3 mmol/L (ref 3.5–5.2)
Sodium: 140 mmol/L (ref 134–144)
Total Protein: 6.6 g/dL (ref 6.0–8.5)
eGFR: 93 mL/min/{1.73_m2} (ref >59)

## 2021-06-14 LAB — LIPID PANEL
Chol/HDL Ratio: 3.1 ratio (ref 0.0–4.4)
Cholesterol, Total: 148 mg/dL (ref 100–199)
HDL: 48 mg/dL (ref >39)
LDL Chol Calc (NIH): 78 mg/dL (ref 0–99)
Triglycerides: 125 mg/dL (ref 0–149)
VLDL Cholesterol Cal: 22 mg/dL (ref 5–40)

## 2021-06-19 ENCOUNTER — Telehealth: Payer: Self-pay | Admitting: Gastroenterology

## 2021-06-19 NOTE — Telephone Encounter (Signed)
Abigail D. With Dr Neomia Dear called requesting patient be seen earlier. ALT's going up and saw that there are cysts on her liver. Requesting a call back at [phone] or you can teams message her. ?

## 2021-06-20 ENCOUNTER — Ambulatory Visit (INDEPENDENT_AMBULATORY_CARE_PROVIDER_SITE_OTHER): Payer: No Typology Code available for payment source | Admitting: Internal Medicine

## 2021-06-20 ENCOUNTER — Encounter: Payer: Self-pay | Admitting: Internal Medicine

## 2021-06-20 VITALS — BP 119/82 | HR 78 | Temp 97.8°F | Ht 62.21 in | Wt 184.4 lb

## 2021-06-20 DIAGNOSIS — R748 Abnormal levels of other serum enzymes: Secondary | ICD-10-CM | POA: Diagnosis not present

## 2021-06-20 DIAGNOSIS — K7689 Other specified diseases of liver: Secondary | ICD-10-CM | POA: Insufficient documentation

## 2021-06-20 NOTE — Progress Notes (Signed)
? ?BP 119/82   Pulse 78   Temp 97.8 ?F (36.6 ?C) (Oral)   Ht 5' 2.21" (1.58 m)   Wt 184 lb 6.4 oz (83.6 kg)   SpO2 97%   BMI 33.51 kg/m?   ? ?Subjective:  ? ? Patient ID: Abigail Delacruz, female    DOB: 1950-12-13, 71 y.o.   MRN: [id number] ? ?Chief Complaint  ?Patient presents with  ? RUQ pain  ?  Follow up on RUQ ultrasound showing liver cysts.   ? Lab work  ?  Here to follow up on elevated alkaline phosphates   ? ? ?HPI: ?Abigail Delacruz is a 71 y.o. female ? ?Abdominal Pain ?This is a recurrent (RUQ pain intemrittently. US shows hepatic cysts with elevated LFTs pt has had no rashes. jaundice itching or changes in bowel movts or textrue or color no weight loss does admit to drinking rum and coke.) problem. Pertinent negatives include no anorexia, arthralgias, belching, constipation, diarrhea, fever, flatus, frequency, headaches, hematochezia, hematuria, melena, myalgias, nausea or vomiting.  ? ?Chief Complaint  ?Patient presents with  ? RUQ pain  ?  Follow up on RUQ ultrasound showing liver cysts.   ? Lab work  ?  Here to follow up on elevated alkaline phosphates   ? ? ?Relevant past medical, surgical, family and social history reviewed and updated as indicated. Interim medical history since our last visit reviewed. ?Allergies and medications reviewed and updated. ? ?Review of Systems  ?Constitutional:  Negative for fever.  ?Gastrointestinal:  Positive for abdominal pain. Negative for anorexia, constipation, diarrhea, flatus, hematochezia, melena, nausea and vomiting.  ?Genitourinary:  Negative for frequency and hematuria.  ?Musculoskeletal:  Negative for arthralgias and myalgias.  ?Neurological:  Negative for headaches.  ? ?Per HPI unless specifically indicated above ? ?   ?Objective:  ?  ?BP 119/82   Pulse 78   Temp 97.8 ?F (36.6 ?C) (Oral)   Ht 5' 2.21" (1.58 m)   Wt 184 lb 6.4 oz (83.6 kg)   SpO2 97%   BMI 33.51 kg/m?   ?Wt Readings from Last 3 Encounters:  ?06/20/21 184 lb 6.4 oz (83.6 kg)   ?05/09/21 187 lb 9.6 oz (85.1 kg)  ?02/06/21 180 lb 9.6 oz (81.9 kg)  ?  ?Physical Exam ?Vitals and nursing note reviewed.  ?Constitutional:   ?   General: She is not in acute distress. ?   Appearance: Normal appearance. She is not ill-appearing or diaphoretic.  ?Eyes:  ?   Conjunctiva/sclera: Conjunctivae normal.  ?Cardiovascular:  ?   Rate and Rhythm: Normal rate and regular rhythm.  ?   Heart sounds: No murmur heard. ?Pulmonary:  ?   Effort: No respiratory distress.  ?   Breath sounds: No stridor. No rhonchi.  ?Abdominal:  ?   General: Abdomen is flat. Bowel sounds are normal. There is no distension.  ?   Palpations: Abdomen is soft. There is no mass.  ?   Tenderness: There is no abdominal tenderness. There is no guarding.  ?Skin: ?   General: Skin is warm and dry.  ?   Coloration: Skin is not jaundiced.  ?   Findings: No erythema.  ?Neurological:  ?   Mental Status: She is alert.  ? ? ?Results for orders placed or performed in visit on 06/13/21  ?Comprehensive metabolic panel  ?Result Value Ref Range  ? Glucose 108 (H) 70 - 99 mg/dL  ? BUN 14 8 - 27 mg/dL  ? Creatinine, Ser 0.69  0.57 - 1.00 mg/dL  ? eGFR 93 >59 mL/min/1.73  ? BUN/Creatinine Ratio 20 12 - 28  ? Sodium 140 134 - 144 mmol/L  ? Potassium 4.3 3.5 - 5.2 mmol/L  ? Chloride 102 96 - 106 mmol/L  ? CO2 26 20 - 29 mmol/L  ? Calcium 9.2 8.7 - 10.3 mg/dL  ? Total Protein 6.6 6.0 - 8.5 g/dL  ? Albumin 4.3 3.7 - 4.7 g/dL  ? Globulin, Total 2.3 1.5 - 4.5 g/dL  ? Albumin/Globulin Ratio 1.9 1.2 - 2.2  ? Bilirubin Total 0.3 0.0 - 1.2 mg/dL  ? Alkaline Phosphatase 168 (H) 44 - 121 IU/L  ? AST 28 0 - 40 IU/L  ? ALT 33 (H) 0 - 32 IU/L  ?CBC with Differential/Platelet  ?Result Value Ref Range  ? WBC 5.0 3.4 - 10.8 x10E3/uL  ? RBC 4.59 3.77 - 5.28 x10E6/uL  ? Hemoglobin 14.2 11.1 - 15.9 g/dL  ? Hematocrit 43.1 34.0 - 46.6 %  ? MCV 94 79 - 97 fL  ? MCH 30.9 26.6 - 33.0 pg  ? MCHC 32.9 31.5 - 35.7 g/dL  ? RDW 13.1 11.7 - 15.4 %  ? Platelets 250 150 - 450 x10E3/uL  ?  Neutrophils 56 Not Estab. %  ? Lymphs 32 Not Estab. %  ? Monocytes 8 Not Estab. %  ? Eos 3 Not Estab. %  ? Basos 1 Not Estab. %  ? Neutrophils Absolute 2.8 1.4 - 7.0 x10E3/uL  ? Lymphocytes Absolute 1.6 0.7 - 3.1 x10E3/uL  ? Monocytes Absolute 0.4 0.1 - 0.9 x10E3/uL  ? EOS (ABSOLUTE) 0.2 0.0 - 0.4 x10E3/uL  ? Basophils Absolute 0.0 0.0 - 0.2 x10E3/uL  ? Immature Granulocytes 0 Not Estab. %  ? Immature Grans (Abs) 0.0 0.0 - 0.1 x10E3/uL  ?Lipid panel  ?Result Value Ref Range  ? Cholesterol, Total 148 100 - 199 mg/dL  ? Triglycerides 125 0 - 149 mg/dL  ? HDL 48 >39 mg/dL  ? VLDL Cholesterol Cal 22 5 - 40 mg/dL  ? LDL Chol Calc (NIH) 78 0 - 99 mg/dL  ? Chol/HDL Ratio 3.1 0.0 - 4.4 ratio  ? ?   ? ? ?Current Outpatient Medications:  ?  atorvastatin (LIPITOR) 40 MG tablet, Take 1 tablet (40 mg total) by mouth daily., Disp: 90 tablet, Rfl: 3 ?  Cholecalciferol (VITAMIN D3) 1.25 MG (50000 UT) TABS, Take by mouth., Disp: , Rfl:  ?  Multiple Vitamins-Minerals (MULTIVITAMIN ADULT) CHEW, Chew by mouth., Disp: , Rfl:  ?  omeprazole (PRILOSEC) 20 MG capsule, Take 1 capsule (20 mg total) by mouth daily., Disp: 30 capsule, Rfl: 3  ? ? ?Assessment & Plan:  ?Hepatic cysts :  ?Fu with Gi for further mx  ?Might need repeat scans sooner if deemed necessary ? ? ?Problem List Items Addressed This Visit   ? ?  ? Digestive  ? Hepatic cyst - Primary  ?  ? Other  ? Elevated alkaline phosphatase level  ?  ? ?No orders of the defined types were placed in this encounter. ?  ? ?No orders of the defined types were placed in this encounter. ?  ? ?Follow up plan: ?Return in about 6 weeks (around 08/01/2021). ? ?

## 2021-06-21 NOTE — Telephone Encounter (Signed)
Msg sent to [facility] letting her know that that is the next available appt  ?

## 2021-07-16 ENCOUNTER — Ambulatory Visit (INDEPENDENT_AMBULATORY_CARE_PROVIDER_SITE_OTHER): Payer: No Typology Code available for payment source | Admitting: Gastroenterology

## 2021-07-16 ENCOUNTER — Encounter: Payer: Self-pay | Admitting: Gastroenterology

## 2021-07-16 VITALS — BP 133/83 | HR 73 | Temp 98.0°F | Ht 62.0 in | Wt 191.0 lb

## 2021-07-16 DIAGNOSIS — R748 Abnormal levels of other serum enzymes: Secondary | ICD-10-CM

## 2021-07-16 NOTE — Progress Notes (Signed)
Gastroenterology Consultation  Referring Provider:     Charlynne Cousins, MD Primary Care Physician:  Charlynne Cousins, MD Primary Gastroenterologist:  Dr. Allen Norris     Reason for Consultation:     Abnormal liver enzymes        HPI:   Abigail Delacruz is a 71 y.o. y/o female referred for consultation & management of Abnormal liver enzymes by Dr. Neomia Dear, Loman Brooklyn, MD.  This patient comes to see me after being found to have abnormal liver enzymes by her primary care provider.  It appears that the patient duct phosphatase and ALT have been elevated since May 2022 with the ALT returning to normal up until March of this year. The patient's most recent labs that shown:  Component     Latest Ref Rng 07/12/2020 09/13/2020 02/06/2021  Total Bilirubin     0.0 - 1.2 mg/dL 0.5  0.5  0.4   AST     0 - 40 IU/L '21  26  22   '$ ALT     0 - 32 IU/L 49 (H)  26  23   Alkaline Phosphatase     44 - 121 IU/L 148 (H)  156 (H)  133 (H)    Component     Latest Ref Rng 05/02/2021 06/13/2021  Total Bilirubin     0.0 - 1.2 mg/dL 0.5  0.3   AST     0 - 40 IU/L 32  28   ALT     0 - 32 IU/L 45 (H)  33 (H)   Alkaline Phosphatase     44 - 121 IU/L 143 (H)  168 (H)    The patient's hepatitis C antibody was negative in 2020.  It does not appear that the patient has had any workup for this abnormal alkaline phosphatase or increased ALT in the past. The patient had a normal colonoscopy by me back in 2019. The patient reports that she was unaware that her liver enzymes were elevated back in May of last year.  She denies any issues at the present time and reports that she has been gaining weight and not losing weight. The patient underwent a right upper quadrant ultrasound in March that showed:  IMPRESSION: Multiple septated hepatic cysts. Correlation with six-month follow-up right upper quadrant ultrasound is recommended to determine stability.    Past Medical History:  Diagnosis Date   Cataract    Hyperlipidemia     Past  Surgical History:  Procedure Laterality Date   APPENDECTOMY  1971   CATARACT EXTRACTION W/PHACO Right 09/08/2017   Procedure: CATARACT EXTRACTION PHACO AND INTRAOCULAR LENS PLACEMENT ([place])  RIGHT;  Surgeon: Eulogio Bear, MD;  Location: [facility];  Service: Ophthalmology;  Laterality: Right;   CATARACT EXTRACTION W/PHACO Left 09/29/2017   Procedure: CATARACT EXTRACTION PHACO AND INTRAOCULAR LENS PLACEMENT ([place]) LEFT;  Surgeon: Eulogio Bear, MD;  Location: [facility];  Service: Ophthalmology;  Laterality: Left;   COLONOSCOPY WITH PROPOFOL N/A 10/20/2017   Procedure: COLONOSCOPY WITH PROPOFOL;  Surgeon: Lucilla Lame, MD;  Location: [facility];  Service: Endoscopy;  Laterality: N/A;    Prior to Admission medications   Medication Sig Start Date End Date Taking? Authorizing Provider  atorvastatin (LIPITOR) 40 MG tablet Take 1 tablet (40 mg total) by mouth daily. 05/09/21   Vigg, Avanti, MD  Cholecalciferol (VITAMIN D3) 1.25 MG (50000 UT) TABS Take by mouth.    [provider]  Multiple Vitamins-Minerals (MULTIVITAMIN ADULT) CHEW Chew by  mouth.    [provider]  omeprazole (PRILOSEC) 20 MG capsule Take 1 capsule (20 mg total) by mouth daily. 05/30/21   Charlynne Cousins, MD    Family History  Problem Relation Age of Onset   Kidney failure Mother    Cirrhosis Father    Alcohol abuse Father    Diabetes Brother    Hypertension Brother    Arthritis Brother    Breast cancer Neg Hx      Social History   Tobacco Use   Smoking status: Former    Types: Cigarettes    Quit date: 1974    Years since quitting: 49.4   Smokeless tobacco: Never  Vaping Use   Vaping Use: Never used  Substance Use Topics   Alcohol use: Yes    Comment: social 1x/yr   Drug use: Never    Allergies as of 07/16/2021 - Review Complete 06/20/2021  Allergen Reaction Noted   Ciprodex [ciprofloxacin-dexamethasone]  06/30/2020    Review of Systems:    All systems  reviewed and negative except where noted in HPI.   Physical Exam:  There were no vitals taken for this visit. No LMP recorded. Patient is postmenopausal. General:   Alert,  Well-developed, well-nourished, pleasant and cooperative in NAD Head:  Normocephalic and atraumatic. Eyes:  Sclera clear, no icterus.   Conjunctiva pink. Ears:  Normal auditory acuity. Neck:  Supple; no masses or thyromegaly. Lungs:  Respirations even and unlabored.  Clear throughout to auscultation.   No wheezes, crackles, or rhonchi. No acute distress. Heart:  Regular rate and rhythm; no murmurs, clicks, rubs, or gallops. Abdomen:  Normal bowel sounds.  No bruits.  Soft, non-tender and non-distended without masses, hepatosplenomegaly or hernias noted.  No guarding or rebound tenderness.  Negative Carnett sign.   Rectal:  Deferred.  Pulses:  Normal pulses noted. Extremities:  No clubbing or edema.  No cyanosis. Neurologic:  Alert and oriented x3;  grossly normal neurologically. Skin:  Intact without significant lesions or rashes.  No jaundice. Lymph Nodes:  No significant cervical adenopathy. Psych:  Alert and cooperative. Normal mood and affect.  Imaging Studies: No results found.  Assessment and Plan:   Abigail Delacruz is a 71 y.o. y/o female who comes in today with abnormal liver enzymes and the patient will have lab work sent off for possible causes of abnormal liver enzymes including fractionation of the upper phosphatase to make sure that it is a liver source.  The patient also have a GGT checked.  The patient has been told that the ultrasound showed multiple complex this and the recommendation was a repeat ultrasound in 6 months to see if any of his sister enlarging.  The patient and her daughter question whether a biopsy is needed.  I informed the patient that with the multiple cysts it would not be possible to biopsy all the cysts. The patient has also been told that she will be notified with the lab results  once they're back.  The patient has been explained the plan and agrees with it.    Lucilla Lame, MD. [facility]al    Note: This dictation was prepared with Dragon dictation along with smaller phrase technology. Any transcriptional errors that result from this process are unintentional.

## 2021-07-17 LAB — GAMMA GT: GGT: 29 IU/L (ref 0–60)

## 2021-07-20 LAB — HEPATITIS C ANTIBODY: Hep C Virus Ab: NONREACTIVE

## 2021-07-20 LAB — HEPATIC FUNCTION PANEL
ALT: 31 IU/L (ref 0–32)
AST: 28 IU/L (ref 0–40)
Albumin: 4.4 g/dL (ref 3.7–4.7)
Alkaline Phosphatase: 179 IU/L — ABNORMAL HIGH (ref 44–121)
Bilirubin Total: 0.3 mg/dL (ref 0.0–1.2)
Bilirubin, Direct: 0.1 mg/dL (ref 0.00–0.40)
Total Protein: 6.8 g/dL (ref 6.0–8.5)

## 2021-07-20 LAB — IRON,TIBC AND FERRITIN PANEL
Ferritin: 97 ng/mL (ref 15–150)
Iron Saturation: 28 % (ref 15–55)
Iron: 78 ug/dL (ref 27–139)
Total Iron Binding Capacity: 274 ug/dL (ref 250–450)
UIBC: 196 ug/dL (ref 118–369)

## 2021-07-20 LAB — ALKALINE PHOSPHATASE, ISOENZYMES
BONE FRACTION: 20 % (ref 14–68)
INTESTINAL FRAC.: 9 % (ref 0–18)
LIVER FRACTION: 71 % (ref 18–85)

## 2021-07-20 LAB — MITOCHONDRIAL/SMOOTH MUSCLE AB PNL
Mitochondrial Ab: <20 Units (ref 0.0–20.0)
Smooth Muscle Ab: 5 Units (ref 0–19)

## 2021-07-20 LAB — HEPATITIS A ANTIBODY, TOTAL: hep A Total Ab: POSITIVE — AB

## 2021-07-20 LAB — ALPHA-1-ANTITRYPSIN: A-1 Antitrypsin: 83 mg/dL — ABNORMAL LOW (ref 101–187)

## 2021-07-20 LAB — HEPATITIS B SURFACE ANTIBODY,QUALITATIVE: Hep B Surface Ab, Qual: NONREACTIVE

## 2021-07-20 LAB — HEPATITIS B SURFACE ANTIGEN: Hepatitis B Surface Ag: NEGATIVE

## 2021-07-20 LAB — CERULOPLASMIN: Ceruloplasmin: 29.7 mg/dL (ref 19.0–39.0)

## 2021-07-23 ENCOUNTER — Encounter: Payer: Self-pay | Admitting: Internal Medicine

## 2021-07-24 NOTE — Telephone Encounter (Signed)
Returned patient's call and informed her that the Hep A panel that was ordered by Dr. Allen Norris is just routine and nothing to worry about

## 2021-08-01 ENCOUNTER — Ambulatory Visit (INDEPENDENT_AMBULATORY_CARE_PROVIDER_SITE_OTHER): Payer: No Typology Code available for payment source | Admitting: Internal Medicine

## 2021-08-01 ENCOUNTER — Encounter: Payer: Self-pay | Admitting: Internal Medicine

## 2021-08-01 ENCOUNTER — Encounter: Payer: Self-pay | Admitting: Gastroenterology

## 2021-08-01 VITALS — BP 132/84 | HR 72 | Temp 98.1°F | Ht 62.01 in | Wt 188.4 lb

## 2021-08-01 DIAGNOSIS — R197 Diarrhea, unspecified: Secondary | ICD-10-CM

## 2021-08-01 DIAGNOSIS — R42 Dizziness and giddiness: Secondary | ICD-10-CM | POA: Diagnosis not present

## 2021-08-01 MED ORDER — LACTINEX PO CHEW: 1.0000 | CHEWABLE_TABLET | Freq: Three times a day (TID) | ORAL | 1 refills | Status: DC

## 2021-08-01 NOTE — Progress Notes (Signed)
BP 132/84   Pulse 72   Temp 98.1 F (36.7 C) (Oral)   Ht 5' 2.01" (1.575 m)   Wt 188 lb 6.4 oz (85.5 kg)   SpO2 96%   BMI 34.45 kg/m    Subjective:    Patient ID: Abigail Delacruz, female    DOB: 11/27/1950, 71 y.o.   MRN: [id number]  Chief Complaint  Patient presents with   Hepatic Cyst    Here for follow up after seeing Dr. Allen Norris.   Diarrhea    Started about 2 weeks ago    HPI: Abigail Delacruz is a 71 y.o. female  Recnelt travelled to [place] in may   Diarrhea  This is a recurrent (diarrhea x 2 weeks. loose 4-5 times.) problem. The current episode started 1 to 4 weeks ago. Pertinent negatives include no abdominal pain, arthralgias, bloating, chills, coughing, fever, headaches, increased  flatus, myalgias, sweats, URI, vomiting or weight loss.  Dizziness This is a chronic (has had worsenign dizzinenss, [place] dr Melrose Nakayama for such) problem. The current episode started more than 1 year ago. The problem occurs intermittently. Associated symptoms include a change in bowel habit. Pertinent negatives include no abdominal pain, anorexia, arthralgias, chest pain, chills, congestion, coughing, diaphoresis, fatigue, fever, headaches, joint swelling, myalgias, nausea, neck pain, numbness, rash, sore throat, swollen glands, urinary symptoms, vertigo, visual change, vomiting or weakness.    Chief Complaint  Patient presents with   Hepatic Cyst    Here for follow up after seeing Dr. Allen Norris.   Diarrhea    Started about 2 weeks ago    Relevant past medical, surgical, family and social history reviewed and updated as indicated. Interim medical history since our last visit reviewed. Allergies and medications reviewed and updated.  Review of Systems  Constitutional:  Negative for chills, diaphoresis, fatigue, fever and weight loss.  HENT:  Negative for congestion and sore throat.   Respiratory:  Negative for cough.   Cardiovascular:  Negative for  chest pain.  Gastrointestinal:  Positive for change in bowel habit and diarrhea. Negative for abdominal pain, anorexia, bloating, flatus, nausea and vomiting.  Musculoskeletal:  Negative for arthralgias, joint swelling, myalgias and neck pain.  Skin:  Negative for rash.  Neurological:  Positive for dizziness. Negative for vertigo, weakness, numbness and headaches.   Per HPI unless specifically indicated above     Objective:    BP 132/84   Pulse 72   Temp 98.1 F (36.7 C) (Oral)   Ht 5' 2.01" (1.575 m)   Wt 188 lb 6.4 oz (85.5 kg)   SpO2 96%   BMI 34.45 kg/m   Wt Readings from Last 3 Encounters:  08/01/21 188 lb 6.4 oz (85.5 kg)  07/16/21 191 lb (86.6 kg)  06/20/21 184 lb 6.4 oz (83.6 kg)    Physical Exam  Results for orders placed or performed in visit on 07/16/21  Alkaline phosphatase, isoenzymes  Result Value Ref Range   LIVER FRACTION 71 18 - 85 %   BONE FRACTION 20 14 - 68 %   INTESTINAL FRAC. 9 0 - 18 %  Hepatitis B surface antibody,qualitative  Result Value Ref Range   Hep B Surface Ab, Qual Non Reactive   Hepatitis B surface antigen  Result Value Ref Range   Hepatitis B Surface Ag Negative Negative  Hepatitis A antibody, total  Result Value Ref Range   hep A Total Ab Positive (A) Negative  Mitochondrial/smooth muscle  ab pnl  Result Value Ref Range   Smooth Muscle Ab 5 0 - 19 Units   Mitochondrial Ab <20.0 0.0 - 20.0 Units  Ceruloplasmin  Result Value Ref Range   Ceruloplasmin 29.7 19.0 - 39.0 mg/dL  Hepatitis C antibody  Result Value Ref Range   Hep C Virus Ab Non Reactive Non Reactive  Alpha-1-antitrypsin  Result Value Ref Range   A-1 Antitrypsin 83 (L) 101 - 187 mg/dL  Hepatic function panel  Result Value Ref Range   Total Protein 6.8 6.0 - 8.5 g/dL   Albumin 4.4 3.7 - 4.7 g/dL   Bilirubin Total 0.3 0.0 - 1.2 mg/dL   Bilirubin, Direct 0.10 0.00 - 0.40 mg/dL   Alkaline Phosphatase 179 (H) 44 - 121 IU/L   AST 28 0 - 40 IU/L   ALT 31 0 - 32 IU/L   Iron, TIBC and Ferritin Panel  Result Value Ref Range   Total Iron Binding Capacity 274 250 - 450 ug/dL   UIBC 196 118 - 369 ug/dL   Iron 78 27 - 139 ug/dL   Iron Saturation 28 15 - 55 %   Ferritin 97 15 - 150 ng/mL  Gamma GT  Result Value Ref Range   GGT 29 0 - 60 IU/L        Current Outpatient Medications:    atorvastatin (LIPITOR) 40 MG tablet, Take 1 tablet (40 mg total) by mouth daily., Disp: 90 tablet, Rfl: 3   Cholecalciferol (VITAMIN D3) 1.25 MG (50000 UT) TABS, Take by mouth., Disp: , Rfl:    Multiple Vitamins-Minerals (MULTIVITAMIN ADULT) CHEW, Chew by mouth., Disp: , Rfl:    omeprazole (PRILOSEC) 20 MG capsule, Take 1 capsule (20 mg total) by mouth daily., Disp: 30 capsule, Rfl: 3    Assessment & Plan:   Acute Diarrhea ? Sec to viral Gastroenteritis will need to check for COVID and FLU . Will need to use probiotics after every loose stool Consider imodium  Pt advised a bland BRAT diet today.  Advised to call the office or go to the ER if she develops further abdominal pain crampign, any new onset of bleeding / black stools or  fresh red blood from any orifice,.  Pt verbalized understanding of such.   Hep a ab positive :  Which is total antibody meaning that it is both IgG and IgM.   If the patient had a positive IgM and active infection then the patient's GGT and LFTs would be elevated, which they are not.  D/w GI Dr. Allen Norris Pt to fu with them if worsens.   Problem List Items Addressed This Visit   None    No orders of the defined types were placed in this encounter.    No orders of the defined types were placed in this encounter.    Follow up plan: No follow-ups on file.

## 2021-08-02 LAB — COMPREHENSIVE METABOLIC PANEL
ALT: 39 IU/L — ABNORMAL HIGH (ref 0–32)
AST: 24 IU/L (ref 0–40)
Albumin/Globulin Ratio: 1.7 (ref 1.2–2.2)
Albumin: 4.4 g/dL (ref 3.7–4.7)
Alkaline Phosphatase: 169 IU/L — ABNORMAL HIGH (ref 44–121)
BUN/Creatinine Ratio: 28 (ref 12–28)
BUN: 19 mg/dL (ref 8–27)
Bilirubin Total: 0.3 mg/dL (ref 0.0–1.2)
CO2: 24 mmol/L (ref 20–29)
Calcium: 9.3 mg/dL (ref 8.7–10.3)
Chloride: 105 mmol/L (ref 96–106)
Creatinine, Ser: 0.67 mg/dL (ref 0.57–1.00)
Globulin, Total: 2.6 g/dL (ref 1.5–4.5)
Glucose: 102 mg/dL — ABNORMAL HIGH (ref 70–99)
Potassium: 4.4 mmol/L (ref 3.5–5.2)
Sodium: 141 mmol/L (ref 134–144)
Total Protein: 7 g/dL (ref 6.0–8.5)
eGFR: 93 mL/min/{1.73_m2} (ref >59)

## 2021-08-02 LAB — CBC WITH DIFFERENTIAL/PLATELET
Basophils Absolute: 0 10*3/uL (ref 0.0–0.2)
Basos: 1 %
EOS (ABSOLUTE): 0.1 10*3/uL (ref 0.0–0.4)
Eos: 3 %
Hematocrit: 43.3 % (ref 34.0–46.6)
Hemoglobin: 14.2 g/dL (ref 11.1–15.9)
Immature Grans (Abs): 0 10*3/uL (ref 0.0–0.1)
Immature Granulocytes: 0 %
Lymphocytes Absolute: 1.7 10*3/uL (ref 0.7–3.1)
Lymphs: 32 %
MCH: 30.2 pg (ref 26.6–33.0)
MCHC: 32.8 g/dL (ref 31.5–35.7)
MCV: 92 fL (ref 79–97)
Monocytes Absolute: 0.4 10*3/uL (ref 0.1–0.9)
Monocytes: 8 %
Neutrophils Absolute: 3 10*3/uL (ref 1.4–7.0)
Neutrophils: 56 %
Platelets: 242 10*3/uL (ref 150–450)
RBC: 4.7 x10E6/uL (ref 3.77–5.28)
RDW: 13.1 % (ref 11.7–15.4)
WBC: 5.3 10*3/uL (ref 3.4–10.8)

## 2021-08-10 ENCOUNTER — Ambulatory Visit: Payer: Medicare HMO

## 2021-08-16 DIAGNOSIS — G510 Bell's palsy: Secondary | ICD-10-CM | POA: Diagnosis not present

## 2021-08-16 DIAGNOSIS — B0221 Postherpetic geniculate ganglionitis: Secondary | ICD-10-CM | POA: Diagnosis not present

## 2021-08-16 DIAGNOSIS — H9202 Otalgia, left ear: Secondary | ICD-10-CM | POA: Diagnosis not present

## 2021-08-16 DIAGNOSIS — R42 Dizziness and giddiness: Secondary | ICD-10-CM | POA: Diagnosis not present

## 2021-08-18 ENCOUNTER — Encounter: Payer: Self-pay | Admitting: Gastroenterology

## 2021-08-29 ENCOUNTER — Other Ambulatory Visit: Payer: Self-pay | Admitting: Internal Medicine

## 2021-08-30 ENCOUNTER — Ambulatory Visit (INDEPENDENT_AMBULATORY_CARE_PROVIDER_SITE_OTHER): Payer: No Typology Code available for payment source | Admitting: *Deleted

## 2021-08-30 DIAGNOSIS — Z1231 Encounter for screening mammogram for malignant neoplasm of breast: Secondary | ICD-10-CM | POA: Diagnosis not present

## 2021-08-30 DIAGNOSIS — Z780 Asymptomatic menopausal state: Secondary | ICD-10-CM | POA: Diagnosis not present

## 2021-08-30 DIAGNOSIS — Z0000 Encounter for general adult medical examination without abnormal findings: Secondary | ICD-10-CM

## 2021-08-30 NOTE — Progress Notes (Signed)
Subjective:   Abigail Delacruz is a 71 y.o. female who presents for Medicare Annual (Subsequent) preventive examination.  I connected with  Joe L Kidd on 08/30/21 by a telephone enabled telemedicine application and verified that I am speaking with the correct person using two identifiers.   I discussed the limitations of evaluation and management by telemedicine. The patient expressed understanding and agreed to proceed.  Patient location: home  Provider location: Tele-Health-home    Review of Systems     Cardiac Risk Factors include: advanced age (>15mn, >>18women);obesity (BMI >30kg/m2)     Objective:    Today's Vitals   There is no height or weight on file to calculate BMI.     08/30/2021   12:46 PM 08/07/2020    3:20 PM 06/30/2020   10:43 AM 07/27/2019    8:42 AM 10/20/2017    9:47 AM 09/29/2017   10:01 AM 09/08/2017   10:30 AM  Advanced Directives  Does Patient Have a Medical Advance Directive? No No No No No Yes No  Type of Advance Directive      Healthcare Power of Attorney   Would patient like information on creating a medical advance directive? No - Patient declined  No - Patient declined Yes (MAU/Ambulatory/Procedural Areas - Information given) No - Patient declined No - Patient declined No - Patient declined    Current Medications (verified) Outpatient Encounter Medications as of 08/30/2021  Medication Sig   atorvastatin (LIPITOR) 40 MG tablet Take 1 tablet (40 mg total) by mouth daily.   Cholecalciferol (VITAMIN D3) 1.25 MG (50000 UT) TABS Take by mouth.   clonazePAM (KLONOPIN) 0.5 MG tablet Take 0.25 mg by mouth daily. Take 0.25 one time daily for one week then take 0.25 mg two times daily   Multiple Vitamins-Minerals (MULTIVITAMIN ADULT) CHEW Chew by mouth.   omeprazole (PRILOSEC) 20 MG capsule Take 1 capsule (20 mg total) by mouth daily.   lactobacillus acidophilus & bulgar (LACTINEX) chewable tablet Chew 1 tablet by mouth 3 (three) times daily with meals.  (Patient not taking: Reported on 08/30/2021)   No facility-administered encounter medications on file as of 08/30/2021.    Allergies (verified) Ciprodex [ciprofloxacin-dexamethasone]   History: Past Medical History:  Diagnosis Date   Cataract    Hyperlipidemia    Past Surgical History:  Procedure Laterality Date   APPENDECTOMY  1971   CATARACT EXTRACTION W/PHACO Right 09/08/2017   Procedure: CATARACT EXTRA[place]OC[place];  Surgeon: KEulogio Bear MD;  [place] [facility]  Service: Ophthalmology;  Laterality: Right;   CATARACT EXTRACTION W/PHACO Left 09/29/2017   Procedure: CATARACT EXTRACTION PHACO AND INTRAOCULAR LENS PLACEMENT ([place];  Surgeon: KEulogio Bear MD;  [place] [facility]  Service: Ophthalmology;  Laterality: Left;   COLONOSCOPY WITH PROPOFOL N/A 10/20/2017   Procedure: COLONOSCOPY WITH PROPOFOL;  Surgeon: WLucilla Lame MD;  [place] [facility]  Service: Endoscopy;  Laterality: N/A;   Family History  Problem Relation Age of Onset   Kidney failure Mother    Cirrhosis Father    Alcohol abuse Father    Diabetes Brother    Hypertension Brother    Arthritis Brother    Breast cancer Neg Hx    Social History   Socioeconomic History   Marital status: Married    Spouse name: Not on file   Number of children: 2   Years of education: Not on file   Highest education level: Some  college, no degree  Occupational History   Not on file  Tobacco Use   Smoking status: Former    Types: Cigarettes    Quit date: 1974    Years since quitting: 49.5   Smokeless tobacco: Never  Vaping Use   Vaping Use: Never used  Substance and Sexual Activity   Alcohol use: Yes    Comment: social 1x/yr   Drug use: Never   Sexual activity: Not Currently  Other Topics Concern   Not on file  Social History Narrative   Not on file   Social Determinants of Health   Financial Resource Strain: Low Risk   (08/30/2021)   Overall Financial Resource Strain (CARDIA)    Difficulty of Paying Living Expenses: Not hard at all  Food Insecurity: No Food Insecurity (08/30/2021)   Hunger Vital Sign    Worried About Running Out of Food in the Last Year: Never true    Ran Out of Food in the Last Year: Never true  Transportation Needs: No Transportation Needs (08/30/2021)   PRAPARE - Hydrologist (Medical): No    Lack of Transportation (Non-Medical): No  Physical Activity: Inactive (08/07/2020)   Exercise Vital Sign    Days of Exercise per Week: 0 days    Minutes of Exercise per Session: 0 min  Stress: No Stress Concern Present (08/30/2021)   [place]    Feeling of Stress : Not at all  Social Connections: Moderately Integrated (08/30/2021)   Social Connection and Isolation Panel [NHANES]    Frequency of Communication with Friends and Family: Not on file    Frequency of Social Gatherings with Friends and Family: Three times a week    Attends Religious Services: More than 4 times per year    Active Member of Clubs or Organizations: No    Attends Music therapist: Not on file    Marital Status: Married    Tobacco Counseling Counseling given: Not Answered   Clinical Intake:  Pre-visit preparation completed: Yes  Pain : No/denies pain     Nutritional Risks: None Diabetes: No  How often do you need to have someone help you when you read instructions, pamphlets, or other written materials from your doctor or pharmacy?: 1 - Never  Diabetic?  no  Interpreter Needed?: No  Information entered by :: Leroy Kennedy LPN   Activities of Daily Living    08/30/2021   12:44 PM  In your present state of health, do you have any difficulty performing the following activities:  Hearing? 0  Vision? 0  Difficulty concentrating or making decisions? 0  Walking or climbing stairs? 0  Dressing or bathing? 0   Doing errands, shopping? 0  Preparing Food and eating ? N  Using the Toilet? N  In the past six months, have you accidently leaked urine? N  Do you have problems with loss of bowel control? N  Managing your Medications? N  Managing your Finances? N  Housekeeping or managing your Housekeeping? N    Patient Care Team: Charlynne Cousins, MD as PCP - General (Internal Medicine)  Indicate any recent Medical Services you may have received from other than [facility] providers in the past year (date may be approximate).     Assessment:   This is a routine wellness examination for Abigail Delacruz.  Hearing/Vision screen Hearing Screening - Comments:: No trouble hearing Vision Screening - Comments:: Dr Edison Pace Up to date  Dietary  issues and exercise activities discussed: Current Exercise Habits: The patient does not participate in regular exercise at present   Goals Addressed             This Visit's Progress    Weight (lb) < 200 lb (90.7 kg)         Depression Screen    08/30/2021   12:45 PM 08/01/2021    9:03 AM 06/20/2021    8:14 AM 05/09/2021    8:58 AM 02/06/2021    8:53 AM 09/13/2020   10:34 AM 08/07/2020    3:21 PM  PHQ 2/9 Scores  PHQ - 2 Score 0 0 0 0 2 0 3  PHQ- 9 Score 0 2 1 0 2  6    Fall Risk    08/30/2021   12:34 PM 08/01/2021    9:03 AM 06/20/2021    8:14 AM 05/09/2021    8:58 AM 02/06/2021    8:53 AM  Fall Risk   Falls in the past year? 0 0 0 0 0  Number falls in past yr: 0 0 0 0 0  Injury with Fall? 0 0 0 0 0  Risk for fall due to :  No Fall Risks No Fall Risks No Fall Risks No Fall Risks  Follow up Falls evaluation completed;Education provided;Falls prevention discussed Falls evaluation completed Falls evaluation completed Falls evaluation completed Falls evaluation completed    FALL RISK PREVENTION PERTAINING TO THE HOME:  Any stairs in or around the home? No  If so, are there any without handrails? No  Home free of loose throw rugs in walkways, pet beds, electrical  cords, etc? Yes  Adequate lighting in your home to reduce risk of falls? Yes   ASSISTIVE DEVICES UTILIZED TO PREVENT FALLS:  Life alert? No  Use of a cane, walker or w/c? No  Grab bars in the bathroom? No  Shower chair or bench in shower? No  Elevated toilet seat or a handicapped toilet? No   TIMED UP AND GO:  Was the test performed? No .    Cognitive Function:        08/30/2021   12:36 PM 08/07/2020    3:26 PM 07/27/2019    8:43 AM  6CIT Screen  What Year? 0 points 0 points 0 points  What month? 0 points 0 points 0 points  What time? 0 points 0 points 0 points  Count back from 20 0 points 0 points 0 points  Months in reverse 0 points 0 points 0 points  Repeat phrase 0 points 0 points 0 points  Total Score 0 points 0 points 0 points    Immunizations Immunization History  Administered Date(s) Administered   PFIZER(Purple Top)SARS-COV-2 Vaccination 06/01/2019, 06/22/2019, 01/24/2020   Pneumococcal Conjugate-13 08/26/2017   Pneumococcal Polysaccharide-23 09/18/2018    TDAP status: Due, Education has been provided regarding the importance of this vaccine. Advised may receive this vaccine at local pharmacy or Health Dept. Aware to provide a copy of the vaccination record if obtained from local pharmacy or Health Dept. Verbalized acceptance and understanding.  Flu Vaccine status: Declined, Education has been provided regarding the importance of this vaccine but patient still declined. Advised may receive this vaccine at local pharmacy or Health Dept. Aware to provide a copy of the vaccination record if obtained from local pharmacy or Health Dept. Verbalized acceptance and understanding.  Pneumococcal vaccine status: Up to date  Covid-19 vaccine status: Information provided on how to obtain vaccines.   Qualifies  for Shingles Vaccine? Yes   Zostavax completed No   Shingrix Completed?: No.    Education has been provided regarding the importance of this vaccine. Patient has been  advised to call insurance company to determine out of pocket expense if they have not yet received this vaccine. Advised may also receive vaccine at local pharmacy or Health Dept. Verbalized acceptance and understanding.  Screening Tests Health Maintenance  Topic Date Due   TETANUS/TDAP  09/13/2021 (Originally 04/05/1969)   COVID-19 Vaccine (4 - Booster for Pfizer series) 09/15/2021 (Originally 03/20/2020)   Zoster Vaccines- Shingrix (1 of 2) 09/19/2021 (Originally 04/05/1969)   INFLUENZA VACCINE  09/25/2021   MAMMOGRAM  09/29/2022   COLONOSCOPY (Pts 45-72yr Insurance coverage will need to be confirmed)  10/21/2027   Pneumonia Vaccine 71 Years old  Completed   DEXA SCAN  Completed   Hepatitis C Screening  Completed   HPV VACCINES  Aged Out    Health Maintenance  There are no preventive care reminders to display for this patient.   Colorectal cancer screening: Type of screening: Colonoscopy. Completed 2019. Repeat every 10 years  Mammogram status: Ordered  . Pt provided with contact info and advised to call to schedule appt.   Bone Density status: Ordered  . Pt provided with contact info and advised to call to schedule appt.  Lung Cancer Screening: (Low Dose CT Chest recommended if Age 71-80years, 30 pack-year currently smoking OR have quit w/in 15years.) does not qualify.   Lung Cancer Screening Referral:   Additional Screening:  Hepatitis C Screening: does not qualify; Completed 2023  Vision Screening: Recommended annual ophthalmology exams for early detection of glaucoma and other disorders of the eye. Is the patient up to date with their annual eye exam?  Yes  Who is the provider or what is the name of the office in which the patient attends annual eye exams? Dr. KEdison PaceIf pt is not established with a provider, would they like to be referred to a provider to establish care? No .   Dental Screening: Recommended annual dental exams for proper oral hygiene  Community Resource  Referral / Chronic Care Management: CRR required this visit?  No   CCM required this visit?  No      Plan:     I have personally reviewed and noted the following in the patient's chart:   Medical and social history Use of alcohol, tobacco or illicit drugs  Current medications and supplements including opioid prescriptions.  Functional ability and status Nutritional status Physical activity Advanced directives List of other physicians Hospitalizations, surgeries, and ER visits in previous 12 months Vitals Screenings to include cognitive, depression, and falls Referrals and appointments  In addition, I have reviewed and discussed with patient certain preventive protocols, quality metrics, and best practice recommendations. A written personalized care plan for preventive services as well as general preventive health recommendations were provided to patient.     JLeroy Kennedy LPN   [id number]  Nurse Notes:

## 2021-08-30 NOTE — Patient Instructions (Signed)
Abigail Delacruz , Thank you for taking time to come for your Medicare Wellness Visit. I appreciate your ongoing commitment to your health goals. Please review the following plan we discussed and let me know if I can assist you in the future.   Screening recommendations/referrals: Colonoscopy: up to date Mammogram: Education provided Bone Density: Education provided Recommended yearly ophthalmology/optometry visit for glaucoma screening and checkup Recommended yearly dental visit for hygiene and checkup  Vaccinations:  Pneumococcal vaccine: up to date Tdap vaccine: Education provided Shingles vaccine: Education provided    Advanced directives: Education provided  Conditions/risks identified:      Preventive Care 71 Years and Older, Female Preventive care refers to lifestyle choices and visits with your health care provider that can promote health and wellness. What does preventive care include? A yearly physical exam. This is also called an annual well check. Dental exams once or twice a year. Routine eye exams. Ask your health care provider how often you should have your eyes checked. Personal lifestyle choices, including: Daily care of your teeth and gums. Regular physical activity. Eating a healthy diet. Avoiding tobacco and drug use. Limiting alcohol use. Practicing safe sex. Taking low-dose aspirin every day. Taking vitamin and mineral supplements as recommended by your health care provider. What happens during an annual well check? The services and screenings done by your health care provider during your annual well check will depend on your age, overall health, lifestyle risk factors, and family history of disease. Counseling  Your health care provider may ask you questions about your: Alcohol use. Tobacco use. Drug use. Emotional well-being. Home and relationship well-being. Sexual activity. Eating habits. History of falls. Memory and ability to understand  (cognition). Work and work Statistician. Reproductive health. Screening  You may have the following tests or measurements: Height, weight, and BMI. Blood pressure. Lipid and cholesterol levels. These may be checked every 5 years, or more frequently if you are over 60 years old. Skin check. Lung cancer screening. You may have this screening every year starting at age 87 if you have a 30-pack-year history of smoking and currently smoke or have quit within the past 15 years. Fecal occult blood test (FOBT) of the stool. You may have this test every year starting at age 59. Flexible sigmoidoscopy or colonoscopy. You may have a sigmoidoscopy every 5 years or a colonoscopy every 10 years starting at age 88. Hepatitis C blood test. Hepatitis B blood test. Sexually transmitted disease (STD) testing. Diabetes screening. This is done by checking your blood sugar (glucose) after you have not eaten for a while (fasting). You may have this done every 1-3 years. Bone density scan. This is done to screen for osteoporosis. You may have this done starting at age 34. Mammogram. This may be done every 1-2 years. Talk to your health care provider about how often you should have regular mammograms. Talk with your health care provider about your test results, treatment options, and if necessary, the need for more tests. Vaccines  Your health care provider may recommend certain vaccines, such as: Influenza vaccine. This is recommended every year. Tetanus, diphtheria, and acellular pertussis (Tdap, Td) vaccine. You may need a Td booster every 10 years. Zoster vaccine. You may need this after age 43. Pneumococcal 13-valent conjugate (PCV13) vaccine. One dose is recommended after age 37. Pneumococcal polysaccharide (PPSV23) vaccine. One dose is recommended after age 63. Talk to your health care provider about which screenings and vaccines you need and how often you need  them. This information is not intended to  replace advice given to you by your health care provider. Make sure you discuss any questions you have with your health care provider. Document Released: 03/10/2015 Document Revised: 11/01/2015 Document Reviewed: 12/13/2014 Elsevier Interactive Patient Education  2017 [place] Prevention in the Home Falls can cause injuries. They can happen to people of all ages. There are many things you can do to make your home safe and to help prevent falls. What can I do on the outside of my home? Regularly fix the edges of walkways and driveways and fix any cracks. Remove anything that might make you trip as you walk through a door, such as a raised step or threshold. Trim any bushes or trees on the path to your home. Use bright outdoor lighting. Clear any walking paths of anything that might make someone trip, such as rocks or tools. Regularly check to see if handrails are loose or broken. Make sure that both sides of any steps have handrails. Any raised decks and porches should have guardrails on the edges. Have any leaves, snow, or ice cleared regularly. Use sand or salt on walking paths during winter. Clean up any spills in your garage right away. This includes oil or grease spills. What can I do in the bathroom? Use night lights. Install grab bars by the toilet and in the tub and shower. Do not use towel bars as grab bars. Use non-skid mats or decals in the tub or shower. If you need to sit down in the shower, use a plastic, non-slip stool. Keep the floor dry. Clean up any water that spills on the floor as soon as it happens. Remove soap buildup in the tub or shower regularly. Attach bath mats securely with double-sided non-slip rug tape. Do not have throw rugs and other things on the floor that can make you trip. What can I do in the bedroom? Use night lights. Make sure that you have a light by your bed that is easy to reach. Do not use any sheets or blankets that are too big for  your bed. They should not hang down onto the floor. Have a firm chair that has side arms. You can use this for support while you get dressed. Do not have throw rugs and other things on the floor that can make you trip. What can I do in the kitchen? Clean up any spills right away. Avoid walking on wet floors. Keep items that you use a lot in easy-to-reach places. If you need to reach something above you, use a strong step stool that has a grab bar. Keep electrical cords out of the way. Do not use floor polish or wax that makes floors slippery. If you must use wax, use non-skid floor wax. Do not have throw rugs and other things on the floor that can make you trip. What can I do with my stairs? Do not leave any items on the stairs. Make sure that there are handrails on both sides of the stairs and use them. Fix handrails that are broken or loose. Make sure that handrails are as long as the stairways. Check any carpeting to make sure that it is firmly attached to the stairs. Fix any carpet that is loose or worn. Avoid having throw rugs at the top or bottom of the stairs. If you do have throw rugs, attach them to the floor with carpet tape. Make sure that you have a light switch at  the top of the stairs and the bottom of the stairs. If you do not have them, ask someone to add them for you. What else can I do to help prevent falls? Wear shoes that: Do not have high heels. Have rubber bottoms. Are comfortable and fit you well. Are closed at the toe. Do not wear sandals. If you use a stepladder: Make sure that it is fully opened. Do not climb a closed stepladder. Make sure that both sides of the stepladder are locked into place. Ask someone to hold it for you, if possible. Clearly mark and make sure that you can see: Any grab bars or handrails. First and last steps. Where the edge of each step is. Use tools that help you move around (mobility aids) if they are needed. These  include: Canes. Walkers. Scooters. Crutches. Turn on the lights when you go into a dark area. Replace any light bulbs as soon as they burn out. Set up your furniture so you have a clear path. Avoid moving your furniture around. If any of your floors are uneven, fix them. If there are any pets around you, be aware of where they are. Review your medicines with your doctor. Some medicines can make you feel dizzy. This can increase your chance of falling. Ask your doctor what other things that you can do to help prevent falls. This information is not intended to replace advice given to you by your health care provider. Make sure you discuss any questions you have with your health care provider. Document Released: 12/08/2008 Document Revised: 07/20/2015 Document Reviewed: 03/18/2014 Elsevier Interactive Patient Education  2017 [employer].

## 2021-09-04 ENCOUNTER — Telehealth: Payer: Self-pay

## 2021-09-04 MED ORDER — OMEPRAZOLE 20 MG PO CPDR: 20.0000 mg | DELAYED_RELEASE_CAPSULE | Freq: Every day | ORAL | 3 refills | Status: DC

## 2021-09-04 NOTE — Addendum Note (Signed)
Addended by: Jon Billings on: 09/04/2021 10:32 AM   Modules accepted: Orders

## 2021-09-04 NOTE — Telephone Encounter (Signed)
Medication refill rx from [facility] for Omeprzole 20 MG. Fill date 05/30/21 with 3 refills. Please advise.

## 2021-09-11 ENCOUNTER — Telehealth: Payer: Self-pay

## 2021-09-11 ENCOUNTER — Encounter: Payer: Self-pay | Admitting: Cardiology

## 2021-09-11 ENCOUNTER — Ambulatory Visit (INDEPENDENT_AMBULATORY_CARE_PROVIDER_SITE_OTHER): Payer: No Typology Code available for payment source | Admitting: Cardiology

## 2021-09-11 VITALS — BP 121/85 | HR 72 | Ht 62.0 in | Wt 188.2 lb

## 2021-09-11 DIAGNOSIS — E7800 Pure hypercholesterolemia, unspecified: Secondary | ICD-10-CM | POA: Diagnosis not present

## 2021-09-11 DIAGNOSIS — R42 Dizziness and giddiness: Secondary | ICD-10-CM

## 2021-09-11 NOTE — Progress Notes (Signed)
Cardiology Office Note:    Date:  09/11/2021   ID:  Abigail Delacruz, DOB 11/06/1950, MRN [id number]  PCP:  Charlynne Cousins, MD   [place] Providers Cardiologist:  None     Referring MD: Charlynne Cousins, MD   Chief Complaint  Patient presents with   Dizziness    Patient states that she feels dizziness today. Patient states that her feet swell when she travels. Meds reviewed with patient.     History of Present Illness:    Abigail Delacruz is a 71 y.o. female with a hx of hyperlipidemia who presents due to dizziness.  She was diagnosed with Ramsay Hunt syndrome May 2022/about 1 year ago.  Patient had facial palsy, and symptoms of dizziness since then.  Left facial palsy has since improved, symptoms of dizziness have persisted.  Dizziness usually or course when she bends over, or while walking.  Denies palpitations, chest pain, shortness of breath, any history of heart disease.  Past Medical History:  Diagnosis Date   Cataract    Hyperlipidemia     Past Surgical History:  Procedure Laterality Date   APPENDECTOMY  1971   CATARACT EXTRACTION W/PHACO Right 09/08/2017   Procedure: CATARACT EXTRACTION PHACO AND INTRAOCULAR LENS PLACEMENT ([place])  RIGHT;  Surgeon: Eulogio Bear, MD;  Location: [place];  Service: Ophthalmology;  Laterality: Right;   CATARACT EXTRACTION W/PHACO Left 09/29/2017   Procedure: CATARACT EXTRACTION PHACO AND INTRAOCULAR LENS PLACEMENT ([place]) LEFT;  Surgeon: Eulogio Bear, MD;  Location: [facility];  Service: Ophthalmology;  Laterality: Left;   COLONOSCOPY WITH PROPOFOL N/A 10/20/2017   Procedure: COLONOSCOPY WITH PROPOFOL;  Surgeon: Lucilla Lame, MD;  Location: [place];  Service: Endoscopy;  Laterality: N/A;    Current Medications: Current Meds  Medication Sig   atorvastatin (LIPITOR) 40 MG tablet Take 1 tablet (40 mg total) by mouth daily.   CALCIUM-MAGNESIUM-ZINC PO Take 1 tablet by mouth daily.    Cholecalciferol (VITAMIN D3) 1.25 MG (50000 UT) TABS Take by mouth.   clonazePAM (KLONOPIN) 0.5 MG tablet Take 0.25 mg by mouth daily. Take 0.25 one time daily for one week then take 0.25 mg two times daily   Multiple Vitamins-Minerals (MULTIVITAMIN ADULT) CHEW Chew by mouth.   omeprazole (PRILOSEC) 20 MG capsule Take 1 capsule (20 mg total) by mouth daily.     Allergies:   Ciprodex [ciprofloxacin-dexamethasone]   Social History   Socioeconomic History   Marital status: Married    Spouse name: Not on file   Number of children: 2   Years of education: Not on file   Highest education level: Some college, no degree  Occupational History   Not on file  Tobacco Use   Smoking status: Former    Types: Cigarettes    Quit date: 1973    Years since quitting: 50.5   Smokeless tobacco: Never  Vaping Use   Vaping Use: Never used  Substance and Sexual Activity   Alcohol use: Yes    Comment: social 1x/yr   Drug use: Never   Sexual activity: Not Currently  Other Topics Concern   Not on file  Social History Narrative   Not on file   Social Determinants of Health   Financial Resource Strain: Low Risk  (08/30/2021)   Overall Financial Resource Strain (CARDIA)    Difficulty of Paying Living Expenses: Not hard at all  Food Insecurity: No Food Insecurity (08/30/2021)   Hunger Vital Sign    Worried  About Running Out of Food in the Last Year: Never true    Ran Out of Food in the Last Year: Never true  Transportation Needs: No Transportation Needs (08/30/2021)   PRAPARE - Hydrologist (Medical): No    Lack of Transportation (Non-Medical): No  Physical Activity: Inactive (08/07/2020)   Exercise Vital Sign    Days of Exercise per Week: 0 days    Minutes of Exercise per Session: 0 min  Stress: No Stress Concern Present (08/30/2021)   [place]    Feeling of Stress : Not at all  Social Connections:  Moderately Integrated (08/30/2021)   Social Connection and Isolation Panel [NHANES]    Frequency of Communication with Friends and Family: Not on file    Frequency of Social Gatherings with Friends and Family: Three times a week    Attends Religious Services: More than 4 times per year    Active Member of Clubs or Organizations: No    Attends Music therapist: Not on file    Marital Status: Married     Family History: The patient's family history includes Alcohol abuse in her father; Arthritis in her brother; Cirrhosis in her father; Diabetes in her brother; Hypertension in her brother; Kidney failure in her mother. There is no history of Breast cancer.  ROS:   Please see the history of present illness.     All other systems reviewed and are negative.  EKGs/Labs/Other Studies Reviewed:    The following studies were reviewed today:   EKG:  EKG is  ordered today.  The ekg ordered today demonstrates normal sinus rhythm,  Recent Labs: 02/06/2021: TSH 2.090 08/01/2021: ALT 39; BUN 19; Creatinine, Ser 0.67; Hemoglobin 14.2; Platelets 242; Potassium 4.4; Sodium 141  Recent Lipid Panel    Component Value Date/Time   CHOL 148 06/13/2021 0825   TRIG 125 06/13/2021 0825   HDL 48 06/13/2021 0825   CHOLHDL 3.1 06/13/2021 0825   CHOLHDL 2.6 05/27/2019 0905   LDLCALC 78 06/13/2021 0825   LDLCALC 66 05/27/2019 0905     Risk Assessment/Calculations:          Physical Exam:    VS:  BP 121/85 (BP Location: Left Arm, Patient Position: Sitting, Cuff Size: Normal)   Pulse 72   Ht '5\' 2"'$  (1.575 m)   Wt 188 lb 3.2 oz (85.4 kg)   SpO2 96%   BMI 34.42 kg/m     Wt Readings from Last 3 Encounters:  09/11/21 188 lb 3.2 oz (85.4 kg)  08/01/21 188 lb 6.4 oz (85.5 kg)  07/16/21 191 lb (86.6 kg)     GEN:  Well nourished, well developed in no acute distress HEENT: Normal NECK: No JVD; No carotid bruits LYMPHATICS: No lymphadenopathy CARDIAC: RRR, no murmurs, rubs,  gallops RESPIRATORY:  Clear to auscultation without rales, wheezing or rhonchi  ABDOMEN: Soft, non-tender, non-distended MUSCULOSKELETAL:  No edema; No deformity  SKIN: Warm and dry NEUROLOGIC:  Alert and oriented x 3 PSYCHIATRIC:  Normal affect   ASSESSMENT:    1. Dizziness   2. Pure hypercholesterolemia    PLAN:    In order of problems listed above:  Dizziness, orthostatic vitals in the office with no evidence for orthostasis.  Symptoms of dizziness occurred after Ramsay Hunt's/herpes zoster infection.  Symptoms are not consistent with a cardiac etiology of dizziness.  Recommend referral to ENT.  Plans to start vestibular therapy as per PCP.  No additional cardiac testing indicated. Hyperlipidemia, cholesterol controlled, continue Lipitor.   Follow-up as needed       Medication Adjustments/Labs and Tests Ordered: Current medicines are reviewed at length with the patient today.  Concerns regarding medicines are outlined above.  Orders Placed This Encounter  Procedures   EKG 12-Lead   No orders of the defined types were placed in this encounter.   Patient Instructions  Medication Instructions:   Your physician recommends that you continue on your current medications as directed. Please refer to the Current Medication list given to you today.  *If you need a refill on your cardiac medications before your next appointment, please call your pharmacy*    Follow-Up: At [facility], you and your health needs are our priority.  As part of our continuing mission to provide you with exceptional heart care, we have created designated Provider Care Teams.  These Care Teams include your primary Cardiologist (physician) and Advanced Practice Providers (APPs -  Physician Assistants and Nurse Practitioners) who all work together to provide you with the care you need, when you need it.  We recommend signing up for the patient portal called "MyChart".  Sign up information is provided  on this After Visit Summary.  MyChart is used to connect with patients for Virtual Visits (Telemedicine).  Patients are able to view lab/test results, encounter notes, upcoming appointments, etc.  Non-urgent messages can be sent to your provider as well.   To learn more about what you can do with MyChart, go to [email or web address].    Your next appointment:   Follow up as needed   The format for your next appointment:   In Person  Provider:   Kate Sable, MD    Other Instructions   Important Information About Sugar         Signed, Kate Sable, MD  09/11/2021 11:11 AM    [place]

## 2021-09-11 NOTE — Patient Instructions (Signed)
Medication Instructions:   Your physician recommends that you continue on your current medications as directed. Please refer to the Current Medication list given to you today.  *If you need a refill on your cardiac medications before your next appointment, please call your pharmacy*    Follow-Up: At [facility], you and your health needs are our priority.  As part of our continuing mission to provide you with exceptional heart care, we have created designated Provider Care Teams.  These Care Teams include your primary Cardiologist (physician) and Advanced Practice Providers (APPs -  Physician Assistants and Nurse Practitioners) who all work together to provide you with the care you need, when you need it.  We recommend signing up for the patient portal called "MyChart".  Sign up information is provided on this After Visit Summary.  MyChart is used to connect with patients for Virtual Visits (Telemedicine).  Patients are able to view lab/test results, encounter notes, upcoming appointments, etc.  Non-urgent messages can be sent to your provider as well.   To learn more about what you can do with MyChart, go to [email or web address].    Your next appointment:   Follow up as needed   The format for your next appointment:   In Person  Provider:   Kate Sable, MD    Other Instructions   Important Information About Sugar

## 2021-09-11 NOTE — Telephone Encounter (Signed)
Copied from [place] [id number]. Topic: Referral - Request for Referral >> Sep 11, 2021  3:32 PM Leilani Able wrote: Has patient seen PCP for this complaint? Yes.   *If NO, is insurance requiring patient see PCP for this issue before PCP can refer them? Referral for which specialty: ENT Preferred provider/office:[facility]  Reason for referral: Pt was sent by Dr Neomia Dear to Heart Dr and saw him today. He states that there is nothing wrong with her heart that he feels she needs a ENT dr asap. Please refer pt to ENTas she is still very dizzy. Call her to advise [phone]. Call pt also voiced concern that her dr was not in contact with her re meds that were prescribed, she was not taking as had not known they were called in, advised by heart dr.  She wanted office to know she has not been very happy. Needs referral asap. As been dizzy for 1 yr.

## 2021-09-12 NOTE — Telephone Encounter (Signed)
Patient has been notified of referral placed.

## 2021-09-24 DIAGNOSIS — H903 Sensorineural hearing loss, bilateral: Secondary | ICD-10-CM | POA: Diagnosis not present

## 2021-09-24 DIAGNOSIS — R42 Dizziness and giddiness: Secondary | ICD-10-CM | POA: Diagnosis not present

## 2021-09-24 DIAGNOSIS — H90A22 Sensorineural hearing loss, unilateral, left ear, with restricted hearing on the contralateral side: Secondary | ICD-10-CM | POA: Diagnosis not present

## 2021-09-24 NOTE — Therapy (Signed)
OUTPATIENT PHYSICAL THERAPY VESTIBULAR EVALUATION     Patient Name: Abigail Delacruz MRN: [id number] DOB:Mar 05, 1950, 71 y.o., female Today's Date: 09/25/2021  PCP: Dr. Charlynne Cousins REFERRING PROVIDER: Dr. Gurney Maxin   PT End of Session - 09/25/21 0812     Visit Number 1    Number of Visits 24    Date for PT Re-Evaluation 12/18/21    PT Start Time 0813    PT Stop Time 0916    PT Time Calculation (min) 63 min    Equipment Utilized During Treatment Gait belt    Activity Tolerance Patient tolerated treatment well    Behavior During Therapy [facility] for tasks assessed/performed             Past Medical History:  Diagnosis Date   Cataract    Hyperlipidemia    Past Surgical History:  Procedure Laterality Date   APPENDECTOMY  1971   CATARACT EXTRACTION W/PHACO Right 09/08/2017   Procedure: CATARACT EXTRACTION PHACO AND INTRAOCULAR LENS PLACEMENT ([place])  RIGHT;  Surgeon: Eulogio Bear, MD;  Location: [place];  Service: Ophthalmology;  Laterality: Right;   CATARACT EXTRACTION W/PHACO Left 09/29/2017   Procedure: CATARACT EXTRACTION PHACO AND INTRAOCULAR LENS PLACEMENT ([place]) LEFT;  Surgeon: Eulogio Bear, MD;  Location: [place];  Service: Ophthalmology;  Laterality: Left;   COLONOSCOPY WITH PROPOFOL N/A 10/20/2017   Procedure: COLONOSCOPY WITH PROPOFOL;  Surgeon: Lucilla Lame, MD;  Location: [place];  Service: Endoscopy;  Laterality: N/A;   Patient Active Problem List   Diagnosis Date Noted   Hepatic cyst 06/20/2021   Elevated alkaline phosphatase level 06/20/2021   Annual physical exam 02/06/2021   Onychomycosis 02/06/2021   Dizziness 12/04/2020   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   Herpes zoster with ophthalmic complication 46/65/9935   Acute actinic otitis externa, left ear 06/30/2020   Facial cellulitis 06/30/2020   Hyperlipidemia    GERD (gastroesophageal reflux disease) 06/26/2020   Hyperlipidemia, mixed 10/01/2017    Chronic left shoulder pain 05/23/2017    ONSET DATE: 06/29/2020  REFERRING DIAG: R42 (ICD-10-CM) - Dizziness  THERAPY DIAG:  Dizziness and giddiness  Unsteadiness on feet  Rationale for Evaluation and Treatment Rehabilitation  SUBJECTIVE:   SUBJECTIVE STATEMENT: Patient states that her face is better but that she has continued to have dizziness and unsteadiness since having Ramsay Hunt syndrome Jun 29, 2020.  Pt accompanied by: self  PERTINENT HISTORY:  Pt states that on Jun 29, 2020 she went to the ER and was found to have Ramsay Hunt 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 Pt states that her face is better. Pt states I have done everything they have told me to do. Pt states she was seen at [place] for her walking and balance issues and that this did help. Pt states she could not drive in the beginning. Pt states after PT she felt more secure and was able to drive about 4 or 5 months after onset. Pt reports she is now driving without issues. Patient states she has been to see a heart, liver, eye doctor. She was referred from the cardiologist to see Dr/ Richardson Landry. ENT physician. She saw Dr. Richardson Landry yesterday. Pt states they found some hearing loss in the left ear and states the tests for crystals was negative at the ENT office. Pt states she has an appointment with Dr. Melrose Nakayama tomorrow. Pt states she tried Clonopin, but that made her sleepy and did not help with her symptoms.  Pt states she wants to get better and have less dizziness. Pt states she is having dizziness daily. Pt states when she gets up she feels fine but states when she gets up and starts walking and moving she gets dizzy. Pt states she touches the wall for balance. Pt states that it is more difficult to walk in the grocery store and states she needs to hold the grocery cart. Patient states that she is not able to go on a walk outside for exercise with her husband unless she were to hold his hand for balance. Pt reports taht  she has had 2 MRI brain scans in the past year and that her cranial nerve#7 is still swollen. Pt states her left inner ear still feels swollen.  Per cardiologist's visit note: She was diagnosed with Ramsay Hunt syndrome May 2022/about 1 year ago.  Patient had facial palsy, and symptoms of dizziness since then.  Left facial palsy has since improved, symptoms of dizziness have persisted.  Dizziness usually or course when she bends over, or while walking.  Denies palpitations, chest pain, shortness of breath, any history of heart disease. Dizziness, orthostatic vitals in the office with no evidence for orthostasis.  Symptoms of dizziness occurred after Ramsay Hunt's/herpes zoster infection.  Symptoms are not consistent with a cardiac etiology of dizziness.   SYMPTOM BEHAVIOR: Non-Vestibular symptoms: changes in hearing Type of dizziness: Imbalance (Disequilibrium) and Unsteady with head/body turns Frequency: daily  Duration:    Aggravating factors: Induced by motion: occur when walking, looking up at the ceiling, bending down to the ground, turning body quickly, turning head quickly, and activity in general Relieving factors: avoid busy/distracting environments and sitting still Progression of symptoms: better History of similar episodes: none  Description of dizziness: unsteadiness, sleepiness Symptom nature: motion provoked, intermittent Auditory complaints (tinnitus, pain, drainage): denies pain; reports mild hearing loss in left ear Vision (last eye exam, diplopia, recent changes): pt wears glasses    PAIN:  Are you having pain? No  PRECAUTIONS: Fall  WEIGHT BEARING RESTRICTIONS No  FALLS: Has patient fallen in last 6 months? No  LIVING ENVIRONMENT: Lives with: lives with their spouse Lives in: House/apartment Stairs:  2-3 steps to get into the sunroom at the back of the ho which is what she uses to enter the home and she states she holds the door as this helps her feel safe  entering Has following equipment at home: None  PLOF: Independent, Independent with community mobility without device, Vocation/Vocational requirements: self-employed, and Leisure: travel, swim  [place] Pt would like to be able to walk better without imbalance, to be able to shop in grocery store without symptoms, be able to swim  OBJECTIVE/ VESTIBULAR ASSESSMENT:  COGNITION: Overall cognitive status: Within functional limits for tasks assessed   SENSATION: WFL  COORDINATION: Finger to Nose:  Normal Past Pointing: Normal  POSTURE: No Significant postural limitations  Cervical ROM:   WFL and painless in all planes. No gross deficits identified  MMT: deferred  TRANSFERS: Sit to stand: Complete Independence Stand to sit: Complete Independence  GAIT: Gait pattern: decreased step length- Right and decreased step length- Left  Scanning of visual environment with gait is: decreased Assistive device utilized: None Level of assistance: SBA Comments: pt had several episodes of veering, decreased gait speed or stopping due to dizziness/imbalance, increased sway at times and reaching for support   PATIENT SURVEYS:  ABC scale 45.6% DHI 42/100 FOTO 45  ORTHOSTATICS: not done  OCULOMOTOR EXAM:  Ocular Alignment: normal  Ocular ROM: No Limitations  Spontaneous Nystagmus: absent  Gaze-Induced Nystagmus: right beating with right gaze  Smooth Pursuits: intact  Saccades: hypometric/undershoots  Convergence/Divergence: deferred  VESTIBULAR - OCULAR REFLEX:   VOR Cancellation: Normal  Head-Impulse Test: HIT Right: positive HIT Left: positive  Dynamic Visual Acuity:  deferred  POSTURAL CONTROL TESTS:  Clinical Test of Sensory Interaction for Balance (CTSIB):  CONDITION TIME STRATEGY SWAY  Eyes open, firm surface 30 seconds ankle +2  Eyes closed, firm surface 30 seconds ankle +3  Eyes open, foam surface 30 seconds ankle +2  Eyes closed, foam surface 30 seconds Ankle,  reaching +4 less than 5 seconds     POSITIONAL TESTING: Other: deferred secondary to had performed at Dr. Reola Mosher office, ENT physician, yesterday and tests were negative per pt report  OCULOMOTOR / VESTIBULAR TESTING: Oculomotor Exam- Room Light  Normal Abnormal Comments  Ocular Alignment N    Ocular ROM N    Spontaneous Nystagmus N    Gaze evoked Nystagmus  ABN With right field gaze noted right beating nystagmus  Smooth Pursuit N    Saccades  ABN Slight hypometric saccades noted in left and right fields.  VOR  ABN   VOR Cancellation N    Left Head Impulse  ABN   Right Head Impulse  ABN     [facility] PT Assessment - 09/25/21 0845       Standardized Balance Assessment   Standardized Balance Assessment Dynamic Gait Index      Dynamic Gait Index   Level Surface Mild Impairment    Change in Gait Speed Moderate Impairment    Gait with Horizontal Head Turns Moderate Impairment    Gait with Vertical Head Turns Severe Impairment    Gait and Pivot Turn Mild Impairment    Step Over Obstacle Moderate Impairment    Step Around Obstacles Mild Impairment    Steps Moderate Impairment    Total Score 10            FUNCTIONAL GAIT: Dynamic Gait Index: 10/24  FUNCTIONAL OUTCOME MEASURES:  Results Comments  DHI 42/100 Moderate perception of handicap; in need of intervention  ABC Scale 45% Falls risk; in need of intervention  DGI 10/24 Falls risk; in need of intervention  FOTO 45 scale (39-70) with higher numbers indicating greater function Given the patient's risk adjustment variables, like-patients nationally had a FS score of  51/100 at intake  Dizziness Functional Status (DFS) 50.8 Scale 27-67; in need of intervention    VESTIBULAR TREATMENT:  Gaze Adaptation:   x1 Viewing Horizontal: Position: sitting, Time: 1 minute, Reps: 2, and Comment: verbal cues for technique   Non-compliant/ Firm Surface: On firm surface, patient performed feet together progressions and semi-tandem  progressions with alternating lead leg with and without horizontal and vertical head turns with CGA.  Patient challenged by semi-tandem stance with head turning activity. Added these exercises to HEP.   PATIENT EDUCATION: Education details: Discussed plan of care and goals; issued vestibular handout slides 1,3, 4, 5 for HEP.  Person educated: Patient Education method: Explanation, Demonstration, Verbal cues, and Handouts Education comprehension: verbalized understanding and returned demonstration   GOALS: Goals reviewed with patient? Yes  SHORT TERM GOALS: Target date: 11/06/2021   Pt will be independent with HEP in order to improve balance and decrease dizziness symptoms in order to decrease fall risk and improve function at home and work. Baseline: Goal status: INITIAL  2.  Pt will self-report that she has  been able to resume prior activities such as going for a walk outside and being able to shop at the grocery store without imbalance. Baseline: pt states she has to hold her husband's hand to be able to walk outside in the woods and reports she used a cart and had difficulty shopping recently due to her symptoms;  Goal status: INITIAL  LONG TERM GOALS: Target date: 12/18/2021    Patient will have improved FOTO score of 6 points or greater in order to demonstrate improvements in patient's ADLs and functional performance.  Baseline: 45 on 09/25/21; Goal status: INITIAL  2.  Patient will demonstrate reduced falls risk as evidenced by Dynamic Gait Index (DGI) >19/24. Baseline: 10/24 on 09/25/21; Goal status: INITIAL  3.  Patient will reduce falls risk as indicated by Activities Specific Balance Confidence Scale (ABC) >67%.      Patient will have demonstrate decreased falls risk as indicated by Activities Specific Balance Confidence Scale score of 80% or greater. Baseline: 45.6% on 09/25/21; Goal status: INITIAL  4.  Patient will reduce perceived disability to low levels as indicated by  <40 on Dizziness Handicap Inventory. Baseline: 42/100 on 09/25/21;  Goal status: INITIAL  5.  Patient will report 50% or greater improvement in their symptoms of dizziness and imbalance with provoking motions.  Baseline: at eval, reports walking with head turns and bending over increase symptoms;  Goal status: INITIAL   ASSESSMENT:  CLINICAL IMPRESSION: Patient is a pleasant 70 year-old female referred for difficulty with dizziness and imbalance. PT examination reveals listed functional deficits including decreased balance, difficulty walking and dizziness. Pt scored 45.6% on the ABC scale which indicates falls risk. Pt scored 42/100 moderate perception of handicap on the Dizziness Handicap Inventory. Pt scored 10/24 on the DGI which indicates falls risk. Pt will benefit from skilled vestibular PT services to address functional deficits and goals in order to try to decrease risk falls and to try to decrease symptoms of imbalance.   OBJECTIVE IMPAIRMENTS decreased balance, difficulty walking, and dizziness.   ACTIVITY LIMITATIONS bending, stairs, and locomotion level  PARTICIPATION LIMITATIONS: cleaning, shopping, and walking outside  PERSONAL FACTORS Time since onset of injury/illness/exacerbation and 1-2 comorbidities: Ramsay Hunt syndrome, hyperlipidemia  are also affecting patient's functional outcome.   REHAB POTENTIAL: Good  CLINICAL DECISION MAKING: Evolving/moderate complexity  EVALUATION COMPLEXITY: Moderate   PLAN: PT FREQUENCY: 1-2x/week  PT DURATION: 12 weeks  PLANNED INTERVENTIONS: Therapeutic exercises, Therapeutic activity, Neuromuscular re-education, Balance training, Gait training, Patient/Family education, Self Care, Stair training, Vestibular training, and Canalith repositioning  PLAN FOR NEXT SESSION: Review HEP and work on progressions, ambulation in hallway scanning for targets and amb with head turns.   Lady Deutscher, PT 09/25/2021, 9:53 AM

## 2021-09-25 ENCOUNTER — Ambulatory Visit: Payer: No Typology Code available for payment source | Attending: Neurology | Admitting: Physical Therapy

## 2021-09-25 ENCOUNTER — Encounter: Payer: Self-pay | Admitting: Physical Therapy

## 2021-09-25 DIAGNOSIS — R42 Dizziness and giddiness: Secondary | ICD-10-CM | POA: Diagnosis not present

## 2021-09-25 DIAGNOSIS — R2681 Unsteadiness on feet: Secondary | ICD-10-CM | POA: Diagnosis not present

## 2021-09-26 DIAGNOSIS — B0221 Postherpetic geniculate ganglionitis: Secondary | ICD-10-CM | POA: Diagnosis not present

## 2021-09-26 DIAGNOSIS — G510 Bell's palsy: Secondary | ICD-10-CM | POA: Diagnosis not present

## 2021-09-26 DIAGNOSIS — H9202 Otalgia, left ear: Secondary | ICD-10-CM | POA: Diagnosis not present

## 2021-09-26 DIAGNOSIS — R42 Dizziness and giddiness: Secondary | ICD-10-CM | POA: Diagnosis not present

## 2021-10-01 ENCOUNTER — Encounter: Payer: Self-pay | Admitting: Physical Therapy

## 2021-10-01 ENCOUNTER — Ambulatory Visit: Payer: No Typology Code available for payment source | Admitting: Physical Therapy

## 2021-10-01 DIAGNOSIS — R42 Dizziness and giddiness: Secondary | ICD-10-CM | POA: Diagnosis not present

## 2021-10-01 DIAGNOSIS — R2681 Unsteadiness on feet: Secondary | ICD-10-CM

## 2021-10-01 NOTE — Therapy (Signed)
OUTPATIENT PHYSICAL THERAPY VESTIBULAR EVALUATION     Patient Name: Abigail Delacruz MRN: [id number] DOB:[birth year], 71 y.o., female Today's Date: 10/01/2021  PCP: Dr. Charlynne Cousins REFERRING PROVIDER: Dr. Gurney Maxin   PT End of Session - 10/01/21 1303     Visit Number 2    Number of Visits 24    Date for PT Re-Evaluation 12/18/21    PT Start Time 1301    PT Stop Time  1346   PT Time Calculation (min) 44 min   Equipment Utilized During Treatment Gait belt    Activity Tolerance Patient tolerated treatment well    Behavior During Therapy [facility] for tasks assessed/performed             Past Medical History:  Diagnosis Date   Cataract    Hyperlipidemia    Past Surgical History:  Procedure Laterality Date   APPENDECTOMY  1971   CATARACT EXTRACTION W/PHACO Right 09/08/2017   Procedure: CATARACT EXTRACTION PHACO AND INTRAOCULAR LENS PLACEMENT ([place])  RIGHT;  Surgeon: Eulogio Bear, MD;  Location: [place];  Service: Ophthalmology;  Laterality: Right;   CATARACT EXTRACTION W/PHACO Left 09/29/2017   Procedure: CATARACT EXTRACTION PHACO AND INTRAOCULAR LENS PLACEMENT ([place]) LEFT;  Surgeon: Eulogio Bear, MD;  Location: [place];  Service: Ophthalmology;  Laterality: Left;   COLONOSCOPY WITH PROPOFOL N/A 10/20/2017   Procedure: COLONOSCOPY WITH PROPOFOL;  Surgeon: Lucilla Lame, MD;  Location: [place];  Service: Endoscopy;  Laterality: N/A;   Patient Active Problem List   Diagnosis Date Noted   Hepatic cyst 06/20/2021   Elevated alkaline phosphatase level 06/20/2021   Annual physical exam 02/06/2021   Onychomycosis 02/06/2021   Dizziness 12/04/2020   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   Herpes zoster with ophthalmic complication 69/62/9528   Acute actinic otitis externa, left ear 06/30/2020   Facial cellulitis 06/30/2020   Hyperlipidemia    GERD (gastroesophageal reflux disease) 06/26/2020   Hyperlipidemia, mixed 10/01/2017    Chronic left shoulder pain 05/23/2017    ONSET DATE: 06/29/2020  REFERRING DIAG: R42 (ICD-10-CM) - Dizziness  THERAPY DIAG:  Dizziness and giddiness  Unsteadiness on feet  Rationale for Evaluation and Treatment Rehabilitation  SUBJECTIVE:   PERTINENT HISTORY:  Pt states that on Jun 29, 2020 she went to the ER and was found to have Ramsay Hunt 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 Pt states that her face is better. Pt states I have done everything they have told me to do. Pt states she was seen at [place] for her walking and balance issues and that this did help. Pt states she could not drive in the beginning. Pt states after PT she felt more secure and was able to drive about 4 or 5 months after onset. Pt reports she is now driving without issues. Patient states she has been to see a heart, liver, eye doctor. She was referred from the cardiologist to see Dr/ Richardson Landry. ENT physician. She saw Dr. Richardson Landry yesterday. Pt states they found some hearing loss in the left ear and states the tests for crystals was negative at the ENT office. Pt states she has an appointment with Dr. Melrose Nakayama tomorrow. Pt states she tried Clonopin, but that made her sleepy and did not help with her symptoms.  Pt states she wants to get better and have less dizziness. Pt states she is having dizziness daily. Pt states when she gets up she feels fine but states when she gets up and  starts walking and moving she gets dizzy. Pt states she touches the wall for balance. Pt states that it is more difficult to walk in the grocery store and states she needs to hold the grocery cart. Patient states that she is not able to go on a walk outside for exercise with her husband unless she were to hold his hand for balance. Pt reports taht she has had 2 MRI brain scans in the past year and that her cranial nerve#7 is still swollen. Pt states her left inner ear still feels swollen.  Per cardiologist's visit note: She was diagnosed with  Ramsay Hunt syndrome May 2022/about 1 year ago.  Patient had facial palsy, and symptoms of dizziness since then.  Left facial palsy has since improved, symptoms of dizziness have persisted.  Dizziness usually or course when she bends over, or while walking.  Denies palpitations, chest pain, shortness of breath, any history of heart disease. Dizziness, orthostatic vitals in the office with no evidence for orthostasis.  Symptoms of dizziness occurred after Ramsay Hunt's/herpes zoster infection.  Symptoms are not consistent with a cardiac etiology of dizziness.     PAIN:  Are you having pain? No  PRECAUTIONS: Fall  WEIGHT BEARING RESTRICTIONS No  PLOF: Independent, Independent with community mobility without device, Vocation/Vocational requirements: self-employed, and Leisure: travel, swim  PATIENT GOALS Pt would like to be able to walk better without imbalance, to be able to shop in grocery store without symptoms, be able to swim  SUBJECTIVE STATEMENT: Pt states that she got approved for a follow-up MRI of the head but she states it has not been scheduled yet. Patient states she tried doing the VOR X 1 at home and at first she had double vision and then blurry vision.  TODAY'S TREATMENT:  10/01/2021:  Gaze Adaptation:   x1 Viewing Horizontal: Position: sitting, Time: 1 minute, Reps: 2, and Comment: verbal cues for technique Patient reports that she noticed that her neck was making noise without pain when doing exercise at home. Demonstrated and instructed as to chin tuck technique. Pt able to use chin tuck technique.   Airex pad:  On firm surface and then on Airex pad, patient performed feet together progressions (progressed to feet about 1" apart) and semi-tandem progressions with alternating lead leg with and without horizontal and vertical head turns with CGA.  Patient with mild imbalance noted.  Patient challenged by semi-tandem stance with head turning activity. Added these exercises to  HEP.   Ambulation with head turns:  Patient performed 175' forwards ambulation while scanning for targets in hallway with CGA.  Patient reports mild dizziness and imbalance with ambulation with head turns.   Body Wall Rolls:  Patient performed 5 reps of supported, body wall rolls with eyes open with CGA.   Patient reports mild dizziness with this activity.  Added this exercise to home exercise program with handout provided.   PATIENT EDUCATION: Education details: Discussed plan of care and goals; issued vestibular handout slides 1,3, 4, 5 for HEP. Added body wall rolls to HEP.  Person educated: Patient Education method: Explanation, Demonstration, Verbal cues, and Handouts Education comprehension: verbalized understanding and returned demonstration   GOALS: Goals reviewed with patient? Yes  SHORT TERM GOALS: Target date: 11/12/2021   Pt will be independent with HEP in order to improve balance and decrease dizziness symptoms in order to decrease fall risk and improve function at home and work. Baseline: Goal status: INITIAL  2.  Pt will self-report that she  has been able to resume prior activities such as going for a walk outside and being able to shop at the grocery store without imbalance. Baseline: pt states she has to hold her husband's hand to be able to walk outside in the woods and reports she used a cart and had difficulty shopping recently due to her symptoms;  Goal status: INITIAL  LONG TERM GOALS: Target date: 12/24/2021    Patient will have improved FOTO score of 6 points or greater in order to demonstrate improvements in patient's ADLs and functional performance.  Baseline: 45 on 09/25/21; Goal status: INITIAL  2.  Patient will demonstrate reduced falls risk as evidenced by Dynamic Gait Index (DGI) >19/24. Baseline: 10/24 on 09/25/21; Goal status: INITIAL  3.  Patient will reduce falls risk as indicated by Activities Specific Balance Confidence Scale (ABC) >67%.       Patient will have demonstrate decreased falls risk as indicated by Activities Specific Balance Confidence Scale score of 80% or greater. Baseline: 45.6% on 09/25/21; Goal status: INITIAL  4.  Patient will reduce perceived disability to low levels as indicated by <40 on Dizziness Handicap Inventory. Baseline: 42/100 on 09/25/21;  Goal status: INITIAL  5.  Patient will report 50% or greater improvement in their symptoms of dizziness and imbalance with provoking motions.  Baseline: at eval, reports walking with head turns and bending over increase symptoms;  Goal status: INITIAL  ASSESSMENT:  CLINICAL IMPRESSION: Patient reports that she has been trying the VOR exercise and states that at first she was seeing double of the target and had blurring. Reviewed VOR X 1 and added chin tuck technique and progressed to standing. Patient challenged by activities on Airex pad and by ambulation while scanning for targets. Added body wall rolls to home exercise program as this activity reproduced dizziness symptoms. Pt will benefit from continued PT services to try to decrease symptoms of imbalance and address goals.   OBJECTIVE IMPAIRMENTS decreased balance, difficulty walking, and dizziness.   ACTIVITY LIMITATIONS bending, stairs, and locomotion level  PARTICIPATION LIMITATIONS: cleaning, shopping, and walking outside  PERSONAL FACTORS Time since onset of injury/illness/exacerbation and 1-2 comorbidities: Ramsay Hunt syndrome, hyperlipidemia  are also affecting patient's functional outcome.   REHAB POTENTIAL: Good  CLINICAL DECISION MAKING: Evolving/moderate complexity  EVALUATION COMPLEXITY: Moderate   PLAN: PT FREQUENCY: 1-2x/week  PT DURATION: 12 weeks  PLANNED INTERVENTIONS: Therapeutic exercises, Therapeutic activity, Neuromuscular re-education, Balance training, Gait training, Patient/Family education, Self Care, Stair training, Vestibular training, and Canalith repositioning  PLAN FOR  NEXT SESSION: work on progressions of vestibular and balance exercises.    Lady Deutscher, PT 10/01/2021, 1:03 PM

## 2021-10-03 ENCOUNTER — Other Ambulatory Visit: Payer: Self-pay | Admitting: Neurology

## 2021-10-03 DIAGNOSIS — R42 Dizziness and giddiness: Secondary | ICD-10-CM

## 2021-10-03 DIAGNOSIS — R29810 Facial weakness: Secondary | ICD-10-CM

## 2021-10-03 DIAGNOSIS — G510 Bell's palsy: Secondary | ICD-10-CM

## 2021-10-03 DIAGNOSIS — B0221 Postherpetic geniculate ganglionitis: Secondary | ICD-10-CM

## 2021-10-05 ENCOUNTER — Telehealth: Payer: Self-pay | Admitting: Internal Medicine

## 2021-10-05 DIAGNOSIS — Z1231 Encounter for screening mammogram for malignant neoplasm of breast: Secondary | ICD-10-CM

## 2021-10-05 NOTE — Telephone Encounter (Signed)
Copied from [place] [id number]. Topic: General - Other >> Oct 05, 2021 11:09 AM Tiffany B wrote: Patient would like annual mamo orders sent to imaging. Patient states please reference her chart regarding past mammogram locations and send orders there.

## 2021-10-05 NOTE — Telephone Encounter (Signed)
Patient would like to have orders sent to [facility] for her Mammo. Order is tee' up for your signature

## 2021-10-09 ENCOUNTER — Encounter: Payer: Self-pay | Admitting: Physical Therapy

## 2021-10-09 ENCOUNTER — Ambulatory Visit: Payer: No Typology Code available for payment source | Admitting: Physical Therapy

## 2021-10-09 DIAGNOSIS — R42 Dizziness and giddiness: Secondary | ICD-10-CM

## 2021-10-09 DIAGNOSIS — R2681 Unsteadiness on feet: Secondary | ICD-10-CM

## 2021-10-09 NOTE — Therapy (Signed)
OUTPATIENT PHYSICAL THERAPY VESTIBULAR EVALUATION     Patient Name: Abigail Delacruz MRN: [id number] DOB:1950/03/17, 71 y.o., female Today's Date: 10/09/2021  PCP: Dr. Charlynne Cousins REFERRING PROVIDER: Dr. Gurney Maxin   PT End of Session - 10/09/21 0920     Visit Number 3    Number of Visits 24    Date for PT Re-Evaluation 12/18/21    PT Start Time 0920    PT Stop Time 1004    PT Time Calculation (min) 44 min    Equipment Utilized During Treatment Gait belt    Activity Tolerance Patient tolerated treatment well    Behavior During Therapy [facility] for tasks assessed/performed             Past Medical History:  Diagnosis Date   Cataract    Hyperlipidemia    Past Surgical History:  Procedure Laterality Date   APPENDECTOMY  1971   CATARACT EXTRACTION W/PHACO Right 09/08/2017   Procedure: CATARACT EXTRACTION PHACO AND INTRAOCULAR LENS PLACEMENT ([place])  RIGHT;  Surgeon: Eulogio Bear, MD;  Location: [place];  Service: Ophthalmology;  Laterality: Right;   CATARACT EXTRACTION W/PHACO Left 09/29/2017   Procedure: CATARACT EXTRACTION PHACO AND INTRAOCULAR LENS PLACEMENT ([place]) LEFT;  Surgeon: Eulogio Bear, MD;  Location: [place];  Service: Ophthalmology;  Laterality: Left;   COLONOSCOPY WITH PROPOFOL N/A 10/20/2017   Procedure: COLONOSCOPY WITH PROPOFOL;  Surgeon: Lucilla Lame, MD;  Location: [place];  Service: Endoscopy;  Laterality: N/A;   Patient Active Problem List   Diagnosis Date Noted   Hepatic cyst 06/20/2021   Elevated alkaline phosphatase level 06/20/2021   Annual physical exam 02/06/2021   Onychomycosis 02/06/2021   Dizziness 12/04/2020   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   Herpes zoster with ophthalmic complication 88/82/8003   Acute actinic otitis externa, left ear 06/30/2020   Facial cellulitis 06/30/2020   Hyperlipidemia    GERD (gastroesophageal reflux disease) 06/26/2020   Hyperlipidemia, mixed 10/01/2017    Chronic left shoulder pain 05/23/2017    ONSET DATE: 06/29/2020  REFERRING DIAG: R42 (ICD-10-CM) - Dizziness  THERAPY DIAG:  Dizziness and giddiness  Unsteadiness on feet  Rationale for Evaluation and Treatment Rehabilitation  SUBJECTIVE:   PERTINENT HISTORY:  Pt states that on Jun 29, 2020 she went to the ER and was found to have Ramsay Hunt 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 Pt states that her face is better. Pt states I have done everything they have told me to do. Pt states she was seen at [place] for her walking and balance issues and that this did help. Pt states she could not drive in the beginning. Pt states after PT she felt more secure and was able to drive about 4 or 5 months after onset. Pt reports she is now driving without issues. Patient states she has been to see a heart, liver, eye doctor. She was referred from the cardiologist to see Dr/ Richardson Landry. ENT physician. She saw Dr. Richardson Landry yesterday. Pt states they found some hearing loss in the left ear and states the tests for crystals was negative at the ENT office. Pt states she has an appointment with Dr. Melrose Nakayama tomorrow. Pt states she tried Clonopin, but that made her sleepy and did not help with her symptoms.  Pt states she wants to get better and have less dizziness. Pt states she is having dizziness daily. Pt states when she gets up she feels fine but states when she gets up  and starts walking and moving she gets dizzy. Pt states she touches the wall for balance. Pt states that it is more difficult to walk in the grocery store and states she needs to hold the grocery cart. Patient states that she is not able to go on a walk outside for exercise with her husband unless she were to hold his hand for balance. Pt reports taht she has had 2 MRI brain scans in the past year and that her cranial nerve#7 is still swollen. Pt states her left inner ear still feels swollen.  Per cardiologist's visit note: She was diagnosed with  Ramsay Hunt syndrome May 2022/about 1 year ago.  Patient had facial palsy, and symptoms of dizziness since then.  Left facial palsy has since improved, symptoms of dizziness have persisted.  Dizziness usually or course when she bends over, or while walking.  Denies palpitations, chest pain, shortness of breath, any history of heart disease. Dizziness, orthostatic vitals in the office with no evidence for orthostasis.  Symptoms of dizziness occurred after Ramsay Hunt's/herpes zoster infection.  Symptoms are not consistent with a cardiac etiology of dizziness.     PAIN:  Are you having pain? No  PRECAUTIONS: Fall  WEIGHT BEARING RESTRICTIONS No  PLOF: Independent, Independent with community mobility without device, Vocation/Vocational requirements: self-employed, and Leisure: travel, swim  PATIENT GOALS Pt would like to be able to walk better without imbalance, to be able to shop in grocery store without symptoms, be able to swim  SUBJECTIVE STATEMENT: Pt states she felt dizzy and tired on Sunday. She states she is having her MRI tomorrow morning. Pt states yesterday she felt really good and did not have much dizziness. Pt states today she is having mild dizziness today. Pt states she is doing well with her home exercise program. Pt states she is no longer getting double vision with the VOR X 1 exercise.   TODAY'S TREATMENT:  10/09/2021: Diona Foley toss over shoulder: Patient performed multiple 75 foot trials forward ambulation while tossing ball over one shoulder with return catch over opposite shoulder with contact-guard assist.  Then patient performed retroambulation with ball toss over shoulder guard assist.  Noted patient had more difficulty with this activity with decreased speed and moments of pausing for balance. Patient performed multiple 59' trials of forward ambulation while tossing ball over one shoulder with return catch over opposite shoulder varying the ball position to head, shoulder and  waist level to promote head turning and tilting.  This activity was also challenging for patient's balance but patient is able to perform with contact-guard assist. Patient reports mild dizziness with retroambulation and with forward ambulation with ball toss at varying levels.  Airex pad: On Airex pad, patient performed feet together and semitandem progressions with twisting at waist and looking over shoulder with contact-guard assist multiple reps. On Airex pad, patient performed with feet together eyes closed head stationary static stance holds for 10 to 30 seconds multiple repetitions.  Then, added eyes closed with horizontal and then vertical head turns multiple repetitions of 15 to 30 seconds. Patient required contact-guard to min assist at times secondary to loss of balance with above activities. Patient reports mild dizziness with above activities.  Airex balance beam: On Airex balance beam, performed sideways stepping with horizontal head turns 5' times 4 reps. On Airex balance beam, performed sideways stepping with vertical head turns 5' times 4 reps. Patient reports that above activities caused mild dizziness.  Patient with moderate imbalance with above activities  and was able to perform with assistance ranging from contact-guard to min assist.  Body Wall Rolls:  Patient performed 6 reps of supported, body wall rolls with eyes closed with contact-guard assistance. Patient reports mild dizziness with this activity.  Noted patient more reliant on wall with eyes closed as compared to eyes open.  Ambulation with head turns: Patient performed forward ambulation 75 feet x 2 repetitions with diagonal head turns with contact-guard assistance. Patient reports increased dizziness with this activity and noted patient paused after each turn before completing next repetition.   10/01/2021:  Gaze Adaptation:  x1 Viewing Horizontal: Position: sitting, Time: 1 minute, Reps: 2, and Comment: verbal  cues for technique Chin tuck technique Airex feet about 1" apart and ST with horiz and vert head turns  Ambulation with head turns:  Patient performed 175' forwards and retro ambulation while scanning for targets in hallway.  Patient performed 175' trials of forwards and retro ambulation with horizontal and vertical head turns with CGA.   Patient demonstrates mild veering at times with retro ambulation with vertical head turns more so than horizontal head turns.  Patient reports mild dizziness and imbalance with ambulation with vertical head turns.   Body Wall Rolls:  Patient performed 5 reps of supported, body wall rolls with eyes open.  Patient reports mild dizziness with this activity.  Added this exercise to home exercise program with handout provided.   Non-compliant/ Firm Surface: On firm surface, patient performed feet together progressions and semi-tandem progressions with alternating lead leg with and without horizontal and vertical head turns with CGA.  Patient challenged by semi-tandem stance with head turning activity. Added these exercises to HEP.   PATIENT EDUCATION: Education details: Discussed home exercise program: Did body turns to feet together and semitandem progressions vestibular handout slides 4, 5 for HEP.  Person educated: Patient Education method: Explanation, Demonstration, Verbal cues, and Handouts Education comprehension: verbalized understanding and returned demonstration   GOALS: Goals reviewed with patient? Yes  SHORT TERM GOALS: Target date: 11/20/2021   Pt will be independent with HEP in order to improve balance and decrease dizziness symptoms in order to decrease fall risk and improve function at home and work. Baseline: Goal status: INITIAL  2.  Pt will self-report that she has been able to resume prior activities such as going for a walk outside and being able to shop at the grocery store without imbalance. Baseline: pt states she has to hold  her husband's hand to be able to walk outside in the woods and reports she used a cart and had difficulty shopping recently due to her symptoms;  Goal status: INITIAL  LONG TERM GOALS: Target date: 01/01/2022    Patient will have improved FOTO score of 6 points or greater in order to demonstrate improvements in patient's ADLs and functional performance.  Baseline: 45 on 09/25/21; Goal status: INITIAL  2.  Patient will demonstrate reduced falls risk as evidenced by Dynamic Gait Index (DGI) >19/24. Baseline: 10/24 on 09/25/21; Goal status: INITIAL  3.  Patient will reduce falls risk as indicated by Activities Specific Balance Confidence Scale (ABC) >67%.      Patient will have demonstrate decreased falls risk as indicated by Activities Specific Balance Confidence Scale score of 80% or greater. Baseline: 45.6% on 09/25/21; Goal status: INITIAL  4.  Patient will reduce perceived disability to low levels as indicated by <40 on Dizziness Handicap Inventory. Baseline: 42/100 on 09/25/21;  Goal status: INITIAL  5.  Patient will report  50% or greater improvement in their symptoms of dizziness and imbalance with provoking motions.  Baseline: at eval, reports walking with head turns and bending over increase symptoms;  Goal status: INITIAL   ASSESSMENT:  CLINICAL IMPRESSION: Patient reports that she is now able to perform VOR x1 without getting any double vision and reports good compliance with home exercise program, and able to have progression of body twist looking over shoulder with feet together and semitandem progressions for home program this date.  Patient was challenged by diagonal head turn activities and eyes closed activities this date.  Patient also challenged by activities on Airex balance beam but improved with practice.  Patient motivated to session. Pt will benefit from skilled vestibular PT services to address functional deficits and goals in order to try to address functional deficits and  to try to decrease patient's subjective symptoms of dizziness and imbalance.  OBJECTIVE IMPAIRMENTS decreased balance, difficulty walking, and dizziness.   ACTIVITY LIMITATIONS bending, stairs, and locomotion level  PARTICIPATION LIMITATIONS: cleaning, shopping, and walking outside  PERSONAL FACTORS Time since onset of injury/illness/exacerbation and 1-2 comorbidities: Ramsay Hunt syndrome, hyperlipidemia  are also affecting patient's functional outcome.   REHAB POTENTIAL: Good  CLINICAL DECISION MAKING: Evolving/moderate complexity  EVALUATION COMPLEXITY: Moderate   PLAN: PT FREQUENCY: 1-2x/week  PT DURATION: 12 weeks  PLANNED INTERVENTIONS: Therapeutic exercises, Therapeutic activity, Neuromuscular re-education, Balance training, Gait training, Patient/Family education, Self Care, Stair training, Vestibular training, and Canalith repositioning  PLAN FOR NEXT SESSION: Forward and retroambulation with diagonal self-selected head turns.  Walking VOR x1.  Ambulation with quick turns.  Lady Deutscher, PT 10/09/2021, 10:35 AM

## 2021-10-10 ENCOUNTER — Ambulatory Visit
Admission: RE | Admit: 2021-10-10 | Discharge: 2021-10-10 | Disposition: A | Discharge status: Home / Self Care | Payer: No Typology Code available for payment source | Source: Ambulatory Visit | Attending: Neurology | Admitting: Neurology

## 2021-10-10 DIAGNOSIS — R29810 Facial weakness: Secondary | ICD-10-CM | POA: Insufficient documentation

## 2021-10-10 DIAGNOSIS — B0221 Postherpetic geniculate ganglionitis: Secondary | ICD-10-CM | POA: Diagnosis not present

## 2021-10-10 DIAGNOSIS — G510 Bell's palsy: Secondary | ICD-10-CM | POA: Insufficient documentation

## 2021-10-10 DIAGNOSIS — R42 Dizziness and giddiness: Secondary | ICD-10-CM | POA: Diagnosis not present

## 2021-10-10 MED ORDER — GADOBUTROL 1 MMOL/ML IV SOLN
9.0000 mL | Freq: Once | INTRAVENOUS | Status: AC | PRN
  Administered 2021-10-10: 9 mL via INTRAVENOUS

## 2021-10-16 ENCOUNTER — Ambulatory Visit: Payer: No Typology Code available for payment source | Admitting: Physical Therapy

## 2021-10-16 DIAGNOSIS — R42 Dizziness and giddiness: Secondary | ICD-10-CM | POA: Diagnosis not present

## 2021-10-16 DIAGNOSIS — R2681 Unsteadiness on feet: Secondary | ICD-10-CM

## 2021-10-16 NOTE — Therapy (Signed)
OUTPATIENT PHYSICAL THERAPY VESTIBULAR EVALUATION     Patient Name: Abigail Delacruz MRN: [id number] DOB:29-Dec-1950, 71 y.o., female Today's Date: 10/16/2021  PCP: Dr. Charlynne Cousins REFERRING PROVIDER: Dr. Gurney Maxin   PT End of Session - 10/16/21 1318     Visit Number 4    Number of Visits 24    Date for PT Re-Evaluation 12/18/21    PT Start Time 1316    Equipment Utilized During Treatment Gait belt    Activity Tolerance Patient tolerated treatment well    Behavior During Therapy [facility] for tasks assessed/performed             Past Medical History:  Diagnosis Date   Cataract    Hyperlipidemia    Past Surgical History:  Procedure Laterality Date   APPENDECTOMY  1971   CATARACT EXTRACTION W/PHACO Right 09/08/2017   Procedure: CATARACT EXTRACTION PHACO AND INTRAOCULAR LENS PLACEMENT ([place])  RIGHT;  Surgeon: Eulogio Bear, MD;  Location: [place];  Service: Ophthalmology;  Laterality: Right;   CATARACT EXTRACTION W/PHACO Left 09/29/2017   Procedure: CATARACT EXTRACTION PHACO AND INTRAOCULAR LENS PLACEMENT ([place]) LEFT;  Surgeon: Eulogio Bear, MD;  Location: [place];  Service: Ophthalmology;  Laterality: Left;   COLONOSCOPY WITH PROPOFOL N/A 10/20/2017   Procedure: COLONOSCOPY WITH PROPOFOL;  Surgeon: Lucilla Lame, MD;  Location: [place];  Service: Endoscopy;  Laterality: N/A;   Patient Active Problem List   Diagnosis Date Noted   Hepatic cyst 06/20/2021   Elevated alkaline phosphatase level 06/20/2021   Annual physical exam 02/06/2021   Onychomycosis 02/06/2021   Dizziness 12/04/2020   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   Herpes zoster with ophthalmic complication 76/73/4193   Acute actinic otitis externa, left ear 06/30/2020   Facial cellulitis 06/30/2020   Hyperlipidemia    GERD (gastroesophageal reflux disease) 06/26/2020   Hyperlipidemia, mixed 10/01/2017   Chronic left shoulder pain 05/23/2017    ONSET DATE:  06/29/2020  REFERRING DIAG: R42 (ICD-10-CM) - Dizziness  THERAPY DIAG:  Dizziness and giddiness  Unsteadiness on feet  Rationale for Evaluation and Treatment Rehabilitation  SUBJECTIVE:   PERTINENT HISTORY:  Pt states that on Jun 29, 2020 she went to the ER and was found to have Ramsay Hunt 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 Pt states that her face is better. Pt states I have done everything they have told me to do. Pt states she was seen at [place] for her walking and balance issues and that this did help. Pt states she could not drive in the beginning. Pt states after PT she felt more secure and was able to drive about 4 or 5 months after onset. Pt reports she is now driving without issues. Patient states she has been to see a heart, liver, eye doctor. She was referred from the cardiologist to see Dr/ Richardson Landry. ENT physician. She saw Dr. Richardson Landry yesterday. Pt states they found some hearing loss in the left ear and states the tests for crystals was negative at the ENT office. Pt states she has an appointment with Dr. Melrose Nakayama tomorrow. Pt states she tried Clonopin, but that made her sleepy and did not help with her symptoms.  Pt states she wants to get better and have less dizziness. Pt states she is having dizziness daily. Pt states when she gets up she feels fine but states when she gets up and starts walking and moving she gets dizzy. Pt states she touches the wall for balance.  Pt states that it is more difficult to walk in the grocery store and states she needs to hold the grocery cart. Patient states that she is not able to go on a walk outside for exercise with her husband unless she were to hold his hand for balance. Pt reports taht she has had 2 MRI brain scans in the past year and that her cranial nerve#7 is still swollen. Pt states her left inner ear still feels swollen.  Per cardiologist's visit note: She was diagnosed with Ramsay Hunt syndrome May 2022/about 1 year ago.   Patient had facial palsy, and symptoms of dizziness since then.  Left facial palsy has since improved, symptoms of dizziness have persisted.  Dizziness usually or course when she bends over, or while walking.  Denies palpitations, chest pain, shortness of breath, any history of heart disease. Dizziness, orthostatic vitals in the office with no evidence for orthostasis.  Symptoms of dizziness occurred after Ramsay Hunt's/herpes zoster infection.  Symptoms are not consistent with a cardiac etiology of dizziness.     PAIN:  Are you having pain? No  PRECAUTIONS: Fall  WEIGHT BEARING RESTRICTIONS No  PLOF: Independent, Independent with community mobility without device, Vocation/Vocational requirements: self-employed, and Leisure: travel, swim  PATIENT GOALS Pt would like to be able to walk better without imbalance, to be able to shop in grocery store without symptoms, be able to swim  SUBJECTIVE STATEMENT: Patient states she went to [place] for the weekend. Pt states she did not have any bad episodes of dizziness on the trip but that she did have some very mild dizziness.  Patient reports that she is leaving to go to [facility]. on Friday.    TODAY'S TREATMENT:  10/16/2021:  VOR X 1 exercise:  Patient performed VOR X 1 horizontal in standing with conflicting background 3 reps of 1 minute each with verbal cues for technique to increase speed of head turn without the target getting blurry or jumpy.  Patient reports mild dizziness with this activity.  Added this progression to home exercise program.  Quick Turns:  Patient performed 7 reps walking 8' with alternating quick turns left and right with contact-guard assistance.  Patient noted to have moderate imbalance with quick turning and needed to touch the wall over 80% of time. Added this progression for home program.  Ambulation with head turns:   Patient performed 175' trials of forwards and retro ambulation with diagonal head  turns with contact-guard assistance. Then worked on forward ambulation with diagonal head turns with alternating quick turns as cued by therapist.  Patient noted to have a few episodes of mild veering/unsteadiness at times with this activity.   8/15/2023Diona Foley toss over shoulder: Patient performed multiple 75 foot trials forward ambulation while tossing ball over one shoulder with return catch over opposite shoulder with contact-guard assist.  Then patient performed retroambulation with ball toss over shoulder guard assist.  Noted patient had more difficulty with this activity with decreased speed and moments of pausing for balance. Patient performed multiple 33' trials of forward ambulation while tossing ball over one shoulder with return catch over opposite shoulder varying the ball position to head, shoulder and waist level to promote head turning and tilting.  This activity was also challenging for patient's balance but patient is able to perform with contact-guard assist. Patient reports mild dizziness with retroambulation and with forward ambulation with ball toss at varying levels.  Airex pad: On Airex pad, patient performed feet together and  semitandem progressions with twisting at waist and looking over shoulder with contact-guard assist multiple reps. On Airex pad, patient performed with feet together eyes closed head stationary static stance holds for 10 to 30 seconds multiple repetitions.  Then, added eyes closed with horizontal and then vertical head turns multiple repetitions of 15 to 30 seconds. Patient required contact-guard to min assist at times secondary to loss of balance with above activities. Patient reports mild dizziness with above activities.  Airex balance beam: On Airex balance beam, performed sideways stepping with horizontal head turns 5' times 4 reps. On Airex balance beam, performed sideways stepping with vertical head turns 5' times 4 reps. Patient reports that  above activities caused mild dizziness.  Patient with moderate imbalance with above activities and was able to perform with assistance ranging from contact-guard to min assist.  Body Wall Rolls:  Patient performed 6 reps of supported, body wall rolls with eyes closed with contact-guard assistance. Patient reports mild dizziness with this activity.  Noted patient more reliant on wall with eyes closed as compared to eyes open.  Ambulation with head turns: Patient performed forward ambulation 75 feet x 2 repetitions with diagonal head turns with contact-guard assistance. Patient reports increased dizziness with this activity and noted patient paused after each turn before completing next repetition.   10/01/2021:  Gaze Adaptation:  x1 Viewing Horizontal: Position: sitting, Time: 1 minute, Reps: 2, and Comment: verbal cues for technique Chin tuck technique Airex feet about 1" apart and ST with horiz and vert head turns  Ambulation with head turns:  Patient performed 175' forwards and retro ambulation while scanning for targets in hallway.  Patient performed 175' trials of forwards and retro ambulation with horizontal and vertical head turns with CGA.   Patient demonstrates mild veering at times with retro ambulation with vertical head turns more so than horizontal head turns.  Patient reports mild dizziness and imbalance with ambulation with vertical head turns.   Body Wall Rolls:  Patient performed 5 reps of supported, body wall rolls with eyes open.  Patient reports mild dizziness with this activity.  Added this exercise to home exercise program with handout provided.   Non-compliant/ Firm Surface: On firm surface, patient performed feet together progressions and semi-tandem progressions with alternating lead leg with and without horizontal and vertical head turns with CGA.  Patient challenged by semi-tandem stance with head turning activity. Added these exercises to HEP.   PATIENT  EDUCATION: Education details: Added VOR x1 with conflicting background progression to home exercise program ambulation with alternating quick turns standing by wall for safety.  Handout provided Person educated: Patient Education method: Explanation, Demonstration, Verbal cues, and Handouts Education comprehension: verbalized understanding and returned demonstration   GOALS: Goals reviewed with patient? Yes  SHORT TERM GOALS: Target date: 11/27/2021   Pt will be independent with HEP in order to improve balance and decrease dizziness symptoms in order to decrease fall risk and improve function at home and work. Baseline: Goal status: INITIAL  2.  Pt will self-report that she has been able to resume prior activities such as going for a walk outside and being able to shop at the grocery store without imbalance. Baseline: pt states she has to hold her husband's hand to be able to walk outside in the woods and reports she used a cart and had difficulty shopping recently due to her symptoms;  Goal status: INITIAL  LONG TERM GOALS: Target date: 01/08/2022    Patient will have improved FOTO  score of 6 points or greater in order to demonstrate improvements in patient's ADLs and functional performance.  Baseline: 45 on 09/25/21; Goal status: INITIAL  2.  Patient will demonstrate reduced falls risk as evidenced by Dynamic Gait Index (DGI) >19/24. Baseline: 10/24 on 09/25/21; Goal status: INITIAL  3.  Patient will reduce falls risk as indicated by Activities Specific Balance Confidence Scale (ABC) >67%.      Patient will have demonstrate decreased falls risk as indicated by Activities Specific Balance Confidence Scale score of 80% or greater. Baseline: 45.6% on 09/25/21; Goal status: INITIAL  4.  Patient will reduce perceived disability to low levels as indicated by <40 on Dizziness Handicap Inventory. Baseline: 42/100 on 09/25/21;  Goal status: INITIAL  5.  Patient will report 50% or greater  improvement in their symptoms of dizziness and imbalance with provoking motions.  Baseline: at eval, reports walking with head turns and bending over increase symptoms;  Goal status: INITIAL   ASSESSMENT:  CLINICAL IMPRESSION: Patient reports that she did well on her trip to [place] and was able to do activities in busy environments without loss of balance but that she had some very mild dizziness at times.  Patient able to progress to VOR x1 with conflicting background this date.  Patient was challenged by all activities involving quick turn subsequent sessions as well as added ambulation with alternating quick turns to home exercise program.  She continues to be challenged with forward and retroambulation with diagonal head turn activities.  Patient will benefit from continued vestibular PT services to further address goals and functional deficits.  OBJECTIVE IMPAIRMENTS decreased balance, difficulty walking, and dizziness.   ACTIVITY LIMITATIONS bending, stairs, and locomotion level  PARTICIPATION LIMITATIONS: cleaning, shopping, and walking outside  PERSONAL FACTORS Time since onset of injury/illness/exacerbation and 1-2 comorbidities: Ramsay Hunt syndrome, hyperlipidemia  are also affecting patient's functional outcome.   REHAB POTENTIAL: Good  CLINICAL DECISION MAKING: Evolving/moderate complexity  EVALUATION COMPLEXITY: Moderate   PLAN: PT FREQUENCY: 1-2x/week  PT DURATION: 12 weeks  PLANNED INTERVENTIONS: Therapeutic exercises, Therapeutic activity, Neuromuscular re-education, Balance training, Gait training, Patient/Family education, Self Care, Stair training, Vestibular training, and Canalith repositioning  PLAN FOR NEXT SESSION: Forward and retroambulation with diagonal self-selected head turns.  Walking VOR x1.  Ambulation with quick turns.  Doorway alternating ball toss with head follows.  Lady Deutscher, PT 10/16/2021, 1:19 PM

## 2021-10-22 ENCOUNTER — Encounter: Payer: Self-pay | Admitting: Physician Assistant

## 2021-10-22 ENCOUNTER — Ambulatory Visit (INDEPENDENT_AMBULATORY_CARE_PROVIDER_SITE_OTHER): Payer: No Typology Code available for payment source | Admitting: Physician Assistant

## 2021-10-22 VITALS — BP 119/76 | HR 80 | Temp 98.0°F | Ht 62.01 in | Wt 187.0 lb

## 2021-10-22 DIAGNOSIS — J40 Bronchitis, not specified as acute or chronic: Secondary | ICD-10-CM

## 2021-10-22 DIAGNOSIS — R051 Acute cough: Secondary | ICD-10-CM | POA: Diagnosis not present

## 2021-10-22 MED ORDER — ALBUTEROL SULFATE HFA 108 (90 BASE) MCG/ACT IN AERS: 2.0000 | INHALATION_SPRAY | Freq: Four times a day (QID) | RESPIRATORY_TRACT | 0 refills | Status: DC | PRN

## 2021-10-22 MED ORDER — SPACER/AERO-HOLDING CHAMBERS DEVI: 1.0000 [IU] | 0 refills | Status: DC | PRN

## 2021-10-22 MED ORDER — BENZONATATE 100 MG PO CAPS: 100.0000 mg | ORAL_CAPSULE | Freq: Two times a day (BID) | ORAL | 0 refills | Status: DC | PRN

## 2021-10-22 NOTE — Patient Instructions (Addendum)
Based on your described symptoms and the duration of symptoms it is likely that you have a viral upper respiratory infection (often called a "cold")  Symptoms can last for 3-10 days with lingering cough and intermittent symptoms lasting weeks after that.  The goal of treatment at this time is to reduce your symptoms and discomfort   I have sent in Tessalon pearls for you to take twice per day to help with your cough I have sent in a script for a rescue inhaler and a spacer to help with the coughing  You can use over the counter medications such as Dayquil/Nyquil, AlkaSeltzer formulations, etc to provide further relief of symptoms according to the manufacturer's instructions  I recommend using Mucinex to assist with getting rid of any mucus in your chest and airways   If your symptoms do not improve or become worse in the next 5-7 days please make an apt at the office so we can see you Go to the ER if you begin to have more serious symptoms such as shortness of breath, trouble breathing, loss of consciousness, swelling around the eyes, high fever, severe lasting headaches, vision changes or neck pain/stiffness.

## 2021-10-22 NOTE — Progress Notes (Signed)
Acute Office Visit   Patient: Abigail Delacruz   DOB: 1950/06/03   71 y.o. Female  MRN: [id number] Visit Date: 10/22/2021  Today's healthcare provider: Dani Gobble Harrol Novello, PA-C  Introduced myself to the patient as a Journalist, newspaper and provided education on APPs in clinical practice.    Chief Complaint  Patient presents with   Cough    Past 2 weeks, and having fatigue    Subjective    HPI HPI     Cough    Additional comments: Past 2 weeks, and having fatigue       Last edited by Jerelene Redden, CMA on 10/22/2021  2:32 PM.      Cough  Duration: 2 weeks  Associated Symptoms: fatigue, wheezing, headaches, non-productive cough.  Alleviating: Nothing  Aggravating:Nothing  Interventions: has not taken anything  She has not tested herself for COVID at home   Medications: Outpatient Medications Prior to Visit  Medication Sig   atorvastatin (LIPITOR) 40 MG tablet Take 1 tablet (40 mg total) by mouth daily.   Cholecalciferol (VITAMIN D3) 1.25 MG (50000 UT) TABS Take by mouth.   Multiple Vitamins-Minerals (MULTIVITAMIN ADULT) CHEW Chew by mouth.   omeprazole (PRILOSEC) 20 MG capsule Take 1 capsule (20 mg total) by mouth daily.   CALCIUM-MAGNESIUM-ZINC PO Take 1 tablet by mouth daily. (Patient not taking: Reported on 09/25/2021)   [DISCONTINUED] clonazePAM (KLONOPIN) 0.5 MG tablet Take 0.25 mg by mouth daily. Take 0.25 one time daily for one week then take 0.25 mg two times daily (Patient not taking: Reported on 09/25/2021)   [DISCONTINUED] lactobacillus acidophilus & bulgar (LACTINEX) chewable tablet Chew 1 tablet by mouth 3 (three) times daily with meals. (Patient not taking: Reported on 08/30/2021)   No facility-administered medications prior to visit.    Review of Systems  Constitutional:  Positive for fatigue. Negative for chills and fever.  HENT:  Positive for ear pain (right ear pain) and postnasal drip. Negative for congestion, rhinorrhea, sinus pressure, sinus pain and sore  throat.   Respiratory:  Positive for cough and wheezing. Negative for shortness of breath.   Gastrointestinal:  Negative for diarrhea, nausea and vomiting.  Musculoskeletal:  Negative for myalgias.  Neurological:  Positive for headaches.       Objective    BP 119/76   Pulse 80   Temp 98 F (36.7 C) (Oral)   Ht 5' 2.01" (1.575 m)   Wt 187 lb (84.8 kg)   SpO2 97%   BMI 34.19 kg/m    Physical Exam Vitals reviewed.  Constitutional:      General: She is awake.     Appearance: Normal appearance. She is well-developed and well-groomed. She is obese.  HENT:     Head: Normocephalic and atraumatic.     Mouth/Throat:     Lips: Pink.     Mouth: Mucous membranes are moist.     Pharynx: Oropharynx is clear. Uvula midline. No oropharyngeal exudate or posterior oropharyngeal erythema.  Cardiovascular:     Rate and Rhythm: Normal rate and regular rhythm.     Pulses: Normal pulses.     Heart sounds: Normal heart sounds. No murmur heard.    No friction rub. No gallop.  Pulmonary:     Effort: Pulmonary effort is normal.     Breath sounds: Normal breath sounds. No decreased air movement. No decreased breath sounds, wheezing, rhonchi or rales.  Musculoskeletal:     Right lower leg: No  edema.     Left lower leg: No edema.  Neurological:     Mental Status: She is alert.  Psychiatric:        Attention and Perception: Attention and perception normal.        Mood and Affect: Mood and affect normal.        Speech: Speech normal.        Behavior: Behavior normal. Behavior is cooperative.       No results found for any visits on 10/22/21.  Assessment & Plan      Return in about 2 months (around 12/22/2021) for fatigue and memory concerns.     Problem List Items Addressed This Visit   None Visit Diagnoses     Acute cough    -  Primary Acute, new concern Reports nonproductive cough with some fatigue and headaches for about 2 weeks  Suspect viral etiology at this time Will provide  Tessalon pearls for cough suppression and recommend she use OTC medications as needed for further symptoms management Follow up as needed     Relevant Medications   benzonatate (TESSALON) 100 MG capsule   Bronchitis     Acute, new concern Reports nonproductive cough for about 2 weeks PE was overall reassuring Suspect viral illness at this time.  Will provide Albuterol with spacer to assist with bronchial irritation and tessalon pearls to assist with cough Recommend she use OTC multi-symptom relief medications per preference to assist with further symptoms If not improving or worsening over the next 5-7 days recommend she return to office for CXR and potential abx as indicated. Follow up as needed    Relevant Medications   benzonatate (TESSALON) 100 MG capsule   albuterol (VENTOLIN HFA) 108 (90 Base) MCG/ACT inhaler   Spacer/Aero-Holding Josiah Lobo DEVI        Return in about 2 months (around 12/22/2021) for fatigue and memory concerns.   I, Pal Shell E Aksel Bencomo, PA-C, have reviewed all documentation for this visit. The documentation on 10/22/21 for the exam, diagnosis, procedures, and orders are all accurate and complete.   Talitha Givens, MHS, PA-C [facility]

## 2021-10-23 ENCOUNTER — Encounter: Payer: Self-pay | Admitting: Physical Therapy

## 2021-10-23 ENCOUNTER — Ambulatory Visit: Payer: No Typology Code available for payment source | Admitting: Physical Therapy

## 2021-10-23 DIAGNOSIS — R2681 Unsteadiness on feet: Secondary | ICD-10-CM

## 2021-10-23 DIAGNOSIS — R42 Dizziness and giddiness: Secondary | ICD-10-CM

## 2021-10-23 NOTE — Therapy (Signed)
OUTPATIENT PHYSICAL THERAPY VESTIBULAR TREATMENT NOTE     Patient Name: Abigail Delacruz MRN: [id number] DOB:03-Oct-1950, 71 y.o., female Today's Date: 10/23/2021  PCP: Dr. Charlynne Cousins REFERRING PROVIDER: Dr. Gurney Maxin   PT End of Session - 10/23/21 0917     Visit Number 5    Number of Visits 24    Date for PT Re-Evaluation 12/18/21    PT Start Time 0917   Pt arrived late for appt therefore shorter treatment session this date.   PT Stop Time 0952    PT Time Calculation (min) 35 min    Equipment Utilized During Treatment Gait belt    Activity Tolerance Patient tolerated treatment well    Behavior During Therapy WFL for tasks assessed/performed             Past Medical History:  Diagnosis Date   Cataract    Hyperlipidemia    Past Surgical History:  Procedure Laterality Date   APPENDECTOMY  1971   CATARACT EXTRACTION W/PHACO Right 09/08/2017   Procedure: CATARACT EXTRACTION PHACO AND INTRAOCULAR LENS PLACEMENT ([place])  RIGHT;  Surgeon: Eulogio Bear, MD;  Location: [facility];  Service: Ophthalmology;  Laterality: Right;   CATARACT EXTRACTION W/PHACO Left 09/29/2017   Procedure: CATARACT EXTRACTION PHACO AND INTRAOCULAR LENS PLACEMENT ([place]) LEFT;  Surgeon: Eulogio Bear, MD;  Location: [facility];  Service: Ophthalmology;  Laterality: Left;   COLONOSCOPY WITH PROPOFOL N/A 10/20/2017   Procedure: COLONOSCOPY WITH PROPOFOL;  Surgeon: Lucilla Lame, MD;  Location: [facility];  Service: Endoscopy;  Laterality: N/A;   Patient Active Problem List   Diagnosis Date Noted   Hepatic cyst 06/20/2021   Elevated alkaline phosphatase level 06/20/2021   Annual physical exam 02/06/2021   Onychomycosis 02/06/2021   Dizziness 12/04/2020   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   Herpes zoster with ophthalmic complication 17/00/1749   Acute actinic otitis externa, left ear 06/30/2020   Facial cellulitis 06/30/2020   Hyperlipidemia    GERD  (gastroesophageal reflux disease) 06/26/2020   Hyperlipidemia, mixed 10/01/2017   Chronic left shoulder pain 05/23/2017    ONSET DATE: 06/29/2020  REFERRING DIAG: R42 (ICD-10-CM) - Dizziness  THERAPY DIAG:  Dizziness and giddiness  Unsteadiness on feet  Rationale for Evaluation and Treatment Rehabilitation  SUBJECTIVE:   PERTINENT HISTORY:  Pt states that on Jun 29, 2020 she went to the ER and was found to have Ramsay Hunt 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 Pt states that her face is better. Pt states I have done everything they have told me to do. Pt states she was seen at [facility] for her walking and balance issues and that this did help. Pt states she could not drive in the beginning. Pt states after PT she felt more secure and was able to drive about 4 or 5 months after onset. Pt reports she is now driving without issues. Patient states she has been to see a heart, liver, eye doctor. She was referred from the cardiologist to see Dr/ Richardson Landry. ENT physician. She saw Dr. Richardson Landry yesterday. Pt states they found some hearing loss in the left ear and states the tests for crystals was negative at the ENT office. Pt states she has an appointment with Dr. Melrose Nakayama tomorrow. Pt states she tried Clonopin, but that made her sleepy and did not help with her symptoms.  Pt states she wants to get better and have less dizziness. Pt states she is having dizziness daily. Pt states  when she gets up she feels fine but states when she gets up and starts walking and moving she gets dizzy. Pt states she touches the wall for balance. Pt states that it is more difficult to walk in the grocery store and states she needs to hold the grocery cart. Patient states that she is not able to go on a walk outside for exercise with her husband unless she were to hold his hand for balance. Pt reports taht she has had 2 MRI brain scans in the past year and that her cranial nerve#7 is still swollen. Pt states her left  inner ear still feels swollen.  Per cardiologist's visit note: She was diagnosed with Ramsay Hunt syndrome May 2022/about 1 year ago.  Patient had facial palsy, and symptoms of dizziness since then.  Left facial palsy has since improved, symptoms of dizziness have persisted.  Dizziness usually or course when she bends over, or while walking.  Denies palpitations, chest pain, shortness of breath, any history of heart disease. Dizziness, orthostatic vitals in the office with no evidence for orthostasis.  Symptoms of dizziness occurred after Ramsay Hunt's/herpes zoster infection.  Symptoms are not consistent with a cardiac etiology of dizziness.     PAIN:  Are you having pain? No  PRECAUTIONS: Fall  WEIGHT BEARING RESTRICTIONS No  PLOF: Independent, Independent with community mobility without device, Vocation/Vocational requirements: self-employed, and Leisure: travel, swim  PATIENT GOALS Pt would like to be able to walk better without imbalance, to be able to shop in grocery store without symptoms, be able to swim  SUBJECTIVE STATEMENT: Patient states she has not been feeling well with congestion and has been more tired for the past few weeks. Pt states she went to see the doctor yesterday and got a prescription for her cough that she is picking up today.   TODAY'S TREATMENT:  10/23/2021:  VOR X 1 exercise:  Patient performed walking VOR X 1 horizontal with conflicting background 75' multiple reps.  Added this progression for home exercise program.   Hallway ball toss:  In hallway, worked on ball toss against one wall with alternating quick turns to toss ball against opposite wall while tracking with eyes and head. Pt denied dizziness and had no imbalance with this activity noted.   Ambulation with head turns:   Patient performed 175' trials of forwards and retro ambulation with self-selected horizontal, vertical and diagonal head turns with contact-guard assistance. Pt with mild  imbalance noted with this activity.   Diona Foley toss over shoulder: Patient performed multiple 87' trials of forward and retro ambulation while tossing ball over one shoulder with return catch over opposite shoulder varying the ball position to head, shoulder and waist level to promote head turning and tilting with contact guard assistance.   10/16/2021: VOR X 1 exercise:  Patient performed VOR X 1 horizontal in standing with conflicting background 3 reps of 1 minute each with verbal cues for technique to increase speed of head turn without the target getting blurry or jumpy.  Patient reports mild dizziness with this activity.  Added this progression to home exercise program.  Quick Turns:  Patient performed 7 reps walking 8' with alternating quick turns left and right with contact-guard assistance.  Patient noted to have moderate imbalance with quick turning and needed to touch the wall over 80% of time. Added this progression for home program.  Ambulation with head turns:   Patient performed 175' trials of forwards and retro ambulation with diagonal head turns  with contact-guard assistance. Then worked on forward ambulation with diagonal head turns with alternating quick turns as cued by therapist.  Patient noted to have a few episodes of mild veering/unsteadiness at times with this activity.  8/15/2023Diona Foley toss over shoulder: Patient performed multiple 75 foot trials forward ambulation while tossing ball over one shoulder with return catch over opposite shoulder with contact-guard assist.  Then patient performed retroambulation with ball toss over shoulder guard assist.  Noted patient had more difficulty with this activity with decreased speed and moments of pausing for balance. Patient performed multiple 33' trials of forward ambulation while tossing ball over one shoulder with return catch over opposite shoulder varying the ball position to head, shoulder and waist level to promote head  turning and tilting.  This activity was also challenging for patient's balance but patient is able to perform with contact-guard assist. Patient reports mild dizziness with retroambulation and with forward ambulation with ball toss at varying levels.  Airex pad: On Airex pad, patient performed feet together and semitandem progressions with twisting at waist and looking over shoulder with contact-guard assist multiple reps. On Airex pad, patient performed with feet together eyes closed head stationary static stance holds for 10 to 30 seconds multiple repetitions.  Then, added eyes closed with horizontal and then vertical head turns multiple repetitions of 15 to 30 seconds. Patient required contact-guard to min assist at times secondary to loss of balance with above activities. Patient reports mild dizziness with above activities.  Airex balance beam: On Airex balance beam, performed sideways stepping with horizontal head turns 5' times 4 reps. On Airex balance beam, performed sideways stepping with vertical head turns 5' times 4 reps. Patient reports that above activities caused mild dizziness.  Patient with moderate imbalance with above activities and was able to perform with assistance ranging from contact-guard to min assist.  Body Wall Rolls:  Patient performed 6 reps of supported, body wall rolls with eyes closed with contact-guard assistance. Patient reports mild dizziness with this activity.  Noted patient more reliant on wall with eyes closed as compared to eyes open.  Ambulation with head turns: Patient performed forward ambulation 75 feet x 2 repetitions with diagonal head turns with contact-guard assistance. Patient reports increased dizziness with this activity and noted patient paused after each turn before completing next repetition.  PATIENT EDUCATION: Education details: Added walking VOR x1 with conflicting background progression to home exercise program; verbal cuing for  technique and form throughout session Person educated: Patient Education method: Explanation, Demonstration, and Verbal cues Education comprehension: verbalized understanding and returned demonstration  GOALS: Goals reviewed with patient? Yes  SHORT TERM GOALS: Target date: 12/04/2021   Pt will be independent with HEP in order to improve balance and decrease dizziness symptoms in order to decrease fall risk and improve function at home and work. Baseline: Pt reports independence with HEP 10/23/21 Goal status: MET  2.  Pt will self-report that she has been able to resume prior activities such as going for a walk outside and being able to shop at the grocery store without imbalance. Baseline: pt states she has to hold her husband's hand to be able to walk outside in the woods and reports she used a cart and had difficulty shopping recently due to her symptoms;  Goal status: INITIAL  LONG TERM GOALS: Target date: 01/15/2022    Patient will have improved FOTO score of 6 points or greater in order to demonstrate improvements in patient's ADLs and functional performance.  Baseline: 45 on 09/25/21; Goal status: INITIAL  2.  Patient will demonstrate reduced falls risk as evidenced by Dynamic Gait Index (DGI) >19/24. Baseline: 10/24 on 09/25/21; Goal status: INITIAL  3.  Patient will reduce falls risk as indicated by Activities Specific Balance Confidence Scale (ABC) >67%.      Patient will have demonstrate decreased falls risk as indicated by Activities Specific Balance Confidence Scale score of 80% or greater. Baseline: 45.6% on 09/25/21; Goal status: INITIAL  4.  Patient will reduce perceived disability to low levels as indicated by <40 on Dizziness Handicap Inventory. Baseline: 42/100 on 09/25/21;  Goal status: INITIAL  5.  Patient will report 50% or greater improvement in their symptoms of dizziness and imbalance with provoking motions.  Baseline: at eval, reports walking with head turns and  bending over increase symptoms;  Goal status: INITIAL   ASSESSMENT:  CLINICAL IMPRESSION: Patient reports that she has been doing well with the home exercise program. Patient demonstrated improvements in ambulation with head turning activities this date and was able to perform without loss of balance or veering. Patient challenged by retro ambulation. Patient will benefit from continued vestibular PT services to further address goals and to try to decrease patient's subjective symptoms of dizziness and imbalance.   OBJECTIVE IMPAIRMENTS decreased balance, difficulty walking, and dizziness.   ACTIVITY LIMITATIONS bending, stairs, and locomotion level  PARTICIPATION LIMITATIONS: cleaning, shopping, and walking outside  PERSONAL FACTORS Time since onset of injury/illness/exacerbation and 1-2 comorbidities: Ramsay Hunt syndrome, hyperlipidemia  are also affecting patient's functional outcome.   REHAB POTENTIAL: Good  CLINICAL DECISION MAKING: Evolving/moderate complexity  EVALUATION COMPLEXITY: Moderate   PLAN: PT FREQUENCY: 1-2x/week  PT DURATION: 12 weeks  PLANNED INTERVENTIONS: Therapeutic exercises, Therapeutic activity, Neuromuscular re-education, Balance training, Gait training, Patient/Family education, Self Care, Stair training, Vestibular training, and Canalith repositioning  PLAN FOR NEXT SESSION: Forward and retroambulation with diagonal self-selected head turns.  Ambulation with quick turns.    Lady Deutscher, PT 10/23/2021, 1:17 PM

## 2021-10-30 ENCOUNTER — Ambulatory Visit: Payer: No Typology Code available for payment source | Attending: Neurology | Admitting: Physical Therapy

## 2021-10-30 ENCOUNTER — Encounter: Payer: Self-pay | Admitting: Physical Therapy

## 2021-10-30 DIAGNOSIS — R2681 Unsteadiness on feet: Secondary | ICD-10-CM

## 2021-10-30 DIAGNOSIS — R42 Dizziness and giddiness: Secondary | ICD-10-CM

## 2021-10-30 NOTE — Therapy (Signed)
OUTPATIENT PHYSICAL THERAPY VESTIBULAR TREATMENT NOTE     Patient Name: Abigail Delacruz MRN: [id number] DOB:11-01-1950, 71 y.o., female Today's Date: 11/01/2021  PCP: Dr. Charlynne Cousins REFERRING PROVIDER: Dr. Gurney Maxin   PT End of Session - 11/01/21 1325     Visit Number 6    Number of Visits 24    Date for PT Re-Evaluation 12/18/21    PT Start Time 0902    PT Stop Time 0946    PT Time Calculation (min) 44 min    Equipment Utilized During Treatment Gait belt    Activity Tolerance Patient tolerated treatment well    Behavior During Therapy [facility] for tasks assessed/performed             Past Medical History:  Diagnosis Date   Cataract    Hyperlipidemia    Past Surgical History:  Procedure Laterality Date   APPENDECTOMY  1971   CATARACT EXTRACTION W/PHACO Right 09/08/2017   Procedure: CATARACT EXTRACTION PHACO AND INTRAOCULAR LENS PLACEMENT ([place])  RIGHT;  Surgeon: Eulogio Bear, MD;  Location: [place];  Service: Ophthalmology;  Laterality: Right;   CATARACT EXTRACTION W/PHACO Left 09/29/2017   Procedure: CATARACT EXTRACTION PHACO AND INTRAOCULAR LENS PLACEMENT ([place]) LEFT;  Surgeon: Eulogio Bear, MD;  Location: [place];  Service: Ophthalmology;  Laterality: Left;   COLONOSCOPY WITH PROPOFOL N/A 10/20/2017   Procedure: COLONOSCOPY WITH PROPOFOL;  Surgeon: Lucilla Lame, MD;  Location: [place];  Service: Endoscopy;  Laterality: N/A;   Patient Active Problem List   Diagnosis Date Noted   Hepatic cyst 06/20/2021   Elevated alkaline phosphatase level 06/20/2021   Annual physical exam 02/06/2021   Onychomycosis 02/06/2021   Dizziness 12/04/2020   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   Herpes zoster with ophthalmic complication 76/73/4193   Acute actinic otitis externa, left ear 06/30/2020   Facial cellulitis 06/30/2020   Hyperlipidemia    GERD (gastroesophageal reflux disease) 06/26/2020   Hyperlipidemia, mixed 10/01/2017    Chronic left shoulder pain 05/23/2017   ONSET DATE: 06/29/2020  REFERRING DIAG: R42 (ICD-10-CM) - Dizziness  THERAPY DIAG:  Dizziness and giddiness  Unsteadiness on feet  Rationale for Evaluation and Treatment Rehabilitation  SUBJECTIVE:   PERTINENT HISTORY:  Pt states that on Jun 29, 2020 she went to the ER and was found to have Ramsay Hunt 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 Pt states that her face is better. Pt states I have done everything they have told me to do. Pt states she was seen at [place] for her walking and balance issues and that this did help. Pt states she could not drive in the beginning. Pt states after PT she felt more secure and was able to drive about 4 or 5 months after onset. Pt reports she is now driving without issues. Patient states she has been to see a heart, liver, eye doctor. She was referred from the cardiologist to see Dr/ Richardson Landry. ENT physician. She saw Dr. Richardson Landry yesterday. Pt states they found some hearing loss in the left ear and states the tests for crystals was negative at the ENT office. Pt states she has an appointment with Dr. Melrose Nakayama tomorrow. Pt states she tried Clonopin, but that made her sleepy and did not help with her symptoms.  Pt states she wants to get better and have less dizziness. Pt states she is having dizziness daily. Pt states when she gets up she feels fine but states when she gets up  and starts walking and moving she gets dizzy. Pt states she touches the wall for balance. Pt states that it is more difficult to walk in the grocery store and states she needs to hold the grocery cart. Patient states that she is not able to go on a walk outside for exercise with her husband unless she were to hold his hand for balance. Pt reports taht she has had 2 MRI brain scans in the past year and that her cranial nerve#7 is still swollen. Pt states her left inner ear still feels swollen.  Per cardiologist's visit note: She was diagnosed  with Ramsay Hunt syndrome May 2022/about 1 year ago.  Patient had facial palsy, and symptoms of dizziness since then.  Left facial palsy has since improved, symptoms of dizziness have persisted.  Dizziness usually or course when she bends over, or while walking.  Denies palpitations, chest pain, shortness of breath, any history of heart disease. Dizziness, orthostatic vitals in the office with no evidence for orthostasis.  Symptoms of dizziness occurred after Ramsay Hunt's/herpes zoster infection.  Symptoms are not consistent with a cardiac etiology of dizziness.     PAIN:  Are you having pain? No  PRECAUTIONS: Fall  WEIGHT BEARING RESTRICTIONS No  PLOF: Independent, Independent with community mobility without device, Vocation/Vocational requirements: self-employed, and Leisure: travel, swim  PATIENT GOALS Pt would like to be able to walk better without imbalance, to be able to shop in grocery store without symptoms, be able to swim  SUBJECTIVE STATEMENT: Patient states she is feeling better but she still has a cough. Patient states that she does not have a temperature. Patient reports that she had "just a little bit" of dizziness and states it was not as much as before. Pt states she thinks it is getting better but states she has some days that are good and some not.   TODAY'S TREATMENT:  10/30/2021:  Worked on ambulating in busy visual environment in medical mall with patient scanning for targets as called out by therapist to promote horizontal and vertical head turning. Worked on ambulating outside on sidewalk and grassy area in straight paths with and without head turns vertical and horizontal. Then, worked on ambulation on sidewalk with alternating left/right quick turns multiple reps. Then, worked on ambulating on grassy area and sidewalk with self-selected head turns and left/right quick turns as called out by therapist.  Patient performed all above activities with contact guard assistance  due to mild imbalance noted.  When working on vertical head turns when looking up and then coming back down patient reports mild imbalance sensation and patient usually was needing to take a half step to correct as she would become off-balance.     10/23/2021: VOR X 1 exercise:  Patient performed walking VOR X 1 horizontal with conflicting background 70' multiple reps.  Added this progression for home exercise program.   Hallway ball toss:  In hallway, worked on ball toss against one wall with alternating quick turns to toss ball against opposite wall while tracking with eyes and head. Pt denied dizziness and had no imbalance with this activity noted.   Ambulation with head turns:   Patient performed 175' trials of forwards and retro ambulation with self-selected horizontal, vertical and diagonal head turns with contact-guard assistance. Pt with mild imbalance noted with this activity.   Diona Foley toss over shoulder: Patient performed multiple 1' trials of forward and retro ambulation while tossing ball over one shoulder with return catch over opposite shoulder  varying the ball position to head, shoulder and waist level to promote head turning and tilting with contact guard assistance.   10/16/2021: VOR X 1 exercise:  Patient performed VOR X 1 horizontal in standing with conflicting background 3 reps of 1 minute each with verbal cues for technique to increase speed of head turn without the target getting blurry or jumpy.  Patient reports mild dizziness with this activity.  Added this progression to home exercise program.  Quick Turns:  Patient performed 7 reps walking 8' with alternating quick turns left and right with contact-guard assistance.  Patient noted to have moderate imbalance with quick turning and needed to touch the wall over 80% of time. Added this progression for home program.  Ambulation with head turns:   Patient performed 175' trials of forwards and retro ambulation with  diagonal head turns with contact-guard assistance. Then worked on forward ambulation with diagonal head turns with alternating quick turns as cued by therapist.  Patient noted to have a few episodes of mild veering/unsteadiness at times with this activity.  8/15/2023Diona Foley toss over shoulder: Patient performed multiple 75 foot trials forward ambulation while tossing ball over one shoulder with return catch over opposite shoulder with contact-guard assist.  Then patient performed retroambulation with ball toss over shoulder guard assist.  Noted patient had more difficulty with this activity with decreased speed and moments of pausing for balance. Patient performed multiple 53' trials of forward ambulation while tossing ball over one shoulder with return catch over opposite shoulder varying the ball position to head, shoulder and waist level to promote head turning and tilting.  This activity was also challenging for patient's balance but patient is able to perform with contact-guard assist. Patient reports mild dizziness with retroambulation and with forward ambulation with ball toss at varying levels.  Airex pad: On Airex pad, patient performed feet together and semitandem progressions with twisting at waist and looking over shoulder with contact-guard assist multiple reps. On Airex pad, patient performed with feet together eyes closed head stationary static stance holds for 10 to 30 seconds multiple repetitions.  Then, added eyes closed with horizontal and then vertical head turns multiple repetitions of 15 to 30 seconds. Patient required contact-guard to min assist at times secondary to loss of balance with above activities. Patient reports mild dizziness with above activities.  Airex balance beam: On Airex balance beam, performed sideways stepping with horizontal head turns 5' times 4 reps. On Airex balance beam, performed sideways stepping with vertical head turns 5' times 4 reps. Patient  reports that above activities caused mild dizziness.  Patient with moderate imbalance with above activities and was able to perform with assistance ranging from contact-guard to min assist.  Body Wall Rolls:  Patient performed 6 reps of supported, body wall rolls with eyes closed with contact-guard assistance. Patient reports mild dizziness with this activity.  Noted patient more reliant on wall with eyes closed as compared to eyes open.  Ambulation with head turns: Patient performed forward ambulation 75 feet x 2 repetitions with diagonal head turns with contact-guard assistance. Patient reports increased dizziness with this activity and noted patient paused after each turn before completing next repetition.  PATIENT EDUCATION: Education details: verbal cuing for technique and form throughout session Person educated: Patient Education method: Explanation, Demonstration, and Verbal cues Education comprehension: verbalized understanding and returned demonstration  GOALS: Goals reviewed with patient? Yes  SHORT TERM GOALS: Target date: 12/18/2021   Pt will be independent with HEP in order  to improve balance and decrease dizziness symptoms in order to decrease fall risk and improve function at home and work. Baseline: Pt reports independence with HEP 10/23/21 Goal status: MET  2.  Pt will self-report that she has been able to resume prior activities such as going for a walk outside and being able to shop at the grocery store without imbalance. Baseline: pt states she has to hold her husband's hand to be able to walk outside in the woods and reports she used a cart and had difficulty shopping recently due to her symptoms; On 10/30/21, pt reports that she was able to walk around [place] and [place] with her friend without support without loss of balance but states she is careful as she still feels imbalance sensation at times.  Goal status: Ongoing  LONG TERM GOALS: Target date:  01/24/2022    Patient will have improved FOTO score of 6 points or greater in order to demonstrate improvements in patient's ADLs and functional performance.  Baseline: 45 on 09/25/21; Goal status: INITIAL  2.  Patient will demonstrate reduced falls risk as evidenced by Dynamic Gait Index (DGI) >19/24. Baseline: 10/24 on 09/25/21; Goal status: INITIAL  3.  Patient will reduce falls risk as indicated by Activities Specific Balance Confidence Scale (ABC) >67%.      Patient will have demonstrate decreased falls risk as indicated by Activities Specific Balance Confidence Scale score of 80% or greater. Baseline: 45.6% on 09/25/21; Goal status: INITIAL  4.  Patient will reduce perceived disability to low levels as indicated by <40 on Dizziness Handicap Inventory. Baseline: 42/100 on 09/25/21;  Goal status: INITIAL  5.  Patient will report 50% or greater improvement in their symptoms of dizziness and imbalance with provoking motions.  Baseline: at eval, reports walking with head turns and bending over increase symptoms;  Goal status: INITIAL   ASSESSMENT:  CLINICAL IMPRESSION: Patient states that she is feeling better this week after a bout of bronchitis per MR. Patient states that she feels she is improving in regards to her dizziness symptoms but states she is still getting some symptoms of imbalance at times. Worked on ambulation on non-compliant and compliant surfaces this date with quick turns and with and without horizontal and vertical head turns. Patient demonstrated good improvement with being able to maintain her balance with quick turns and decreased imbalance sensation with this activity. Pt does continue to report sensation of imbalance and mild dizziness when going from looking up to downward and would benefit from continued practice with this movement pattern next session. Patient will benefit from continued vestibular PT services to further address goals and to try to decrease patient's  subjective symptoms of dizziness and imbalance.   OBJECTIVE IMPAIRMENTS decreased balance, difficulty walking, and dizziness.   ACTIVITY LIMITATIONS bending, stairs, and locomotion level  PARTICIPATION LIMITATIONS: cleaning, shopping, and walking outside  PERSONAL FACTORS Time since onset of injury/illness/exacerbation and 1-2 comorbidities: Ramsay Hunt syndrome, hyperlipidemia  are also affecting patient's functional outcome.   REHAB POTENTIAL: Good  CLINICAL DECISION MAKING: Evolving/moderate complexity  EVALUATION COMPLEXITY: Moderate   PLAN: PT FREQUENCY: 1-2x/week  PT DURATION: 12 weeks  PLANNED INTERVENTIONS: Therapeutic exercises, Therapeutic activity, Neuromuscular re-education, Balance training, Gait training, Patient/Family education, Self Care, Stair training, Vestibular training, and Canalith repositioning  PLAN FOR NEXT SESSION: Forward and retroambulation with vertical head turns. Consider nose to knee habituation exercise and ball sorting activity with bin place high and then transferring balls to bin placed low to simulate  the going from looking up to down movement.   Lady Deutscher, PT 11/01/2021, 1:25 PM

## 2021-11-01 ENCOUNTER — Other Ambulatory Visit: Payer: Self-pay

## 2021-11-01 DIAGNOSIS — K7689 Other specified diseases of liver: Secondary | ICD-10-CM

## 2021-11-01 DIAGNOSIS — R748 Abnormal levels of other serum enzymes: Secondary | ICD-10-CM

## 2021-11-06 ENCOUNTER — Encounter: Payer: No Typology Code available for payment source | Admitting: Physical Therapy

## 2021-11-13 ENCOUNTER — Encounter: Payer: No Typology Code available for payment source | Admitting: Physical Therapy

## 2021-11-19 ENCOUNTER — Telehealth: Payer: Self-pay

## 2021-11-19 NOTE — Telephone Encounter (Signed)
-----   Message from Lurlean Nanny, [place] sent at 07/16/2021  4:50 PM EDT ----- Regarding: 6 mth US Abdomen Limited RUQ (LIVER/GB) Due on or after 11/19/21

## 2021-11-19 NOTE — Telephone Encounter (Deleted)
Good morning,  I called you and there was no answer and voicemail. I am contacting you in reference to your 6 month follow up ultrasound.   I have scheduled this for you at [facility] entrance for September 28th, arrive at 10:15 am to check in. You can not have anything to eat or drink after 12 midnight the day before.  If you need to cancel or reschedule, you can call the schedulers directly at [phone].  Please let me know when you receive this message.   Kind Regards,  [place] for Dr. Allen Norris

## 2021-11-20 NOTE — Telephone Encounter (Signed)
Left message on voicemail.

## 2021-11-22 ENCOUNTER — Ambulatory Visit: Payer: No Typology Code available for payment source

## 2021-11-27 ENCOUNTER — Encounter: Payer: No Typology Code available for payment source | Admitting: Physical Therapy

## 2021-11-28 ENCOUNTER — Ambulatory Visit: Payer: No Typology Code available for payment source | Admitting: Physical Therapy

## 2021-11-29 ENCOUNTER — Encounter: Payer: Self-pay | Admitting: Physician Assistant

## 2021-11-29 ENCOUNTER — Ambulatory Visit (INDEPENDENT_AMBULATORY_CARE_PROVIDER_SITE_OTHER): Payer: No Typology Code available for payment source | Admitting: Physician Assistant

## 2021-11-29 VITALS — BP 119/71 | HR 89 | Temp 98.9°F | Ht 62.01 in | Wt 184.6 lb

## 2021-11-29 DIAGNOSIS — J069 Acute upper respiratory infection, unspecified: Secondary | ICD-10-CM

## 2021-11-29 MED ORDER — AZITHROMYCIN 250 MG PO TABS: ORAL_TABLET | ORAL | 0 refills | Status: AC

## 2021-11-29 NOTE — Patient Instructions (Addendum)
   Symptoms can last for 3-10 days with lingering cough and intermittent symptoms lasting weeks after that.  Please use your inhaler to help with your breathing  I am sending in a script for an antibiotic to help with your symptoms  This is called Azithromycin - please follow the instructions on the packaging for dosing  Based on your described symptoms and the duration of symptoms it is likely that you have a viral upper respiratory infection (often called a "cold")  You can use over the counter medications such as Dayquil/Nyquil, AlkaSeltzer formulations, etc to provide further relief of symptoms according to the manufacturer's instructions  If preferred you can use Coricidin to manage your symptoms rather than those medications mentioned above.    If your symptoms do not improve or become worse in the next 5-7 days please make an apt at the office so we can see you  Go to the ER if you begin to have more serious symptoms such as shortness of breath, trouble breathing, loss of consciousness, swelling around the eyes, high fever, severe lasting headaches, vision changes or neck pain/stiffness.

## 2021-11-29 NOTE — Progress Notes (Signed)
Acute Office Visit   Patient: Abigail Delacruz   DOB: March 29, 1950   71 y.o. Female  MRN: [id number] Visit Date: 11/29/2021  Today's healthcare provider: Dani Gobble Micayla Brathwaite, PA-C  Introduced myself to the patient as a Journalist, newspaper and provided education on APPs in clinical practice.    Chief Complaint  Patient presents with   Cough    Returned from [place] on Tuesday, with some chest congestion.    Subjective    Cough Associated symptoms include headaches. Pertinent negatives include no chills, ear pain, fever or shortness of breath.   HPI     Cough    Additional comments: Returned from [place] on Tuesday, with some chest congestion.       Last edited by Jerelene Redden, CMA on 11/29/2021  1:49 PM.       States she has been in [place] since 11/06/21 Was still having cough and purchased cough medicine there - states she felt better She states when she landed she started having cough again and is now productive  Reports she is having lower leg swelling after her traveling She has not taken any COVID tests at home  Interventions: Orange juice with honey and cough drops She has not been using the inhaler       Medications: Outpatient Medications Prior to Visit  Medication Sig   albuterol (VENTOLIN HFA) 108 (90 Base) MCG/ACT inhaler Inhale 2 puffs into the lungs every 6 (six) hours as needed for wheezing or shortness of breath.   atorvastatin (LIPITOR) 40 MG tablet Take 1 tablet (40 mg total) by mouth daily.   CALCIUM-MAGNESIUM-ZINC PO Take 1 tablet by mouth daily.   Cholecalciferol (VITAMIN D3) 1.25 MG (50000 UT) TABS Take by mouth.   Multiple Vitamins-Minerals (MULTIVITAMIN ADULT) CHEW Chew by mouth.   omeprazole (PRILOSEC) 20 MG capsule Take 1 capsule (20 mg total) by mouth daily.   Spacer/Aero-Holding Chambers DEVI 1 Units by Does not apply route as needed.   benzonatate (TESSALON) 100 MG capsule Take 1 capsule (100 mg total) by mouth 2 (two) times daily as needed for  cough. (Patient not taking: Reported on 11/29/2021)   No facility-administered medications prior to visit.    Review of Systems  Constitutional:  Positive for fatigue. Negative for chills and fever.  HENT:  Positive for congestion. Negative for ear pain.   Respiratory:  Positive for cough and chest tightness. Negative for shortness of breath.   Gastrointestinal:  Positive for diarrhea. Negative for nausea and vomiting.  Neurological:  Positive for headaches. Negative for dizziness.       Objective    BP 119/71   Pulse 89   Temp 98.9 F (37.2 C) (Oral)   Ht 5' 2.01" (1.575 m)   Wt 184 lb 9.6 oz (83.7 kg)   SpO2 95%   BMI 33.76 kg/m    Physical Exam Vitals reviewed.  Constitutional:      General: She is awake.     Appearance: Normal appearance. She is well-developed and well-groomed.  HENT:     Head: Normocephalic and atraumatic.  Eyes:     General: Lids are normal. Gaze aligned appropriately.     Extraocular Movements: Extraocular movements intact.     Conjunctiva/sclera: Conjunctivae normal.  Cardiovascular:     Rate and Rhythm: Normal rate and regular rhythm.     Pulses: Normal pulses. No decreased pulses.          Radial pulses  are 2+ on the right side and 2+ on the left side.     Heart sounds: Normal heart sounds. No murmur heard.    No friction rub. No gallop.  Pulmonary:     Effort: Pulmonary effort is normal.     Breath sounds: Normal breath sounds. No decreased air movement. No decreased breath sounds, wheezing, rhonchi or rales.  Musculoskeletal:     Right lower leg: No edema.     Left lower leg: No edema.  Neurological:     General: No focal deficit present.     Mental Status: She is alert and oriented to person, place, and time.     GCS: GCS eye subscore is 4. GCS verbal subscore is 5. GCS motor subscore is 6.     Cranial Nerves: No dysarthria or facial asymmetry.  Psychiatric:        Attention and Perception: Attention and perception normal.         Mood and Affect: Mood and affect normal.        Speech: Speech normal.        Behavior: Behavior normal. Behavior is cooperative.       No results found for any visits on 11/29/21.  Assessment & Plan      No follow-ups on file.       Problem List Items Addressed This Visit   None Visit Diagnoses     Upper respiratory tract infection, unspecified type    -  Primary Acute, recurrent concern Reports symptoms seemed to have returned after traveling back from [place] She has not been using her inhaler previously rx'd at the end of Aug  Recommend she use this to assist with chest tightness Will send in zpack to assist with lingering cough and likely bronchial irritation Also recommend using OTC cough and cold medications for symptomatic relief.  Follow up as needed for progressing or persistent symptoms     Relevant Medications   azithromycin (ZITHROMAX) 250 MG tablet        No follow-ups on file.   I, Avrom Robarts E Matson Welch, PA-C, have reviewed all documentation for this visit. The documentation on 11/30/21 for the exam, diagnosis, procedures, and orders are all accurate and complete.   Talitha Givens, MHS, PA-C [facility]

## 2021-12-04 ENCOUNTER — Ambulatory Visit: Payer: Self-pay | Admitting: *Deleted

## 2021-12-04 ENCOUNTER — Ambulatory Visit: Payer: No Typology Code available for payment source | Admitting: Physical Therapy

## 2021-12-04 DIAGNOSIS — R051 Acute cough: Secondary | ICD-10-CM

## 2021-12-04 DIAGNOSIS — J40 Bronchitis, not specified as acute or chronic: Secondary | ICD-10-CM

## 2021-12-04 NOTE — Telephone Encounter (Signed)
Patient made aware of results and verbalized understanding.  

## 2021-12-04 NOTE — Telephone Encounter (Signed)
Summary: Cough/Congestion   Patient was seen on 11/29/21 for a URI and given azithromycin (ZITHROMAX) 250 MG tablet. Patient states she is not getting any better.       Chief Complaint: coughing spells worsening Symptoms: treated for URI 11/29/21. Continues to have coughing spells causing vomiting at times clear fluid. Cough worse at night unable to sleep , becoming tired. Has attempted to elevate on pillows at night without relief, using cough drops , requesting if any testing may be required. Completed prescribed zithromax 250 mg. Reports not getting better. Frequency: since 11/29/21 Pertinent Negatives: Patient denies chest pain no difficulty breathing, no fever. Disposition: '[]'$ ED /'[]'$ Urgent Care (no appt availability in office) / '[]'$ Appointment(In office/virtual)/ '[]'$  [facility]/ '[]'$ Home Care/ '[]'$ Refused Recommended Disposition /'[]'$  Mobile Bus/ '[x]'$  Follow-up with PCP Additional Notes:   Last seen 11/29/21. Sx persist coughing. Please advise. Does patient need another appt?      Reason for Disposition  [1] Continuous (nonstop) coughing interferes with work or school AND [2] no improvement using cough treatment per Care Advice  Answer Assessment - Initial Assessment Questions 1. ONSET: "When did the cough begin?"      Prior to 11/29/21 OV 2. SEVERITY: "How bad is the cough today?"      Coughing spells causing vomiting of clear fluid at times. Worse at night  3. SPUTUM: "Describe the color of your sputum" (none, dry cough; clear, white, yellow, green)     Yellow when productive  4. HEMOPTYSIS: "Are you coughing up any blood?" If so ask: "How much?" (flecks, streaks, tablespoons, etc.)     Na  5. DIFFICULTY BREATHING: "Are you having difficulty breathing?" If Yes, ask: "How bad is it?" (e.g., mild, moderate, severe)    - MILD: No SOB at rest, mild SOB with walking, speaks normally in sentences, can lie down, no retractions, pulse < 100.    - MODERATE: SOB at rest, SOB  with minimal exertion and prefers to sit, cannot lie down flat, speaks in phrases, mild retractions, audible wheezing, pulse 100-120.    - SEVERE: Very SOB at rest, speaks in single words, struggling to breathe, sitting hunched forward, retractions, pulse > 120      no 6. FEVER: "Do you have a fever?" If Yes, ask: "What is your temperature, how was it measured, and when did it start?"     na 7. CARDIAC HISTORY: "Do you have any history of heart disease?" (e.g., heart attack, congestive heart failure)      na 8. LUNG HISTORY: "Do you have any history of lung disease?"  (e.g., pulmonary embolus, asthma, emphysema)     na 9. PE RISK FACTORS: "Do you have a history of blood clots?" (or: recent major surgery, recent prolonged travel, bedridden)     na 10. OTHER SYMPTOMS: "Do you have any other symptoms?" (e.g., runny nose, wheezing, chest pain)       Continued coughing spells not able to sleep  11. PREGNANCY: "Is there any chance you are pregnant?" "When was your last menstrual period?"       na 12. TRAVEL: "Have you traveled out of the country in the last month?" (e.g., travel history, exposures)       na  Protocols used: Cough - Acute Non-Productive-A-AH

## 2021-12-05 ENCOUNTER — Ambulatory Visit
Admission: RE | Admit: 2021-12-05 | Discharge: 2021-12-05 | Disposition: A | Discharge status: Home / Self Care | Payer: No Typology Code available for payment source | Source: Ambulatory Visit | Attending: Physician Assistant | Admitting: Physician Assistant

## 2021-12-05 ENCOUNTER — Ambulatory Visit: Payer: Self-pay

## 2021-12-05 ENCOUNTER — Ambulatory Visit
Admission: RE | Admit: 2021-12-05 | Discharge: 2021-12-05 | Disposition: A | Discharge status: Home / Self Care | Payer: No Typology Code available for payment source | Attending: Physician Assistant | Admitting: Physician Assistant

## 2021-12-05 DIAGNOSIS — R051 Acute cough: Secondary | ICD-10-CM | POA: Diagnosis not present

## 2021-12-05 DIAGNOSIS — R059 Cough, unspecified: Secondary | ICD-10-CM | POA: Diagnosis not present

## 2021-12-05 NOTE — Telephone Encounter (Signed)
Called [phone] and spoke to [place], [employer] Rep at [employer]. She says there is no documentation in the patient's profile about a call from [place] and she's not seeing Atorvastatin in the profile. Advised it was sent to [facility] in March and she says she only sees Azithromycin on her profile sent from a different provider. Advised I will just close this out for now and maybe someone will call back with more explanation. After listening to the recorded call to the office, Loma Sousa mentioned she spoke to the patient last week and the patient told the pharmacist at [facility] that she was taking Atorvastatin 3 times a week even though it's prescribed daily. She says the patient agreed at that time to take it daily. Loma Sousa is calling to let the provider know so it can be discussed with the patient at the next OV.

## 2021-12-05 NOTE — Telephone Encounter (Signed)
Unable to reach anyone at the number listed. Called and was given [phone] or [phone] to speak to someone in specialty pharmacy. Will try the calls later.  Summary: med info   Loma Sousa called in from [facility], wants to speak with nurse about patient taking atorvastatin 1 a day, when before she was taking it 3 times a day. When calling, please ask for  member services and asked to be connected to [facility] team.

## 2021-12-06 ENCOUNTER — Other Ambulatory Visit: Payer: Self-pay | Admitting: Physician Assistant

## 2021-12-06 DIAGNOSIS — J9801 Acute bronchospasm: Secondary | ICD-10-CM

## 2021-12-06 MED ORDER — PREDNISONE 20 MG PO TABS: 40.0000 mg | ORAL_TABLET | Freq: Every day | ORAL | 0 refills | Status: AC

## 2021-12-10 DIAGNOSIS — R2681 Unsteadiness on feet: Secondary | ICD-10-CM | POA: Diagnosis not present

## 2021-12-10 DIAGNOSIS — Z008 Encounter for other general examination: Secondary | ICD-10-CM | POA: Diagnosis not present

## 2021-12-10 DIAGNOSIS — E785 Hyperlipidemia, unspecified: Secondary | ICD-10-CM | POA: Diagnosis not present

## 2021-12-10 DIAGNOSIS — R7303 Prediabetes: Secondary | ICD-10-CM | POA: Diagnosis not present

## 2021-12-10 DIAGNOSIS — Z6831 Body mass index (BMI) 31.0-31.9, adult: Secondary | ICD-10-CM | POA: Diagnosis not present

## 2021-12-10 DIAGNOSIS — F17211 Nicotine dependence, cigarettes, in remission: Secondary | ICD-10-CM | POA: Diagnosis not present

## 2021-12-10 DIAGNOSIS — K219 Gastro-esophageal reflux disease without esophagitis: Secondary | ICD-10-CM | POA: Diagnosis not present

## 2021-12-10 DIAGNOSIS — E669 Obesity, unspecified: Secondary | ICD-10-CM | POA: Diagnosis not present

## 2021-12-11 ENCOUNTER — Encounter: Payer: No Typology Code available for payment source | Admitting: Physical Therapy

## 2021-12-18 ENCOUNTER — Ambulatory Visit: Payer: No Typology Code available for payment source | Admitting: Physician Assistant

## 2021-12-18 ENCOUNTER — Ambulatory Visit (INDEPENDENT_AMBULATORY_CARE_PROVIDER_SITE_OTHER): Payer: No Typology Code available for payment source | Admitting: Nurse Practitioner

## 2021-12-18 ENCOUNTER — Ambulatory Visit
Admission: RE | Admit: 2021-12-18 | Discharge: 2021-12-18 | Disposition: A | Discharge status: Home / Self Care | Payer: No Typology Code available for payment source | Attending: Nurse Practitioner | Admitting: Nurse Practitioner

## 2021-12-18 ENCOUNTER — Encounter: Payer: Self-pay | Admitting: Nurse Practitioner

## 2021-12-18 ENCOUNTER — Ambulatory Visit
Admission: RE | Admit: 2021-12-18 | Discharge: 2021-12-18 | Disposition: A | Discharge status: Home / Self Care | Payer: No Typology Code available for payment source | Source: Ambulatory Visit | Attending: Nurse Practitioner | Admitting: Nurse Practitioner

## 2021-12-18 ENCOUNTER — Ambulatory Visit: Payer: No Typology Code available for payment source | Admitting: Physical Therapy

## 2021-12-18 VITALS — BP 121/70 | HR 73 | Temp 98.7°F | Wt 174.6 lb

## 2021-12-18 DIAGNOSIS — R1013 Epigastric pain: Secondary | ICD-10-CM

## 2021-12-18 MED ORDER — PANTOPRAZOLE SODIUM 20 MG PO TBEC: 20.0000 mg | DELAYED_RELEASE_TABLET | Freq: Every day | ORAL | 0 refills | Status: DC

## 2021-12-18 NOTE — Progress Notes (Signed)
BP 121/70   Pulse 73   Temp 98.7 F (37.1 C) (Oral)   Wt 174 lb 9.6 oz (79.2 kg)   SpO2 96%   BMI 31.93 kg/m    Subjective:    Patient ID: Abigail Delacruz, female    DOB: 03-29-1950, 71 y.o.   MRN: [id number]  HPI: Abigail Delacruz is a 71 y.o. female  Chief Complaint  Patient presents with   Nausea    Patient reports she started feeling unwell Sunday, c/o burping, nausea, and epigastric pain. Pt reports 2 episodes of diarrhea. Able to keep tea and ginger ale down, had some soup as well. This morning had small amount of diarrhea/soft stools. Denies cough, but feels chest congestion . Recently came back from [place] 11/27/2021.   ABDOMINAL ISSUES Patient states she had a cold on 11/29/21- her cough is almost gone.  States she still has a lot of mucus that she can't get rid of.   Duration:  symptoms since Sunday - felt like she had a bowl in her stomach Petra Kuba:  feels like she wants to throw up Location: epigastric  Severity: moderate  Radiation: no Episode duration: Frequency: constant Alleviating factors: Aggravating factors: Treatments attempted:  lemon with backing soda and water Constipation:  2 weeks ago she was constipated  Diarrhea: yes Episodes of diarrhea/day: Mucous in the stool: no Heartburn: yes Bloating:yes Flatulence: yes Nausea: yes Vomiting: yes Episodes of vomit/day: Melena or hematochezia: no Rash: no Jaundice: no Fever: no Weight loss: no  Relevant past medical, surgical, family and social history reviewed and updated as indicated. Interim medical history since our last visit reviewed. Allergies and medications reviewed and updated.  Review of Systems  Gastrointestinal:  Positive for abdominal pain, constipation, diarrhea, nausea and vomiting.    Per HPI unless specifically indicated above     Objective:    BP 121/70   Pulse 73   Temp 98.7 F (37.1 C) (Oral)   Wt 174 lb 9.6 oz (79.2 kg)   SpO2 96%   BMI 31.93 kg/m   Wt Readings  from Last 3 Encounters:  12/18/21 174 lb 9.6 oz (79.2 kg)  11/29/21 184 lb 9.6 oz (83.7 kg)  10/22/21 187 lb (84.8 kg)    Physical Exam Vitals and nursing note reviewed.  Constitutional:      General: She is not in acute distress.    Appearance: Normal appearance. She is normal weight. She is not ill-appearing, toxic-appearing or diaphoretic.  HENT:     Head: Normocephalic.     Right Ear: External ear normal.     Left Ear: External ear normal.     Nose: Nose normal.     Mouth/Throat:     Mouth: Mucous membranes are moist.     Pharynx: Oropharynx is clear.  Eyes:     General:        Right eye: No discharge.        Left eye: No discharge.     Extraocular Movements: Extraocular movements intact.     Conjunctiva/sclera: Conjunctivae normal.     Pupils: Pupils are equal, round, and reactive to light.  Cardiovascular:     Rate and Rhythm: Normal rate and regular rhythm.     Heart sounds: No murmur heard. Pulmonary:     Effort: Pulmonary effort is normal. No respiratory distress.     Breath sounds: Normal breath sounds. No wheezing or rales.  Abdominal:     General: Abdomen is flat. Bowel sounds are  normal. There is no distension.     Palpations: Abdomen is soft. There is no mass.     Tenderness: There is no abdominal tenderness. There is no right CVA tenderness, left CVA tenderness, guarding or rebound.     Hernia: No hernia is present.  Musculoskeletal:     Cervical back: Normal range of motion and neck supple.  Skin:    General: Skin is warm and dry.     Capillary Refill: Capillary refill takes less than 2 seconds.  Neurological:     General: No focal deficit present.     Mental Status: She is alert and oriented to person, place, and time. Mental status is at baseline.  Psychiatric:        Mood and Affect: Mood normal.        Behavior: Behavior normal.        Thought Content: Thought content normal.        Judgment: Judgment normal.     Results for orders placed or  performed in visit on 08/01/21  CBC with Differential/Platelet  Result Value Ref Range   WBC 5.3 3.4 - 10.8 x10E3/uL   RBC 4.70 3.77 - 5.28 x10E6/uL   Hemoglobin 14.2 11.1 - 15.9 g/dL   Hematocrit 43.3 34.0 - 46.6 %   MCV 92 79 - 97 fL   MCH 30.2 26.6 - 33.0 pg   MCHC 32.8 31.5 - 35.7 g/dL   RDW 13.1 11.7 - 15.4 %   Platelets 242 150 - 450 x10E3/uL   Neutrophils 56 Not Estab. %   Lymphs 32 Not Estab. %   Monocytes 8 Not Estab. %   Eos 3 Not Estab. %   Basos 1 Not Estab. %   Neutrophils Absolute 3.0 1.4 - 7.0 x10E3/uL   Lymphocytes Absolute 1.7 0.7 - 3.1 x10E3/uL   Monocytes Absolute 0.4 0.1 - 0.9 x10E3/uL   EOS (ABSOLUTE) 0.1 0.0 - 0.4 x10E3/uL   Basophils Absolute 0.0 0.0 - 0.2 x10E3/uL   Immature Granulocytes 0 Not Estab. %   Immature Grans (Abs) 0.0 0.0 - 0.1 x10E3/uL  Comprehensive metabolic panel  Result Value Ref Range   Glucose 102 (H) 70 - 99 mg/dL   BUN 19 8 - 27 mg/dL   Creatinine, Ser 0.67 0.57 - 1.00 mg/dL   eGFR 93 >59 mL/min/1.73   BUN/Creatinine Ratio 28 12 - 28   Sodium 141 134 - 144 mmol/L   Potassium 4.4 3.5 - 5.2 mmol/L   Chloride 105 96 - 106 mmol/L   CO2 24 20 - 29 mmol/L   Calcium 9.3 8.7 - 10.3 mg/dL   Total Protein 7.0 6.0 - 8.5 g/dL   Albumin 4.4 3.7 - 4.7 g/dL   Globulin, Total 2.6 1.5 - 4.5 g/dL   Albumin/Globulin Ratio 1.7 1.2 - 2.2   Bilirubin Total 0.3 0.0 - 1.2 mg/dL   Alkaline Phosphatase 169 (H) 44 - 121 IU/L   AST 24 0 - 40 IU/L   ALT 39 (H) 0 - 32 IU/L      Assessment & Plan:   Problem List Items Addressed This Visit   None Visit Diagnoses     Epigastric pain    -  Primary   Suspect constipation and causing epigastric pain. Obtain xray to confirm. Will change to Pantroprazole from Omeprazole. Start Miralax and Colace twice daily.   Relevant Orders   DG Abd 2 Views        Follow up plan: Return if symptoms worsen or  fail to improve.

## 2021-12-18 NOTE — Patient Instructions (Signed)
Miralax twice daily  Colace twice daily

## 2021-12-20 ENCOUNTER — Ambulatory Visit: Payer: Self-pay

## 2021-12-20 NOTE — Telephone Encounter (Signed)
Chief Complaint: Miralax and Colace Additional Notes: Last OV was told to take Miralax and Colace, so she thought it was a prescription. She asks is she supposed to buy OTC, advised no Rx was sent, so it can be purchased OTC. She verbalized understanding.   Summary: discuss additional medication from previous visit   Pt called in for assistance. Pt says in most recent ov with provider she was told that she should take (Start Miralax and Colace twice daily) pt says that she didn't receive a Rx, pt would like to discuss with a nurse further.      Reason for Disposition  Caller has medicine question only, adult not sick, AND triager answers question  Answer Assessment - Initial Assessment Questions 1. NAME of MEDICINE: "What medicine(s) are you calling about?"     Miralax and colace 2. QUESTION: "What is your question?" (e.g., double dose of medicine, side effect)     I thought I was getting a Rx, but it wasn't at the pharmacy 3. PRESCRIBER: "Who prescribed the medicine?" Reason: if prescribed by specialist, call should be referred to that group.     Jon Billings  Protocols used: Medication Question Call-A-AH

## 2021-12-21 NOTE — Progress Notes (Signed)
Please let patient know that her xray did not show any stool.  Please find out if her symptoms improved.

## 2021-12-24 ENCOUNTER — Ambulatory Visit: Payer: No Typology Code available for payment source | Admitting: Nurse Practitioner

## 2021-12-25 ENCOUNTER — Ambulatory Visit: Payer: No Typology Code available for payment source | Attending: Neurology | Admitting: Physical Therapy

## 2021-12-25 DIAGNOSIS — R2681 Unsteadiness on feet: Secondary | ICD-10-CM | POA: Diagnosis not present

## 2021-12-25 DIAGNOSIS — R42 Dizziness and giddiness: Secondary | ICD-10-CM | POA: Diagnosis not present

## 2021-12-25 NOTE — Therapy (Signed)
OUTPATIENT PHYSICAL THERAPY VESTIBULAR TREATMENT NOTE/RECERTIFICATION   Patient Name: Abigail Delacruz MRN: [id number] DOB:23-Mar-1950, 71 y.o., female Today's Date: 12/28/2021  PCP: Dr. Charlynne Cousins REFERRING PROVIDER: Dr. Gurney Maxin   PT End of Session - 12/28/21 1154     Visit Number 7    Number of Visits 24    Date for PT Re-Evaluation 02/20/22    PT Start Time 1033    PT Stop Time 1120    PT Time Calculation (min) 47 min    Equipment Utilized During Treatment Gait belt    Activity Tolerance Patient tolerated treatment well    Behavior During Therapy [facility] for tasks assessed/performed            Past Medical History:  Diagnosis Date   Cataract    Hyperlipidemia    Past Surgical History:  Procedure Laterality Date   APPENDECTOMY  1971   CATARACT EXTRACTION W/PHACO Right 09/08/2017   Procedure: CATARACT EXTRACTION PHACO AND INTRAOCULAR LENS PLACEMENT ([place])  RIGHT;  Surgeon: Eulogio Bear, MD;  Location: [facility];  Service: Ophthalmology;  Laterality: Right;   CATARACT EXTRACTION W/PHACO Left 09/29/2017   Procedure: CATARACT EXTRACTION PHACO AND INTRAOCULAR LENS PLACEMENT ([place]) LEFT;  Surgeon: Eulogio Bear, MD;  Location: [facility];  Service: Ophthalmology;  Laterality: Left;   COLONOSCOPY WITH PROPOFOL N/A 10/20/2017   Procedure: COLONOSCOPY WITH PROPOFOL;  Surgeon: Lucilla Lame, MD;  Location: [facility];  Service: Endoscopy;  Laterality: N/A;   Patient Active Problem List   Diagnosis Date Noted   Hepatic cyst 06/20/2021   Elevated alkaline phosphatase level 06/20/2021   Annual physical exam 02/06/2021   Onychomycosis 02/06/2021   Dizziness 12/04/2020   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   Herpes zoster with ophthalmic complication 62/69/4854   Acute actinic otitis externa, left ear 06/30/2020   Facial cellulitis 06/30/2020   Hyperlipidemia    GERD (gastroesophageal reflux disease) 06/26/2020   Hyperlipidemia, mixed  10/01/2017   Chronic left shoulder pain 05/23/2017   ONSET DATE: 06/29/2020  REFERRING DIAG: R42 (ICD-10-CM) - Dizziness  THERAPY DIAG:  Dizziness and giddiness  Unsteadiness on feet  Rationale for Evaluation and Treatment Rehabilitation  SUBJECTIVE:   PERTINENT HISTORY:  Pt states that on Jun 29, 2020 she went to the ER and was found to have Ramsay Hunt 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 Pt states that her face is better. Pt states I have done everything they have told me to do. Pt states she was seen at [facility] for her walking and balance issues and that this did help. Pt states she could not drive in the beginning. Pt states after PT she felt more secure and was able to drive about 4 or 5 months after onset. Pt reports she is now driving without issues. Patient states she has been to see a heart, liver, eye doctor. She was referred from the cardiologist to see Dr/ Richardson Landry. ENT physician. She saw Dr. Richardson Landry yesterday. Pt states they found some hearing loss in the left ear and states the tests for crystals was negative at the ENT office. Pt states she has an appointment with Dr. Melrose Nakayama tomorrow. Pt states she tried Clonopin, but that made her sleepy and did not help with her symptoms.  Pt states she wants to get better and have less dizziness. Pt states she is having dizziness daily. Pt states when she gets up she feels fine but states when she gets up and starts walking  and moving she gets dizzy. Pt states she touches the wall for balance. Pt states that it is more difficult to walk in the grocery store and states she needs to hold the grocery cart. Patient states that she is not able to go on a walk outside for exercise with her husband unless she were to hold his hand for balance. Pt reports taht she has had 2 MRI brain scans in the past year and that her cranial nerve#7 is still swollen. Pt states her left inner ear still feels swollen.  Per cardiologist's visit note: She was  diagnosed with Ramsay Hunt syndrome May 2022/about 1 year ago.  Patient had facial palsy, and symptoms of dizziness since then.  Left facial palsy has since improved, symptoms of dizziness have persisted.  Dizziness usually or course when she bends over, or while walking.  Denies palpitations, chest pain, shortness of breath, any history of heart disease. Dizziness, orthostatic vitals in the office with no evidence for orthostasis.  Symptoms of dizziness occurred after Ramsay Hunt's/herpes zoster infection.  Symptoms are not consistent with a cardiac etiology of dizziness.     PAIN:  Are you having pain? No  PRECAUTIONS: Fall  WEIGHT BEARING RESTRICTIONS: No  PLOF: Independent, Independent with community mobility without device, Vocation/Vocational requirements: self-employed, and Leisure: travel, swim  PATIENT GOALS: Pt would like to be able to walk better without imbalance, to be able to shop in grocery store without symptoms, be able to swim  SUBJECTIVE STATEMENT: Patient states she has not been doing her exercises since she has been away. Pt states she went to [place] and when she got back she started having coughing episodes. Pt sates she went to the doctor. Pt states then she started having a "knot" in her stomach and she went to the MD. Pt states she has good and bad days in regard to dizziness. Pt states she feels steadier when she walks now. Pt states she feels much better and safer now.  TODAY'S TREATMENT:  12/25/2021: FUNCTIONAL OUTCOME MEASURES:  Results Comments  DHI 32/100 Low perception of handicap  ABC Scale 55% Falls risk; in need of intervention  DGI 19/24 Falls risk; in need of intervention  FOTO 52 in need of intervention    [facility] PT Assessment - 12/28/21 1150       Dynamic Gait Index   Level Surface Mild Impairment    Change in Gait Speed Moderate Impairment    Gait with Horizontal Head Turns Normal    Gait with Vertical Head Turns Normal    Gait and Pivot  Turn Normal    Step Over Obstacle Mild Impairment    Step Around Obstacles Normal    Steps Mild Impairment    Total Score 19             PATIENT EDUCATION: Education details: verbal cuing for technique and form throughout session Person educated: Patient Education method: Explanation, Demonstration, and Verbal cues Education comprehension: verbalized understanding and returned demonstration  HOME EXERCISE PROGRAM:  GOALS: Goals reviewed with patient? Yes  SHORT TERM GOALS: Target date: 12/18/2021   Pt will be independent with HEP in order to improve balance and decrease dizziness symptoms in order to decrease fall risk and improve function at home and work. Baseline: Pt reports independence with HEP 10/23/21 Goal status: MET  2.  Pt will self-report that she has been able to resume prior activities such as going for a walk outside and being able to shop at  the grocery store without imbalance. Baseline: pt states she has to hold her husband's hand to be able to walk outside in the woods and reports she used a cart and had difficulty shopping recently due to her symptoms; On 10/30/21, pt reports that she was able to walk around [place] and [place] with her friend without support without loss of balance but states she is careful as she still feels imbalance sensation at times.  Goal status: Met  LONG TERM GOALS: Target date: 01/24/2022    Patient will have improved FOTO score of 6 points or greater in order to demonstrate improvements in patient's ADLs and functional performance.  Baseline: 45 on 09/25/21; 52 scored on 12/25/21 Goal status: Met  2.  Patient will demonstrate reduced falls risk as evidenced by Dynamic Gait Index (DGI) >19/24. Baseline: 10/24 on 09/25/21; scored 19/24 on 12/25/2021 Goal status: partially met  3.  Patient will reduce falls risk as indicated by Activities Specific Balance Confidence Scale (ABC) >67%.      Patient will have demonstrate decreased  falls risk as indicated by Activities Specific Balance Confidence Scale score of 80% or greater. Baseline: 45.6% on 09/25/21; scored 55% on 12/25/21 Goal status: partially met  4.  Patient will reduce perceived disability to low levels as indicated by <40 on Dizziness Handicap Inventory. Baseline: 42/100 on 09/25/21; scored 32/100 on 12/25/2021 Goal status: met  5.  Patient will report 50% or greater improvement in their symptoms of dizziness and imbalance with provoking motions.  Baseline: at eval, reports walking with head turns and bending over increase symptoms; reports 50% improvement on 12/25/21 Goal status: met  ASSESSMENT:  CLINICAL IMPRESSION: Patient states that she is feeling better this week after a bout of bronchitis per MR. Patient states that she feels she is improving in regards to her dizziness symptoms but states she is still getting some symptoms of imbalance at times. Worked on ambulation on non-compliant and compliant surfaces this date with quick turns and with and without horizontal and vertical head turns. Patient demonstrated good improvement with being able to maintain her balance with quick turns and decreased imbalance sensation with this activity. Pt does continue to report sensation of imbalance and mild dizziness when going from looking up to downward and would benefit from continued practice with this movement pattern next session. Patient will benefit from continued vestibular PT services to further address goals and to try to decrease patient's subjective symptoms of dizziness and imbalance.   OBJECTIVE IMPAIRMENTS: decreased balance, difficulty walking, and dizziness.   ACTIVITY LIMITATIONS: bending, stairs, and locomotion level  PARTICIPATION LIMITATIONS: cleaning, shopping, and walking outside  PERSONAL FACTORS: Time since onset of injury/illness/exacerbation and 1-2 comorbidities: Ramsay Hunt syndrome, hyperlipidemia  are also affecting patient's functional  outcome.   REHAB POTENTIAL: Good  CLINICAL DECISION MAKING: Evolving/moderate complexity  EVALUATION COMPLEXITY: Moderate   PLAN:  PT FREQUENCY: 1-2x/week  PT DURATION: 12 weeks  PLANNED INTERVENTIONS: Therapeutic exercises, Therapeutic activity, Neuromuscular re-education, Balance training, Gait training, Patient/Family education, Self Care, Stair training, Vestibular training, and Canalith repositioning  PLAN FOR NEXT SESSION: Forward and retroambulation with vertical head turns. Consider nose to knee habituation exercise and ball sorting activity with bin place high and then transferring balls to bin placed low to simulate the going from looking up to down movement.   Lady Deutscher, PT 12/28/2021, 11:56 AM

## 2021-12-27 DIAGNOSIS — R42 Dizziness and giddiness: Secondary | ICD-10-CM | POA: Diagnosis not present

## 2021-12-27 DIAGNOSIS — G510 Bell's palsy: Secondary | ICD-10-CM | POA: Diagnosis not present

## 2021-12-27 DIAGNOSIS — B0221 Postherpetic geniculate ganglionitis: Secondary | ICD-10-CM | POA: Diagnosis not present

## 2021-12-27 DIAGNOSIS — H9202 Otalgia, left ear: Secondary | ICD-10-CM | POA: Diagnosis not present

## 2021-12-28 ENCOUNTER — Encounter: Payer: Self-pay | Admitting: Physical Therapy

## 2022-01-01 ENCOUNTER — Ambulatory Visit: Payer: No Typology Code available for payment source | Attending: Neurology | Admitting: Physical Therapy

## 2022-01-01 ENCOUNTER — Encounter: Payer: Self-pay | Admitting: Physical Therapy

## 2022-01-01 DIAGNOSIS — R42 Dizziness and giddiness: Secondary | ICD-10-CM | POA: Diagnosis not present

## 2022-01-01 DIAGNOSIS — R2681 Unsteadiness on feet: Secondary | ICD-10-CM | POA: Insufficient documentation

## 2022-01-01 NOTE — Therapy (Signed)
OUTPATIENT PHYSICAL THERAPY VESTIBULAR TREATMENT NOTE   Patient Name: Abigail Delacruz MRN: [id number] DOB:1950/08/19, 71 y.o., female Today's Date: 01/01/2022  PCP: Dr. Charlynne Cousins REFERRING PROVIDER: Dr. Gurney Maxin   PT End of Session - 01/01/22 1059     Visit Number 8    Number of Visits 24    Date for PT Re-Evaluation 02/20/22    PT Start Time 1057    PT Stop Time 1145    PT Time Calculation (min) 48 min    Equipment Utilized During Treatment Gait belt    Activity Tolerance Patient tolerated treatment well    Behavior During Therapy [facility] for tasks assessed/performed             Past Medical History:  Diagnosis Date   Cataract    Hyperlipidemia    Past Surgical History:  Procedure Laterality Date   APPENDECTOMY  1971   CATARACT EXTRACTION W/PHACO Right 09/08/2017   Procedure: CATARACT EXTRACTION PHACO AND INTRAOCULAR LENS PLACEMENT ([place])  RIGHT;  Surgeon: Eulogio Bear, MD;  Location: [facility];  Service: Ophthalmology;  Laterality: Right;   CATARACT EXTRACTION W/PHACO Left 09/29/2017   Procedure: CATARACT EXTRACTION PHACO AND INTRAOCULAR LENS PLACEMENT ([place]) LEFT;  Surgeon: Eulogio Bear, MD;  Location: [place];  Service: Ophthalmology;  Laterality: Left;   COLONOSCOPY WITH PROPOFOL N/A 10/20/2017   Procedure: COLONOSCOPY WITH PROPOFOL;  Surgeon: Lucilla Lame, MD;  Location: [facility];  Service: Endoscopy;  Laterality: N/A;   Patient Active Problem List   Diagnosis Date Noted   Hepatic cyst 06/20/2021   Elevated alkaline phosphatase level 06/20/2021   Annual physical exam 02/06/2021   Onychomycosis 02/06/2021   Dizziness 12/04/2020   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   Herpes zoster with ophthalmic complication 40/98/1191   Acute actinic otitis externa, left ear 06/30/2020   Facial cellulitis 06/30/2020   Hyperlipidemia    GERD (gastroesophageal reflux disease) 06/26/2020   Hyperlipidemia, mixed 10/01/2017    Chronic left shoulder pain 05/23/2017   ONSET DATE: 06/29/2020  REFERRING DIAG: R42 (ICD-10-CM) - Dizziness  THERAPY DIAG:  Dizziness and giddiness  Unsteadiness on feet  Rationale for Evaluation and Treatment Rehabilitation  SUBJECTIVE:   PERTINENT HISTORY:  Pt states that on Jun 29, 2020 she went to the ER and was found to have Ramsay Hunt 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 Pt states that her face is better. Pt states I have done everything they have told me to do. Pt states she was seen at [facility] for her walking and balance issues and that this did help. Pt states she could not drive in the beginning. Pt states after PT she felt more secure and was able to drive about 4 or 5 months after onset. Pt reports she is now driving without issues. Patient states she has been to see a heart, liver, eye doctor. She was referred from the cardiologist to see Dr/ Richardson Landry. ENT physician. She saw Dr. Richardson Landry yesterday. Pt states they found some hearing loss in the left ear and states the tests for crystals was negative at the ENT office. Pt states she has an appointment with Dr. Melrose Nakayama tomorrow. Pt states she tried Clonopin, but that made her sleepy and did not help with her symptoms.  Pt states she wants to get better and have less dizziness. Pt states she is having dizziness daily. Pt states when she gets up she feels fine but states when she gets up and starts  walking and moving she gets dizzy. Pt states she touches the wall for balance. Pt states that it is more difficult to walk in the grocery store and states she needs to hold the grocery cart. Patient states that she is not able to go on a walk outside for exercise with her husband unless she were to hold his hand for balance. Pt reports taht she has had 2 MRI brain scans in the past year and that her cranial nerve#7 is still swollen. Pt states her left inner ear still feels swollen.  Per cardiologist's visit note: She was diagnosed with  Ramsay Hunt syndrome May 2022/about 1 year ago.  Patient had facial palsy, and symptoms of dizziness since then.  Left facial palsy has since improved, symptoms of dizziness have persisted.  Dizziness usually or course when she bends over, or while walking.  Denies palpitations, chest pain, shortness of breath, any history of heart disease. Dizziness, orthostatic vitals in the office with no evidence for orthostasis.  Symptoms of dizziness occurred after Ramsay Hunt's/herpes zoster infection.  Symptoms are not consistent with a cardiac etiology of dizziness.     PAIN:  Are you having pain? No  PRECAUTIONS: Fall  WEIGHT BEARING RESTRICTIONS: No  PLOF: Independent, Independent with community mobility without device, Vocation/Vocational requirements: self-employed, and Leisure: travel, swim  PATIENT GOALS: Pt would like to be able to walk better without imbalance, to be able to shop in grocery store without symptoms, be able to swim  SUBJECTIVE STATEMENT: Pt states yesterday was a really good day, but states today she feels off-balance states she feels like she is drunk walking. States she feels heavier around her eyes but states she does not let it get her down.   TODAY'S TREATMENT:  01/01/2022:  Ambulation with head turns:  Patient performed 175' forwards and retro ambulation while scanning for targets in hallway.  Patient performed 175' trials of forwards random head turns including horizontal, vertical and diagonal with contact-guard assistance. Patient performed multiple 175 feet trials of retro ambulation with horizontal, vertical, diagonal and self-selected head turns with contact guard assistance.   Patient demonstrates mild veering at times and had a few episodes where she would stop for a few seconds before resuming walking with retro ambulation.  Patient denies dizziness with these activities this date.  Ball Sorting Activity: On firm surface, performed transferring multicolored  balls from bin placed on floor to another bin placed 180 degrees on the other side of patient while patient visually tracks the ball so that patient is required to turn 180 degrees Left and Right to turn to bend over and pick and place balls in the bins with guard assistance.  Did well with this activity demonstrating no imbalance and patient denied dizziness.  Then repeated activity while standing on a red foam floor mat.  Patient had mild imbalance but no overt losses of balance with this activity and denies dizziness.  Diona Foley toss to self:  Patient performed ambulation 75' feet forward while tossing ball to self horizontal and then vertical while tracking ball with head and eyes and then performed 75 feet retrotrials of these activities with contact-guard assistance.  Patient denies dizziness with this activity.   Diona Foley toss over shoulder: Patient performed multiple 45' trials of forward ambulation while tossing ball over one shoulder with return catch over opposite shoulder with contact-guard assistance.  Then repeated while varying the ball position to head, shoulder and waist level to promote head turning and tilting. Patient was  able to perform with no overt losses of balance and denied dizziness.  12/25/2021: FUNCTIONAL OUTCOME MEASURES:  Results Comments  DHI 32/100 Low perception of handicap  ABC Scale 55% Falls risk; in need of intervention  DGI 19/24 Falls risk; in need of intervention  FOTO 52 in need of intervention    PATIENT EDUCATION: Education details: verbal cuing for technique and form throughout session Person educated: Patient Education method: Explanation, Demonstration, and Verbal cues Education comprehension: verbalized understanding and returned demonstration  HOME EXERCISE PROGRAM:  GOALS: Goals reviewed with patient? Yes  SHORT TERM GOALS: Target date: 12/18/2021   Pt will be independent with HEP in order to improve balance and decrease dizziness symptoms in  order to decrease fall risk and improve function at home and work. Baseline: Pt reports independence with HEP 10/23/21 Goal status: MET  2.  Pt will self-report that she has been able to resume prior activities such as going for a walk outside and being able to shop at the grocery store without imbalance. Baseline: pt states she has to hold her husband's hand to be able to walk outside in the woods and reports she used a cart and had difficulty shopping recently due to her symptoms; On 10/30/21, pt reports that she was able to walk around [place] with her friend without support without loss of balance but states she is careful as she still feels imbalance sensation at times.  Goal status: Met  LONG TERM GOALS: Target date: 01/24/2022    Patient will have improved FOTO score of 6 points or greater in order to demonstrate improvements in patient's ADLs and functional performance.  Baseline: 45 on 09/25/21; 52 scored on 12/25/21 Goal status: Met  2.  Patient will demonstrate reduced falls risk as evidenced by Dynamic Gait Index (DGI) >19/24. Baseline: 10/24 on 09/25/21; scored 19/24 on 12/25/2021 Goal status: partially met  3.  Patient will reduce falls risk as indicated by Activities Specific Balance Confidence Scale (ABC) >67%.      Patient will have demonstrate decreased falls risk as indicated by Activities Specific Balance Confidence Scale score of 80% or greater. Baseline: 45.6% on 09/25/21; scored 55% on 12/25/21 Goal status: partially met  4.  Patient will reduce perceived disability to low levels as indicated by <40 on Dizziness Handicap Inventory. Baseline: 42/100 on 09/25/21; scored 32/100 on 12/25/2021 Goal status: met  5.  Patient will report 50% or greater improvement in their symptoms of dizziness and imbalance with provoking motions.  Baseline: at eval, reports walking with head turns and bending over increase symptoms; reports 50% improvement on 12/25/21 Goal  status: met  ASSESSMENT:  CLINICAL IMPRESSION: Patient reports that she had a very good day yesterday but that today she has had mild dizziness feeling of heaviness around her eyes and forehead this date.  Patient reports that this heaviness sensation and her dizziness decreased as the session went on this date.  Patient did well with ball sorting activity and was able to perform without dizziness, but her balance was mildly challenged standing on the red foam floor mat.  Patient did well with forward and retroambulation with head turns and ball toss to self with head and eye follows.  Patient was able to perform all activities this date without dizziness and with mild imbalance but she was able to self-correct.  Patient would benefit from continued vestibular PT services to further address goals and to try to decrease patient's subjective symptoms of dizziness and  imbalance.  OBJECTIVE IMPAIRMENTS: decreased balance, difficulty walking, and dizziness.   ACTIVITY LIMITATIONS: bending, stairs, and locomotion level  PARTICIPATION LIMITATIONS: cleaning, shopping, and walking outside  PERSONAL FACTORS: Time since onset of injury/illness/exacerbation and 1-2 comorbidities: Ramsay Hunt syndrome, hyperlipidemia  are also affecting patient's functional outcome.   REHAB POTENTIAL: Good  CLINICAL DECISION MAKING: Evolving/moderate complexity  EVALUATION COMPLEXITY: Moderate   PLAN:  PT FREQUENCY: 1-2x/week  PT DURATION: 12 weeks  PLANNED INTERVENTIONS: Therapeutic exercises, Therapeutic activity, Neuromuscular re-education, Balance training, Gait training, Patient/Family education, Self Care, Stair training, Vestibular training, and Canalith repositioning  PLAN FOR NEXT SESSION: Forward and retroambulation with vertical and diagonal head turns. Consider nose to knee habituation exercise. Lady Deutscher, PT 01/01/2022, 1:23 PM

## 2022-01-08 ENCOUNTER — Ambulatory Visit: Payer: No Typology Code available for payment source | Admitting: Physical Therapy

## 2022-01-08 ENCOUNTER — Encounter: Payer: Self-pay | Admitting: Physical Therapy

## 2022-01-08 DIAGNOSIS — R42 Dizziness and giddiness: Secondary | ICD-10-CM | POA: Diagnosis not present

## 2022-01-08 DIAGNOSIS — R2681 Unsteadiness on feet: Secondary | ICD-10-CM

## 2022-01-08 NOTE — Therapy (Signed)
OUTPATIENT PHYSICAL THERAPY VESTIBULAR TREATMENT NOTE   Patient Name: Abigail Delacruz MRN: [id number] DOB:1950/05/22, 71 y.o., female Today's Date: 01/08/2022  PCP: Dr. Loura Pardon REFERRING PROVIDER: Dr. Theora Master   PT End of Session - 01/08/22 1109     Visit Number 9    Number of Visits 24    Date for PT Re-Evaluation 02/20/22    PT Start Time 1104    PT Stop Time 1145    PT Time Calculation (min) 41 min    Equipment Utilized During Treatment Gait belt    Activity Tolerance Patient tolerated treatment well    Behavior During Therapy [facility] for tasks assessed/performed             Past Medical History:  Diagnosis Date   Cataract    Hyperlipidemia    Past Surgical History:  Procedure Laterality Date   APPENDECTOMY  1971   CATARACT EXTRACTION W/PHACO Right 09/08/2017   Procedure: CATARACT EXTRACTION PHACO AND INTRAOCULAR LENS PLACEMENT (IOC)  RIGHT;  Surgeon: Nevada Crane, MD;  Location: [facility];  Service: Ophthalmology;  Laterality: Right;   CATARACT EXTRACTION W/PHACO Left 09/29/2017   Procedure: CATARACT EXTRACTION PHACO AND INTRAOCULAR LENS PLACEMENT (IOC) LEFT;  Surgeon: Nevada Crane, MD;  Location: [facility];  Service: Ophthalmology;  Laterality: Left;   COLONOSCOPY WITH PROPOFOL N/A 10/20/2017   Procedure: COLONOSCOPY WITH PROPOFOL;  Surgeon: Midge Minium, MD;  Location: [facility];  Service: Endoscopy;  Laterality: N/A;   Patient Active Problem List   Diagnosis Date Noted   Hepatic cyst 06/20/2021   Elevated alkaline phosphatase level 06/20/2021   Annual physical exam 02/06/2021   Onychomycosis 02/06/2021   Dizziness 12/04/2020   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   Herpes zoster with ophthalmic complication 07/18/2020   Acute actinic otitis externa, left ear 06/30/2020   Facial cellulitis 06/30/2020   Hyperlipidemia    GERD (gastroesophageal reflux disease) 06/26/2020   Hyperlipidemia, mixed 10/01/2017    Chronic left shoulder pain 05/23/2017   ONSET DATE: 06/29/2020  REFERRING DIAG: R42 (ICD-10-CM) - Dizziness  THERAPY DIAG:  Dizziness and giddiness  Unsteadiness on feet  Rationale for Evaluation and Treatment Rehabilitation  SUBJECTIVE:   PERTINENT HISTORY:  Pt states that on Jun 29, 2020 she went to the ER and was found to have Ramsay Hunt 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 Pt states that her face is better. Pt states I have done everything they have told me to do. Pt states she was seen at [facility] for her walking and balance issues and that this did help. Pt states she could not drive in the beginning. Pt states after PT she felt more secure and was able to drive about 4 or 5 months after onset. Pt reports she is now driving without issues. Patient states she has been to see a heart, liver, eye doctor. She was referred from the cardiologist to see Dr/ Willeen Cass. ENT physician. She saw Dr. Willeen Cass yesterday. Pt states they found some hearing loss in the left ear and states the tests for crystals was negative at the ENT office. Pt states she has an appointment with Dr. Malvin Johns tomorrow. Pt states she tried Clonopin, but that made her sleepy and did not help with her symptoms.  Pt states she wants to get better and have less dizziness. Pt states she is having dizziness daily. Pt states when she gets up she feels fine but states when she gets up and starts  walking and moving she gets dizzy. Pt states she touches the wall for balance. Pt states that it is more difficult to walk in the grocery store and states she needs to hold the grocery cart. Patient states that she is not able to go on a walk outside for exercise with her husband unless she were to hold his hand for balance. Pt reports taht she has had 2 MRI brain scans in the past year and that her cranial nerve#7 is still swollen. Pt states her left inner ear still feels swollen.  Per cardiologist's visit note: She was diagnosed with  Ramsay Hunt syndrome May 2022/about 1 year ago.  Patient had facial palsy, and symptoms of dizziness since then.  Left facial palsy has since improved, symptoms of dizziness have persisted.  Dizziness usually or course when she bends over, or while walking.  Denies palpitations, chest pain, shortness of breath, any history of heart disease. Dizziness, orthostatic vitals in the office with no evidence for orthostasis.  Symptoms of dizziness occurred after Ramsay Hunt's/herpes zoster infection.  Symptoms are not consistent with a cardiac etiology of dizziness.     PAIN:  Are you having pain? No  PRECAUTIONS: Fall  WEIGHT BEARING RESTRICTIONS: No  PLOF: Independent, Independent with community mobility without device, Vocation/Vocational requirements: self-employed, and Leisure: travel, swim  PATIENT GOALS: Pt would like to be able to walk better without imbalance, to be able to shop in grocery store without symptoms, be able to swim  SUBJECTIVE STATEMENT: Pt states that her dizziness was less this past week except this morning she has been dizzy.   TODAY'S TREATMENT:  01/08/2022:  Newman Pies toss over shoulder: Patient performed multiple 61' trials of forward ambulation while tossing ball over one shoulder with return catch over opposite shoulder with contact-guard assistance.   Then, repeated while varying the ball position to head, shoulder and waist level to promote head turning and tilting. Patient was able to perform with one step out for balance and one episode of increased trunk sway. Pt reports mild imbalance sensation.  Bounce Passes: Patient performed ambulation 25' trials while doing alternating sides bounce passes to self with ball while tracking ball with eyes and head.  Pt had no loss of balance and reports very mild dizziness with this activity.  Airex balance beam: On Airex balance beam, performed sideways stance narrow base of support static holds with eyes open with and  without horizontal and vertical head trials of 30-60 seconds.  On Airex balance beam, performed sideways stance narrow base of support static holds with eyes closed with and without horizontal and vertical head turns trials of 30-60 seconds.   On Airex balance beam, performed sideways stepping with horizontal head turns 5' times 4 reps. Pt had increased difficulty with all eyes closed activities.   Quick Turns:  Performed 10 reps walking 8' with alternating quick turns left/right.  Patient denies dizziness with this activity and demonstrated no evidence of imbalance.   01/01/2022:  Ambulation with head turns:  Patient performed 175' forwards and retro ambulation while scanning for targets in hallway.  Patient performed 175' trials of forwards random head turns including horizontal, vertical and diagonal with contact-guard assistance. Patient performed multiple 175 feet trials of retro ambulation with horizontal, vertical, diagonal and self-selected head turns with contact guard assistance.   Patient demonstrates mild veering at times and had a few episodes where she would stop for a few seconds before resuming walking with retro ambulation.  Patient denies dizziness  with these activities this date.  Ball Sorting Activity: On firm surface, performed transferring multicolored balls from bin placed on floor to another bin placed 180 degrees on the other side of patient while patient visually tracks the ball so that patient is required to turn 180 degrees Left and Right to turn to bend over and pick and place balls in the bins with guard assistance.  Did well with this activity demonstrating no imbalance and patient denied dizziness.  Then repeated activity while standing on a red foam floor mat.  Patient had mild imbalance but no overt losses of balance with this activity and denies dizziness.  Newman Pies toss to self:  Patient performed ambulation 75' feet forward while tossing ball to self horizontal  and then vertical while tracking ball with head and eyes and then performed 75 feet retrotrials of these activities with contact-guard assistance.  Patient denies dizziness with this activity.   Newman Pies toss over shoulder: Patient performed multiple 4' trials of forward ambulation while tossing ball over one shoulder with return catch over opposite shoulder with contact-guard assistance.  Then repeated while varying the ball position to head, shoulder and waist level to promote head turning and tilting. Patient was able to perform with no overt losses of balance and denied dizziness.  12/25/2021: FUNCTIONAL OUTCOME MEASURES:  Results Comments  DHI 32/100 Low perception of handicap  ABC Scale 55% Falls risk; in need of intervention  DGI 19/24 Falls risk; in need of intervention  FOTO 52 in need of intervention    PATIENT EDUCATION: Education details: verbal cuing for technique and form throughout session Person educated: Patient Education method: Explanation, Demonstration, and Verbal cues Education comprehension: verbalized understanding and returned demonstration  HOME EXERCISE PROGRAM:  GOALS: Goals reviewed with patient? Yes  SHORT TERM GOALS: Target date: 12/18/2021   Pt will be independent with HEP in order to improve balance and decrease dizziness symptoms in order to decrease fall risk and improve function at home and work. Baseline: Pt reports independence with HEP 10/23/21 Goal status: MET  2.  Pt will self-report that she has been able to resume prior activities such as going for a walk outside and being able to shop at the grocery store without imbalance. Baseline: pt states she has to hold her husband's hand to be able to walk outside in the woods and reports she used a cart and had difficulty shopping recently due to her symptoms; On 10/30/21, pt reports that she was able to walk around [place] and [place] with her friend without support without loss of balance but  states she is careful as she still feels imbalance sensation at times.  Goal status: Met  LONG TERM GOALS: Target date: 01/24/2022    Patient will have improved FOTO score of 6 points or greater in order to demonstrate improvements in patient's ADLs and functional performance.  Baseline: 45 on 09/25/21; 52 scored on 12/25/21 Goal status: Met  2.  Patient will demonstrate reduced falls risk as evidenced by Dynamic Gait Index (DGI) >19/24. Baseline: 10/24 on 09/25/21; scored 19/24 on 12/25/2021 Goal status: partially met  3.  Patient will reduce falls risk as indicated by Activities Specific Balance Confidence Scale (ABC) >67%.      Patient will have demonstrate decreased falls risk as indicated by Activities Specific Balance Confidence Scale score of 80% or greater. Baseline: 45.6% on 09/25/21; scored 55% on 12/25/21 Goal status: partially met  4.  Patient will reduce perceived disability to low levels  as indicated by <40 on Dizziness Handicap Inventory. Baseline: 42/100 on 09/25/21; scored 32/100 on 12/25/2021 Goal status: met  5.  Patient will report 50% or greater improvement in their symptoms of dizziness and imbalance with provoking motions.  Baseline: at eval, reports walking with head turns and bending over increase symptoms; reports 50% improvement on 12/25/21 Goal status: met  ASSESSMENT:  CLINICAL IMPRESSION: Patient did well with walking with quick turns and was able to perform without dizziness and no loss of balance. Patient had mild imbalance with ambulation with ball toss over shoulder and had one episode where she need to take a step out to regain her balance. Patient balance was challenged by eyes closed static stance on Airex balance beam trials as well as static stance with head turns with eyes open. Patient would benefit from continued PT services to further address patient's symptoms of imbalance and dizziness and to try to further improve patient's balance.   OBJECTIVE  IMPAIRMENTS: decreased balance, difficulty walking, and dizziness.   ACTIVITY LIMITATIONS: bending, stairs, and locomotion level  PARTICIPATION LIMITATIONS: cleaning, shopping, and walking outside  PERSONAL FACTORS: Time since onset of injury/illness/exacerbation and 1-2 comorbidities: Ramsay Hunt syndrome, hyperlipidemia  are also affecting patient's functional outcome.   REHAB POTENTIAL: Good  CLINICAL DECISION MAKING: Evolving/moderate complexity  EVALUATION COMPLEXITY: Moderate   PLAN:  PT FREQUENCY: 1-2x/week  PT DURATION: 12 weeks  PLANNED INTERVENTIONS: Therapeutic exercises, Therapeutic activity, Neuromuscular re-education, Balance training, Gait training, Patient/Family education, Self Care, Stair training, Vestibular training, and Canalith repositioning  PLAN FOR NEXT SESSION: ambulation in busy environment with head turns and progressions of high level balance activities.    Mardelle Matte, PT 01/08/2022, 11:46 AM

## 2022-01-09 ENCOUNTER — Ambulatory Visit
Admission: RE | Admit: 2022-01-09 | Discharge: 2022-01-09 | Disposition: A | Discharge status: Home / Self Care | Payer: No Typology Code available for payment source | Source: Ambulatory Visit | Attending: Physician Assistant | Admitting: Physician Assistant

## 2022-01-09 DIAGNOSIS — Z1231 Encounter for screening mammogram for malignant neoplasm of breast: Secondary | ICD-10-CM | POA: Insufficient documentation

## 2022-01-15 ENCOUNTER — Encounter: Payer: Self-pay | Admitting: Physical Therapy

## 2022-01-15 ENCOUNTER — Ambulatory Visit: Payer: No Typology Code available for payment source | Admitting: Physical Therapy

## 2022-01-15 DIAGNOSIS — R42 Dizziness and giddiness: Secondary | ICD-10-CM

## 2022-01-15 DIAGNOSIS — R2681 Unsteadiness on feet: Secondary | ICD-10-CM

## 2022-01-15 NOTE — Therapy (Signed)
OUTPATIENT PHYSICAL THERAPY VESTIBULAR TREATMENT NOTE/PROGRESS NOTE   Patient Name: Abigail Delacruz MRN: [id number] DOB:[birth year], 71 y.o., female Today's Date: 01/15/2022  PCP: Dr. Charlynne Cousins REFERRING PROVIDER: Dr. Gurney Maxin   PT End of Session - 01/15/22 1051     Visit Number 10    Number of Visits 24    Date for PT Re-Evaluation 02/20/22    PT Start Time 1052    PT Stop Time 1135    PT Time Calculation (min) 43 min    Equipment Utilized During Treatment Gait belt    Activity Tolerance Patient tolerated treatment well    Behavior During Therapy WFL for tasks assessed/performed             Past Medical History:  Diagnosis Date   Cataract    Hyperlipidemia    Past Surgical History:  Procedure Laterality Date   APPENDECTOMY  1971   CATARACT EXTRACTION W/PHACO Right 09/08/2017   Procedure: CATARACT EXTRACTION PHACO AND INTRAOCULAR LENS PLACEMENT ([place])  RIGHT;  Surgeon: Eulogio Bear, MD;  Location: [place];  Service: Ophthalmology;  Laterality: Right;   CATARACT EXTRACTION W/PHACO Left 09/29/2017   Procedure: CATARACT EXTRACTION PHACO AND INTRAOCULAR LENS PLACEMENT ([place]) LEFT;  Surgeon: Eulogio Bear, MD;  Location: [place];  Service: Ophthalmology;  Laterality: Left;   COLONOSCOPY WITH PROPOFOL N/A 10/20/2017   Procedure: COLONOSCOPY WITH PROPOFOL;  Surgeon: Lucilla Lame, MD;  Location: [place];  Service: Endoscopy;  Laterality: N/A;   Patient Active Problem List   Diagnosis Date Noted   Hepatic cyst 06/20/2021   Elevated alkaline phosphatase level 06/20/2021   Annual physical exam 02/06/2021   Onychomycosis 02/06/2021   Dizziness 12/04/2020   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   Herpes zoster with ophthalmic complication 23/76/2831   Acute actinic otitis externa, left ear 06/30/2020   Facial cellulitis 06/30/2020   Hyperlipidemia    GERD (gastroesophageal reflux disease) 06/26/2020   Hyperlipidemia,  mixed 10/01/2017   Chronic left shoulder pain 05/23/2017   ONSET DATE: 06/29/2020  REFERRING DIAG: R42 (ICD-10-CM) - Dizziness  THERAPY DIAG:  Unsteadiness on feet  Dizziness and giddiness  Rationale for Evaluation and Treatment Rehabilitation  SUBJECTIVE:   PERTINENT HISTORY:  Pt states that on Jun 29, 2020 she went to the ER and was found to have Ramsay Hunt 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 Pt states that her face is better. Pt states I have done everything they have told me to do. Pt states she was seen at [facility] for her walking and balance issues and that this did help. Pt states she could not drive in the beginning. Pt states after PT she felt more secure and was able to drive about 4 or 5 months after onset. Pt reports she is now driving without issues. Patient states she has been to see a heart, liver, eye doctor. She was referred from the cardiologist to see Dr/ Richardson Landry. ENT physician. She saw Dr. Richardson Landry yesterday. Pt states they found some hearing loss in the left ear and states the tests for crystals was negative at the ENT office. Pt states she has an appointment with Dr. Melrose Nakayama tomorrow. Pt states she tried Clonopin, but that made her sleepy and did not help with her symptoms.  Pt states she wants to get better and have less dizziness. Pt states she is having dizziness daily. Pt states when she gets up she feels fine but states when she gets up and  starts walking and moving she gets dizzy. Pt states she touches the wall for balance. Pt states that it is more difficult to walk in the grocery store and states she needs to hold the grocery cart. Patient states that she is not able to go on a walk outside for exercise with her husband unless she were to hold his hand for balance. Pt reports taht she has had 2 MRI brain scans in the past year and that her cranial nerve#7 is still swollen. Pt states her left inner ear still feels swollen.  Per cardiologist's visit note: She  was diagnosed with Ramsay Hunt syndrome May 2022/about 1 year ago.  Patient had facial palsy, and symptoms of dizziness since then.  Left facial palsy has since improved, symptoms of dizziness have persisted.  Dizziness usually or course when she bends over, or while walking.  Denies palpitations, chest pain, shortness of breath, any history of heart disease. Dizziness, orthostatic vitals in the office with no evidence for orthostasis.  Symptoms of dizziness occurred after Ramsay Hunt's/herpes zoster infection.  Symptoms are not consistent with a cardiac etiology of dizziness.     PAIN:  Are you having pain? No  PRECAUTIONS: Fall  WEIGHT BEARING RESTRICTIONS: No  PLOF: Independent, Independent with community mobility without device, Vocation/Vocational requirements: self-employed, and Leisure: travel, swim  PATIENT GOALS: Pt would like to be able to walk better without imbalance, to be able to shop in grocery store without symptoms, be able to swim  SUBJECTIVE STATEMENT: Patient states that she had some good days but states she stayed home all day on Sunday because she was dizzy. Pt states she feels she is getting better overall though.   TODAY'S TREATMENT:  01/15/2022: Performed ABC scale, DGI and FOTO outcome measures.   [facility] PT Assessment - 01/15/22 1111       Dynamic Gait Index   Level Surface Normal    Change in Gait Speed Normal    Gait with Horizontal Head Turns Mild Impairment    Gait with Vertical Head Turns Normal    Gait and Pivot Turn Normal    Step Over Obstacle Mild Impairment    Step Around Obstacles Normal    Steps Mild Impairment    Total Score 21           FUNCTIONAL OUTCOME MEASURES:  Results Comments  ABC Scale 65% Falls risk; in need of intervention  DGI 21/24 falls risk; in need of intervention  FOTO 66 Like patients nationally had a score of /100; in need of intervention   Rockerboard: On medium wooden rocker board, worked on side to side and  anterior/posterior sways with and without horizontal and vertical head turns with faded upper extremity support with contact guard assistance and patient reached for ballet bar for support several times due to imbalance.  Pt's balance challenged by activities on board but denies dizziness with this activity.   2" X 4" board:   On 2" X 4" board worked on sidestepping left/right with and without horizontal head turns 8' times multiple reps of each type.  Patient had difficulty with maintaining balance with sidestepping with head turns. Patient reported dizziness with these activities.  Patient required CGA with activities on 2 X 4" board.   11/14/2023Diona Foley toss over shoulder: Patient performed multiple 63' trials of forward ambulation while tossing ball over one shoulder with return catch over opposite shoulder with contact-guard assistance.   Then, repeated while varying the ball position to head,  shoulder and waist level to promote head turning and tilting. Patient was able to perform with one step out for balance and one episode of increased trunk sway. Pt reports mild imbalance sensation.  Bounce Passes: Patient performed ambulation 72' trials while doing alternating sides bounce passes to self with ball while tracking ball with eyes and head.  Pt had no loss of balance and reports very mild dizziness with this activity.  Airex balance beam: On Airex balance beam, performed sideways stance narrow base of support static holds with eyes open with and without horizontal and vertical head trials of 30-60 seconds.  On Airex balance beam, performed sideways stance narrow base of support static holds with eyes closed with and without horizontal and vertical head turns trials of 30-60 seconds.   On Airex balance beam, performed sideways stepping with horizontal head turns 5' times 4 reps. Pt had increased difficulty with all eyes closed activities.   Quick Turns:  Performed 10 reps walking 8'  with alternating quick turns left/right.  Patient denies dizziness with this activity and demonstrated no evidence of imbalance.   PATIENT EDUCATION: Education details: verbal cuing for technique and form throughout session; discussed functional outcome testing and goals Person educated: Patient Education method: Explanation, Demonstration, and Verbal cues Education comprehension: verbalized understanding and returned demonstration  HOME EXERCISE PROGRAM:  GOALS: Goals reviewed with patient? Yes  SHORT TERM GOALS: Target date: 12/18/2021   Pt will be independent with HEP in order to improve balance and decrease dizziness symptoms in order to decrease fall risk and improve function at home and work. Baseline: Pt reports independence with HEP 10/23/21 Goal status: MET  2.  Pt will self-report that she has been able to resume prior activities such as going for a walk outside and being able to shop at the grocery store without imbalance. Baseline: pt states she has to hold her husband's hand to be able to walk outside in the woods and reports she used a cart and had difficulty shopping recently due to her symptoms; On 10/30/21, pt reports that she was able to walk around [place] and [place] with her friend without support without loss of balance but states she is careful as she still feels imbalance sensation at times.  Goal status: Met  LONG TERM GOALS: Target date: 01/24/2022    Patient will have improved FOTO score of 6 points or greater in order to demonstrate improvements in patient's ADLs and functional performance.  Baseline: 45 on 09/25/21; 52 scored on 12/25/21; scored 66 on 01/15/2022 Goal status: Met  2.  Patient will demonstrate reduced falls risk as evidenced by Dynamic Gait Index (DGI) >19/24. Baseline: 10/24 on 09/25/21; scored 19/24 on 12/25/2021; scored 21/24 on 01/15/22. Goal status: Met  3.  Patient will reduce falls risk as indicated by Activities Specific Balance  Confidence Scale (ABC) >67%. Baseline: 45.6% on 09/25/21; scored 55% on 12/25/21; scored 65% on 01/15/2022 Goal status: partially met  4.  Patient will reduce perceived disability to low levels as indicated by <40 on Dizziness Handicap Inventory. Baseline: 42/100 on 09/25/21; scored 32/100 on 12/25/2021 Goal status: met  5.  Patient will report 50% or greater improvement in their symptoms of dizziness and imbalance with provoking motions.  Baseline: at eval, reports walking with head turns and bending over increase symptoms; reports 50% improvement on 12/25/21 Goal status: met  ASSESSMENT:  CLINICAL IMPRESSION: Patient reports that she feels she is improving overall with her symptoms of dizziness and imbalance.  Repeated functional outcome measure testing this date and patient improved from 45 to 66 on FOTO indicating improvement in patient's overall daily activities. Pt improved from 10/24 to 21/24 on the dynamic gait index indicating marked improvement in her balance. Pt also improved from 45% to 65% on the ABC scale indicating good improvement in patient's self-report of her balance skills. Pt has met 1/1 short term goals and 4/5 long term goals. Pt has partially met remaining long term goal of improving ABC scale to greater than 67%. Pt challenged by high level balance tasks this date on 2" x 4" board and rockerboard. Patient would benefit from continued PT services to further address patient's symptoms of imbalance and dizziness and to try to further improve patient's balance.   OBJECTIVE IMPAIRMENTS: decreased balance, difficulty walking, and dizziness.   ACTIVITY LIMITATIONS: bending, stairs, and locomotion level  PARTICIPATION LIMITATIONS: cleaning, shopping, and walking outside  PERSONAL FACTORS: Time since onset of injury/illness/exacerbation and 1-2 comorbidities: Ramsay Hunt syndrome, hyperlipidemia  are also affecting patient's functional outcome.   REHAB POTENTIAL: Good  CLINICAL  DECISION MAKING: Evolving/moderate complexity  EVALUATION COMPLEXITY: Moderate   PLAN:  PT FREQUENCY: 1-2x/week  PT DURATION: 12 weeks  PLANNED INTERVENTIONS: Therapeutic exercises, Therapeutic activity, Neuromuscular re-education, Balance training, Gait training, Patient/Family education, Self Care, Stair training, Vestibular training, and Canalith repositioning  PLAN FOR NEXT SESSION: ambulation in busy environment with head turns, 2 X 4 board and rockerboard activities with head turns.    Lady Deutscher, PT 01/15/2022, 2:42 PM

## 2022-01-22 ENCOUNTER — Encounter: Payer: No Typology Code available for payment source | Admitting: Physical Therapy

## 2022-01-29 ENCOUNTER — Encounter: Payer: No Typology Code available for payment source | Admitting: Physical Therapy

## 2022-02-05 ENCOUNTER — Ambulatory Visit: Payer: No Typology Code available for payment source | Attending: Neurology | Admitting: Physical Therapy

## 2022-02-05 ENCOUNTER — Encounter: Payer: Self-pay | Admitting: Physical Therapy

## 2022-02-05 DIAGNOSIS — R2681 Unsteadiness on feet: Secondary | ICD-10-CM

## 2022-02-05 DIAGNOSIS — R42 Dizziness and giddiness: Secondary | ICD-10-CM

## 2022-02-05 NOTE — Therapy (Signed)
OUTPATIENT PHYSICAL THERAPY VESTIBULAR TREATMENT NOTE Patient Name: Abigail Delacruz MRN: [id number] DOB:[birth year], 71 y.o., female Today's Date: 02/05/2022  PCP: Dr. Charlynne Cousins REFERRING PROVIDER: Dr. Gurney Maxin   PT End of Session - 02/05/22 1100     Visit Number 11    Number of Visits 24    Date for PT Re-Evaluation 02/20/22    PT Start Time 1100    PT Stop Time 1146    PT Time Calculation (min) 46 min    Equipment Utilized During Treatment Gait belt    Activity Tolerance Patient tolerated treatment well    Behavior During Therapy [facility] for tasks assessed/performed            Past Medical History:  Diagnosis Date   Cataract    Hyperlipidemia    Past Surgical History:  Procedure Laterality Date   APPENDECTOMY  1971   CATARACT EXTRACTION W/PHACO Right 09/08/2017   Procedure: CATARACT EXTRACTION PHACO AND INTRAOCULAR LENS PLACEMENT ([place])  RIGHT;  Surgeon: Eulogio Bear, MD;  Location: [place];  Service: Ophthalmology;  Laterality: Right;   CATARACT EXTRACTION W/PHACO Left 09/29/2017   Procedure: CATARACT EXTRACTION PHACO AND INTRAOCULAR LENS PLACEMENT ([place]) LEFT;  Surgeon: Eulogio Bear, MD;  Location: [place];  Service: Ophthalmology;  Laterality: Left;   COLONOSCOPY WITH PROPOFOL N/A 10/20/2017   Procedure: COLONOSCOPY WITH PROPOFOL;  Surgeon: Lucilla Lame, MD;  Location: [place];  Service: Endoscopy;  Laterality: N/A;   Patient Active Problem List   Diagnosis Date Noted   Hepatic cyst 06/20/2021   Elevated alkaline phosphatase level 06/20/2021   Annual physical exam 02/06/2021   Onychomycosis 02/06/2021   Dizziness 12/04/2020   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   Herpes zoster with ophthalmic complication 52/84/1324   Acute actinic otitis externa, left ear 06/30/2020   Facial cellulitis 06/30/2020   Hyperlipidemia    GERD (gastroesophageal reflux disease) 06/26/2020   Hyperlipidemia, mixed 10/01/2017    Chronic left shoulder pain 05/23/2017   ONSET DATE: 06/29/2020  REFERRING DIAG: R42 (ICD-10-CM) - Dizziness  THERAPY DIAG:  Unsteadiness on feet  Dizziness and giddiness  Rationale for Evaluation and Treatment Rehabilitation  SUBJECTIVE:   PERTINENT HISTORY:  Pt states that on Jun 29, 2020 she went to the ER and was found to have Ramsay Hunt 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 Pt states that her face is better. Pt states I have done everything they have told me to do. Pt states she was seen at [facility] for her walking and balance issues and that this did help. Pt states she could not drive in the beginning. Pt states after PT she felt more secure and was able to drive about 4 or 5 months after onset. Pt reports she is now driving without issues. Patient states she has been to see a heart, liver, eye doctor. She was referred from the cardiologist to see Dr/ Richardson Landry. ENT physician. She saw Dr. Richardson Landry yesterday. Pt states they found some hearing loss in the left ear and states the tests for crystals was negative at the ENT office. Pt states she has an appointment with Dr. Melrose Nakayama tomorrow. Pt states she tried Clonopin, but that made her sleepy and did not help with her symptoms.  Pt states she wants to get better and have less dizziness. Pt states she is having dizziness daily. Pt states when she gets up she feels fine but states when she gets up and starts walking and moving  she gets dizzy. Pt states she touches the wall for balance. Pt states that it is more difficult to walk in the grocery store and states she needs to hold the grocery cart. Patient states that she is not able to go on a walk outside for exercise with her husband unless she were to hold his hand for balance. Pt reports taht she has had 2 MRI brain scans in the past year and that her cranial nerve#7 is still swollen. Pt states her left inner ear still feels swollen.  Per cardiologist's visit note: She was diagnosed with  Ramsay Hunt syndrome May 2022/about 1 year ago.  Patient had facial palsy, and symptoms of dizziness since then.  Left facial palsy has since improved, symptoms of dizziness have persisted.  Dizziness usually or course when she bends over, or while walking.  Denies palpitations, chest pain, shortness of breath, any history of heart disease. Dizziness, orthostatic vitals in the office with no evidence for orthostasis.  Symptoms of dizziness occurred after Ramsay Hunt's/herpes zoster infection.  Symptoms are not consistent with a cardiac etiology of dizziness.     PAIN:  Are you having pain? No  PRECAUTIONS: Fall  WEIGHT BEARING RESTRICTIONS: No  PLOF: Independent, Independent with community mobility without device, Vocation/Vocational requirements: self-employed, and Leisure: travel, swim  PATIENT GOALS: Pt would like to be able to walk better without imbalance, to be able to shop in grocery store without symptoms, be able to swim  SUBJECTIVE STATEMENT: Patient states that she had a good trip and cruise to [place]; Pt states she had some dizziness on the cruise but states it was "nothing dramatic". Pt states she just heard bad news about a family member today and she noticed that she started to get dizziness. Discussed how stress can be a contributing factor to dizziness.   TODAY'S TREATMENT:  02/05/2022: Pt states she just heard bad news about a family member today and she noticed that she started to get dizziness. Discussed how stress can be a contributing factor to dizziness.  In addition, patient reports that she has not been sleeping well. Discussed free Calm sleep app and other sleep strategies as decreased sleep and increased stress can impact dizziness symptoms.  2" X 4" board:   On 2" X 4" board worked on static sideways stance with head turns and then worked on sidestepping left/right with and without horizontal and vertical head turns 8' times multiple reps of each type.  Patient  required CGA with activities on 2 X 4" board.  Patient had the difficulty with maintaining balance with increased challenge when added vertical head turns.  Ambulation with head turns:  Patient performed 150' trials of forwards and retro ambulation with horizontal, vertical and diagonal head turns with CGA.   Body Wall Rolls:  Patient performed 2 reps of supported, body wall rolls with eyes closed and 4 reps of unsupported, body wall rolls with eyes closed with contact guard assistance.  Patient reports slight lightheadedness, dizziness with this activity upon stopping.   Diona Foley toss over shoulder: Patient performed multiple 150' trials of forward ambulation while tossing ball over one shoulder with return catch over opposite shoulder with contact-guard assistance.   Then, repeated while varying the ball position to head, shoulder and waist level to promote head turning and tilting. Patient was able to perform with improved balance this date.   01/15/2022: Performed ABC scale, DGI and FOTO outcome measures.  FUNCTIONAL OUTCOME MEASURES:  Results Comments  ABC Scale 65%  Falls risk; in need of intervention  DGI 21/24 falls risk; in need of intervention  FOTO 66 Like patients nationally had a score of /100; in need of intervention   Rockerboard: On medium wooden rocker board, worked on side to side and anterior/posterior sways with and without horizontal and vertical head turns with faded upper extremity support with contact guard assistance and patient reached for ballet bar for support several times due to imbalance.  Pt's balance challenged by activities on board but denies dizziness with this activity.   2" X 4" board:   On 2" X 4" board worked on sidestepping left/right with and without horizontal head turns 8' times multiple reps of each type.  Patient had difficulty with maintaining balance with sidestepping with head turns. Patient reported dizziness with these activities.   Patient required CGA with activities on 2 X 4" board.  PATIENT EDUCATION: Education details: verbal cuing for technique and form throughout session; discussed functional outcome testing and goals Person educated: Patient Education method: Explanation, Demonstration, and Verbal cues Education comprehension: verbalized understanding and returned demonstration  HOME EXERCISE PROGRAM:  GOALS: Goals reviewed with patient? Yes  SHORT TERM GOALS: Target date: 12/18/2021   Pt will be independent with HEP in order to improve balance and decrease dizziness symptoms in order to decrease fall risk and improve function at home and work. Baseline: Pt reports independence with HEP 10/23/21 Goal status: MET  2.  Pt will self-report that she has been able to resume prior activities such as going for a walk outside and being able to shop at the grocery store without imbalance. Baseline: pt states she has to hold her husband's hand to be able to walk outside in the woods and reports she used a cart and had difficulty shopping recently due to her symptoms; On 10/30/21, pt reports that she was able to walk around [place] and [place] with her friend without support without loss of balance but states she is careful as she still feels imbalance sensation at times.  Goal status: Met  LONG TERM GOALS: Target date: 01/24/2022    Patient will have improved FOTO score of 6 points or greater in order to demonstrate improvements in patient's ADLs and functional performance.  Baseline: 45 on 09/25/21; 52 scored on 12/25/21; scored 66 on 01/15/2022 Goal status: Met  2.  Patient will demonstrate reduced falls risk as evidenced by Dynamic Gait Index (DGI) >19/24. Baseline: 10/24 on 09/25/21; scored 19/24 on 12/25/2021; scored 21/24 on 01/15/22. Goal status: Met  3.  Patient will reduce falls risk as indicated by Activities Specific Balance Confidence Scale (ABC) >67%. Baseline: 45.6% on 09/25/21; scored 55% on  12/25/21; scored 65% on 01/15/2022 Goal status: partially met  4.  Patient will reduce perceived disability to low levels as indicated by <40 on Dizziness Handicap Inventory. Baseline: 42/100 on 09/25/21; scored 32/100 on 12/25/2021 Goal status: met  5.  Patient will report 50% or greater improvement in their symptoms of dizziness and imbalance with provoking motions.  Baseline: at eval, reports walking with head turns and bending over increase symptoms; reports 50% improvement on 12/25/21 Goal status: met  ASSESSMENT:  CLINICAL IMPRESSION: Patient reports that she did well on her vacation overall in regard to her dizziness symptoms. Patient reports that she had increased stress today and that it brought on dizziness symptoms. Discussed that stress can impact dizziness levels as well as sleep. Pt did better with activities on 2" x 4" board this date and  was most challenged by vertical head turns. Patient able to progress to unsupported body wall rolls with eyes closed this date with this activity provoking mild symptoms only upon stopping. Patient noted to have some increased posterior sway when stopping between trials of ambulation with head turning activities and would benefit from continued practice. Patient would benefit from continued PT services to further address patient's symptoms of imbalance and dizziness and to address goals  OBJECTIVE IMPAIRMENTS: decreased balance, difficulty walking, and dizziness.   ACTIVITY LIMITATIONS: bending, stairs, and locomotion level  PARTICIPATION LIMITATIONS: cleaning, shopping, and walking outside  PERSONAL FACTORS: Time since onset of injury/illness/exacerbation and 1-2 comorbidities: Ramsay Hunt syndrome, hyperlipidemia  are also affecting patient's functional outcome.   REHAB POTENTIAL: Good  CLINICAL DECISION MAKING: Evolving/moderate complexity  EVALUATION COMPLEXITY: Moderate   PLAN:  PT FREQUENCY: 1-2x/week  PT DURATION: 12  weeks  PLANNED INTERVENTIONS: Therapeutic exercises, Therapeutic activity, Neuromuscular re-education, Balance training, Gait training, Patient/Family education, Self Care, Stair training, Vestibular training, and Canalith repositioning  PLAN FOR NEXT SESSION: ambulation in busy environment with head turns, rockerboard activities with head turns.    Lady Deutscher, PT 02/05/2022, 11:47 AM

## 2022-02-06 NOTE — Progress Notes (Signed)
BP 138/85   Pulse 92   Temp 98.4 F (36.9 C) (Oral)   Ht _0  (1.6 m)   Wt 172 lb 3.2 oz (78.1 kg)   SpO2 95%   BMI 30.50 kg/m    Subjective:    Patient ID: Abigail Delacruz, female    DOB: 24-Apr-1950, 71 y.o.   MRN: [id number]  HPI: Abigail Delacruz is a 71 y.o. female presenting on 02/08/2022 for comprehensive medical examination. Current medical complaints include: dizziness and seeing vestibular  She currently lives with: Menopausal Symptoms: no  HYPERLIPIDEMIA Hyperlipidemia status: excellent compliance Satisfied with current treatment?  yes Side effects:  no Medication compliance: excellent compliance Past cholesterol meds: atorvastain (lipitor) Supplements: none Aspirin:  no The 10-year ASCVD risk score (Arnett DK, et al., 2019) is: 11.5%   Values used to calculate the score:     Age: 49 years     Sex: Female     Is Non-Hispanic African American: No     Diabetic: No     Tobacco smoker: No     Systolic Blood Pressure: 267 mmHg     Is BP treated: No     HDL Cholesterol: 48 mg/dL     Total Cholesterol: 148 mg/dL Chest pain:  no Coronary artery disease:  no Family history CAD:  no Family history early CAD:  no   Depression Screen done today and results listed below:     02/08/2022    8:48 AM 11/29/2021    1:56 PM 10/22/2021    3:59 PM 08/30/2021   12:45 PM 08/01/2021    9:03 AM  Depression screen PHQ 2/9  Decreased Interest 0 0 0 0 0  Down, Depressed, Hopeless 0 0 0 0 0  PHQ - 2 Score 0 0 0 0 0  Altered sleeping 0 0 1 0 0  Tired, decreased energy 0 1 2  0  Change in appetite 0 0 1 0 2  Feeling bad or failure about yourself  0 0 0 0 0  Trouble concentrating 0 0 0 0 0  Moving slowly or fidgety/restless 0 0 1 0 0  Suicidal thoughts 0 0 0 0 0  PHQ-9 Score 0 1 5 0 2  Difficult doing work/chores Not difficult at all   Not difficult at all Not difficult at all    The patient does not have a history of falls. I did complete a risk assessment for falls. A  plan of care for falls was documented.   Past Medical History:  Past Medical History:  Diagnosis Date   Cataract    Hyperlipidemia     Surgical History:  Past Surgical History:  Procedure Laterality Date   APPENDECTOMY  1971   CATARACT EXTRACTION W/PHACO Right 09/08/2017   Procedure: CATARACT EXTRACTION PHACO AND INTRAOCULAR LENS PLACEMENT ([place])  RIGHT;  Surgeon: Eulogio Bear, MD;  Location: [place];  Service: Ophthalmology;  Laterality: Right;   CATARACT EXTRACTION W/PHACO Left 09/29/2017   Procedure: CATARACT EXTRACTION PHACO AND INTRAOCULAR LENS PLACEMENT ([place]) LEFT;  Surgeon: Eulogio Bear, MD;  Location: [place];  Service: Ophthalmology;  Laterality: Left;   COLONOSCOPY WITH PROPOFOL N/A 10/20/2017   Procedure: COLONOSCOPY WITH PROPOFOL;  Surgeon: Lucilla Lame, MD;  Location: [place];  Service: Endoscopy;  Laterality: N/A;    Medications:  Current Outpatient Medications on File Prior to Visit  Medication Sig   albuterol (VENTOLIN HFA) 108 (90 Base) MCG/ACT inhaler Inhale 2 puffs into  the lungs every 6 (six) hours as needed for wheezing or shortness of breath.   atorvastatin (LIPITOR) 40 MG tablet Take 1 tablet (40 mg total) by mouth daily.   CALCIUM-MAGNESIUM-ZINC PO Take 1 tablet by mouth daily.   Cholecalciferol (VITAMIN D3) 1.25 MG (50000 UT) TABS Take by mouth.   Multiple Vitamins-Minerals (MULTIVITAMIN ADULT) CHEW Chew by mouth.   Spacer/Aero-Holding Chambers DEVI 1 Units by Does not apply route as needed.   No current facility-administered medications on file prior to visit.    Allergies:  Allergies  Allergen Reactions   Ciprodex [Ciprofloxacin-Dexamethasone]     Pt states that this ear drop she feels caused her swelling.     Social History:  Social History   Socioeconomic History   Marital status: Married    Spouse name: Not on file   Number of children: 2   Years of education: Not on file   Highest education  level: Some college, no degree  Occupational History   Not on file  Tobacco Use   Smoking status: Former    Types: Cigarettes    Quit date: 1973    Years since quitting: 50.9   Smokeless tobacco: Never  Vaping Use   Vaping Use: Never used  Substance and Sexual Activity   Alcohol use: Yes    Comment: social 1x/yr   Drug use: Never   Sexual activity: Not Currently  Other Topics Concern   Not on file  Social History Narrative   Not on file   Social Determinants of Health   Financial Resource Strain: Low Risk  (08/30/2021)   Overall Financial Resource Strain (CARDIA)    Difficulty of Paying Living Expenses: Not hard at all  Food Insecurity: No Food Insecurity (08/30/2021)   Hunger Vital Sign    Worried About Running Out of Food in the Last Year: Never true    [place] in the Last Year: Never true  Transportation Needs: No Transportation Needs (08/30/2021)   PRAPARE - Hydrologist (Medical): No    Lack of Transportation (Non-Medical): No  Physical Activity: Inactive (08/07/2020)   Exercise Vital Sign    Days of Exercise per Week: 0 days    Minutes of Exercise per Session: 0 min  Stress: No Stress Concern Present (08/30/2021)   [place]    Feeling of Stress : Not at all  Social Connections: Moderately Integrated (08/30/2021)   Social Connection and Isolation Panel [NHANES]    Frequency of Communication with Friends and Family: Not on file    Frequency of Social Gatherings with Friends and Family: Three times a week    Attends Religious Services: More than 4 times per year    Active Member of Clubs or Organizations: No    Attends Archivist Meetings: Not on file    Marital Status: Married  Intimate Partner Violence: Not At Risk (08/30/2021)   Humiliation, Afraid, Rape, and Kick questionnaire    Fear of Current or Ex-Partner: No    Emotionally Abused: No    Physically  Abused: No    Sexually Abused: No   Social History   Tobacco Use  Smoking Status Former   Types: Cigarettes   Quit date: 1973   Years since quitting: 50.9  Smokeless Tobacco Never   Social History   Substance and Sexual Activity  Alcohol Use Yes   Comment: social 1x/yr    Family History:  Family History  Problem Relation Age of Onset   Kidney failure Mother    Cirrhosis Father    Alcohol abuse Father    Diabetes Brother    Hypertension Brother    Arthritis Brother    Breast cancer Neg Hx     Past medical history, surgical history, medications, allergies, family history and social history reviewed with patient today and changes made to appropriate areas of the chart.   Review of Systems  Eyes:  Negative for blurred vision and double vision.  Respiratory:  Negative for shortness of breath.   Cardiovascular:  Negative for chest pain, palpitations and leg swelling.  Neurological:  Negative for dizziness and headaches.   All other ROS negative except what is listed above and in the HPI.      Objective:    BP 138/85   Pulse 92   Temp 98.4 F (36.9 C) (Oral)   Ht _0  (1.6 m)   Wt 172 lb 3.2 oz (78.1 kg)   SpO2 95%   BMI 30.50 kg/m   Wt Readings from Last 3 Encounters:  02/08/22 172 lb 3.2 oz (78.1 kg)  12/18/21 174 lb 9.6 oz (79.2 kg)  11/29/21 184 lb 9.6 oz (83.7 kg)    Physical Exam Vitals and nursing note reviewed.  Constitutional:      General: She is awake. She is not in acute distress.    Appearance: Normal appearance. She is well-developed. She is not ill-appearing.  HENT:     Head: Normocephalic and atraumatic.     Right Ear: Hearing, tympanic membrane, ear canal and external ear normal. No drainage.     Left Ear: Hearing, tympanic membrane, ear canal and external ear normal. No drainage.     Nose: Nose normal.     Right Sinus: No maxillary sinus tenderness or frontal sinus tenderness.     Left Sinus: No maxillary sinus tenderness or frontal  sinus tenderness.     Mouth/Throat:     Mouth: Mucous membranes are moist.     Pharynx: Oropharynx is clear. Uvula midline. No pharyngeal swelling, oropharyngeal exudate or posterior oropharyngeal erythema.  Eyes:     General: Lids are normal.        Right eye: No discharge.        Left eye: No discharge.     Extraocular Movements: Extraocular movements intact.     Conjunctiva/sclera: Conjunctivae normal.     Pupils: Pupils are equal, round, and reactive to light.     Visual Fields: Right eye visual fields normal and left eye visual fields normal.  Neck:     Thyroid: No thyromegaly.     Vascular: No carotid bruit.     Trachea: Trachea normal.  Cardiovascular:     Rate and Rhythm: Normal rate and regular rhythm.     Heart sounds: Normal heart sounds. No murmur heard.    No gallop.  Pulmonary:     Effort: Pulmonary effort is normal. No accessory muscle usage or respiratory distress.     Breath sounds: Normal breath sounds.  Chest:  Breasts:    Right: Normal.     Left: Normal.  Abdominal:     General: Bowel sounds are normal.     Palpations: Abdomen is soft. There is no hepatomegaly or splenomegaly.     Tenderness: There is no abdominal tenderness.  Musculoskeletal:        General: Normal range of motion.     Cervical back: Normal range of motion  and neck supple.     Right lower leg: No edema.     Left lower leg: No edema.  Lymphadenopathy:     Head:     Right side of head: No submental, submandibular, tonsillar, preauricular or posterior auricular adenopathy.     Left side of head: No submental, submandibular, tonsillar, preauricular or posterior auricular adenopathy.     Cervical: No cervical adenopathy.     Upper Body:     Right upper body: No supraclavicular, axillary or pectoral adenopathy.     Left upper body: No supraclavicular, axillary or pectoral adenopathy.  Skin:    General: Skin is warm and dry.     Capillary Refill: Capillary refill takes less than 2 seconds.      Findings: No rash.  Neurological:     Mental Status: She is alert and oriented to person, place, and time.     Gait: Gait is intact.  Psychiatric:        Attention and Perception: Attention normal.        Mood and Affect: Mood normal.        Speech: Speech normal.        Behavior: Behavior normal. Behavior is cooperative.        Thought Content: Thought content normal.        Judgment: Judgment normal.     Results for orders placed or performed in visit on 08/01/21  CBC with Differential/Platelet  Result Value Ref Range   WBC 5.3 3.4 - 10.8 x10E3/uL   RBC 4.70 3.77 - 5.28 x10E6/uL   Hemoglobin 14.2 11.1 - 15.9 g/dL   Hematocrit 43.3 34.0 - 46.6 %   MCV 92 79 - 97 fL   MCH 30.2 26.6 - 33.0 pg   MCHC 32.8 31.5 - 35.7 g/dL   RDW 13.1 11.7 - 15.4 %   Platelets 242 150 - 450 x10E3/uL   Neutrophils 56 Not Estab. %   Lymphs 32 Not Estab. %   Monocytes 8 Not Estab. %   Eos 3 Not Estab. %   Basos 1 Not Estab. %   Neutrophils Absolute 3.0 1.4 - 7.0 x10E3/uL   Lymphocytes Absolute 1.7 0.7 - 3.1 x10E3/uL   Monocytes Absolute 0.4 0.1 - 0.9 x10E3/uL   EOS (ABSOLUTE) 0.1 0.0 - 0.4 x10E3/uL   Basophils Absolute 0.0 0.0 - 0.2 x10E3/uL   Immature Granulocytes 0 Not Estab. %   Immature Grans (Abs) 0.0 0.0 - 0.1 x10E3/uL  Comprehensive metabolic panel  Result Value Ref Range   Glucose 102 (H) 70 - 99 mg/dL   BUN 19 8 - 27 mg/dL   Creatinine, Ser 0.67 0.57 - 1.00 mg/dL   eGFR 93 >59 mL/min/1.73   BUN/Creatinine Ratio 28 12 - 28   Sodium 141 134 - 144 mmol/L   Potassium 4.4 3.5 - 5.2 mmol/L   Chloride 105 96 - 106 mmol/L   CO2 24 20 - 29 mmol/L   Calcium 9.3 8.7 - 10.3 mg/dL   Total Protein 7.0 6.0 - 8.5 g/dL   Albumin 4.4 3.7 - 4.7 g/dL   Globulin, Total 2.6 1.5 - 4.5 g/dL   Albumin/Globulin Ratio 1.7 1.2 - 2.2   Bilirubin Total 0.3 0.0 - 1.2 mg/dL   Alkaline Phosphatase 169 (H) 44 - 121 IU/L   AST 24 0 - 40 IU/L   ALT 39 (H) 0 - 32 IU/L      Assessment & Plan:   Problem  List Items Addressed This Visit  Other   Hyperlipidemia    Chronic.  Controlled.  Continue with current medication regimen of Atorvastatin 44m daily.  Labs ordered today.  Return to clinic in 6 months for reevaluation.  Call sooner if concerns arise.        Relevant Orders   Lipid panel   Annual physical exam - Primary   Relevant Orders   CBC with Differential/Platelet   Comprehensive metabolic panel   Lipid panel   TSH   Urinalysis, Routine w reflex microscopic     Follow up plan: Return in about 6 months (around 08/10/2022) for HTN, HLD, DM2 FU.   LABORATORY TESTING:  - Pap smear: not applicable  IMMUNIZATIONS:   - Tdap: Tetanus vaccination status reviewed: last tetanus booster within 10 years. - Influenza: Refused - Pneumovax: Up to date - Prevnar: Up to date - COVID: Not applicable - HPV: Administered today - Shingrix vaccine:  Discussed at visit today  SCREENING: -Mammogram: Up to date  - Colonoscopy: Up to date  - Bone Density: Up to date  -Hearing Test: Not applicable  -Spirometry: Not applicable   PATIENT COUNSELING:   Advised to take 1 mg of folate supplement per day if capable of pregnancy.   Sexuality: Discussed sexually transmitted diseases, partner selection, use of condoms, avoidance of unintended pregnancy  and contraceptive alternatives.   Advised to avoid cigarette smoking.  I discussed with the patient that most people either abstain from alcohol or drink within safe limits (<=14/week and <=4 drinks/occasion for males, <=7/weeks and <= 3 drinks/occasion for females) and that the risk for alcohol disorders and other health effects rises proportionally with the number of drinks per week and how often a drinker exceeds daily limits.  Discussed cessation/primary prevention of drug use and availability of treatment for abuse.   Diet: Encouraged to adjust caloric intake to maintain  or achieve ideal body weight, to reduce intake of dietary  saturated fat and total fat, to limit sodium intake by avoiding high sodium foods and not adding table salt, and to maintain adequate dietary potassium and calcium preferably from fresh fruits, vegetables, and low-fat dairy products.    stressed the importance of regular exercise  Injury prevention: Discussed safety belts, safety helmets, smoke detector, smoking near bedding or upholstery.   Dental health: Discussed importance of regular tooth brushing, flossing, and dental visits.    NEXT PREVENTATIVE PHYSICAL DUE IN 1 YEAR. Return in about 6 months (around 08/10/2022) for HTN, HLD, DM2 FU.

## 2022-02-08 ENCOUNTER — Encounter: Payer: Self-pay | Admitting: Nurse Practitioner

## 2022-02-08 ENCOUNTER — Ambulatory Visit (INDEPENDENT_AMBULATORY_CARE_PROVIDER_SITE_OTHER): Payer: No Typology Code available for payment source | Admitting: Nurse Practitioner

## 2022-02-08 VITALS — BP 138/85 | HR 92 | Temp 98.4°F | Ht 63.0 in | Wt 172.2 lb

## 2022-02-08 DIAGNOSIS — Z0000 Encounter for general adult medical examination without abnormal findings: Secondary | ICD-10-CM | POA: Diagnosis not present

## 2022-02-08 DIAGNOSIS — R748 Abnormal levels of other serum enzymes: Secondary | ICD-10-CM | POA: Diagnosis not present

## 2022-02-08 DIAGNOSIS — D751 Secondary polycythemia: Secondary | ICD-10-CM

## 2022-02-08 DIAGNOSIS — E785 Hyperlipidemia, unspecified: Secondary | ICD-10-CM

## 2022-02-08 LAB — MICROSCOPIC EXAMINATION: Bacteria, UA: NONE SEEN

## 2022-02-08 LAB — URINALYSIS, ROUTINE W REFLEX MICROSCOPIC
Bilirubin, UA: NEGATIVE
Glucose, UA: NEGATIVE
Ketones, UA: NEGATIVE
Nitrite, UA: NEGATIVE
Protein,UA: NEGATIVE
RBC, UA: NEGATIVE
Specific Gravity, UA: 1.02 (ref 1.005–1.030)
Urobilinogen, Ur: 0.2 mg/dL (ref 0.2–1.0)
pH, UA: 5.5 (ref 5.0–7.5)

## 2022-02-08 MED ORDER — OMEPRAZOLE 40 MG PO CPDR: 40.0000 mg | DELAYED_RELEASE_CAPSULE | Freq: Every day | ORAL | 1 refills | Status: DC

## 2022-02-08 NOTE — Assessment & Plan Note (Signed)
Chronic.  Controlled.  Continue with current medication regimen of Atorvastatin '40mg'$  daily.  Labs ordered today.  Return to clinic in 6 months for reevaluation.  Call sooner if concerns arise.

## 2022-02-09 LAB — CBC WITH DIFFERENTIAL/PLATELET
Basophils Absolute: 0.1 10*3/uL (ref 0.0–0.2)
Basos: 1 %
EOS (ABSOLUTE): 0.2 10*3/uL (ref 0.0–0.4)
Eos: 2 %
Hematocrit: 50.1 % — ABNORMAL HIGH (ref 34.0–46.6)
Hemoglobin: 16.3 g/dL — ABNORMAL HIGH (ref 11.1–15.9)
Immature Grans (Abs): 0 10*3/uL (ref 0.0–0.1)
Immature Granulocytes: 0 %
Lymphocytes Absolute: 1.9 10*3/uL (ref 0.7–3.1)
Lymphs: 20 %
MCH: 30.5 pg (ref 26.6–33.0)
MCHC: 32.5 g/dL (ref 31.5–35.7)
MCV: 94 fL (ref 79–97)
Monocytes Absolute: 0.6 10*3/uL (ref 0.1–0.9)
Monocytes: 6 %
Neutrophils Absolute: 6.9 10*3/uL (ref 1.4–7.0)
Neutrophils: 71 %
Platelets: 339 10*3/uL (ref 150–450)
RBC: 5.34 x10E6/uL — ABNORMAL HIGH (ref 3.77–5.28)
RDW: 13.8 % (ref 11.7–15.4)
WBC: 9.6 10*3/uL (ref 3.4–10.8)

## 2022-02-09 LAB — COMPREHENSIVE METABOLIC PANEL
ALT: 46 IU/L — ABNORMAL HIGH (ref 0–32)
AST: 32 IU/L (ref 0–40)
Albumin/Globulin Ratio: 1.8 (ref 1.2–2.2)
Albumin: 4.8 g/dL (ref 3.8–4.8)
Alkaline Phosphatase: 209 IU/L — ABNORMAL HIGH (ref 44–121)
BUN/Creatinine Ratio: 20 (ref 12–28)
BUN: 18 mg/dL (ref 8–27)
Bilirubin Total: 0.6 mg/dL (ref 0.0–1.2)
CO2: 25 mmol/L (ref 20–29)
Calcium: 10 mg/dL (ref 8.7–10.3)
Chloride: 96 mmol/L (ref 96–106)
Creatinine, Ser: 0.92 mg/dL (ref 0.57–1.00)
Globulin, Total: 2.7 g/dL (ref 1.5–4.5)
Glucose: 103 mg/dL — ABNORMAL HIGH (ref 70–99)
Potassium: 4.5 mmol/L (ref 3.5–5.2)
Sodium: 141 mmol/L (ref 134–144)
Total Protein: 7.5 g/dL (ref 6.0–8.5)
eGFR: 67 mL/min/{1.73_m2} (ref >59)

## 2022-02-09 LAB — LIPID PANEL
Chol/HDL Ratio: 5.1 ratio — ABNORMAL HIGH (ref 0.0–4.4)
Cholesterol, Total: 216 mg/dL — ABNORMAL HIGH (ref 100–199)
HDL: 42 mg/dL (ref >39)
LDL Chol Calc (NIH): 122 mg/dL — ABNORMAL HIGH (ref 0–99)
Triglycerides: 295 mg/dL — ABNORMAL HIGH (ref 0–149)
VLDL Cholesterol Cal: 52 mg/dL — ABNORMAL HIGH (ref 5–40)

## 2022-02-09 LAB — TSH: TSH: 2.49 u[IU]/mL (ref 0.450–4.500)

## 2022-02-12 ENCOUNTER — Ambulatory Visit: Payer: No Typology Code available for payment source | Admitting: Physical Therapy

## 2022-02-12 NOTE — Addendum Note (Signed)
Addended by: Jon Billings on: 02/12/2022 03:15 PM   Modules accepted: Orders

## 2022-02-12 NOTE — Progress Notes (Signed)
Please let patient know that her complete blood count is elevated from prior.  Her blood is just showing that it is thicker than normal.  I would like her to come back in 2 weeks and repeat it to make sure it comes back to normal.  Her liver enzymes increased from prior.  I have ordered an Ultrasound of her liver to evaluate this further.  Cholesterol is elevated from prior.  I recommend following a low fat diet and exercise. Otherwise, her lab work looks good.  No other concerns at this time.

## 2022-02-20 ENCOUNTER — Ambulatory Visit: Payer: No Typology Code available for payment source

## 2022-02-20 ENCOUNTER — Ambulatory Visit: Payer: No Typology Code available for payment source | Admitting: Physical Therapy

## 2022-02-21 ENCOUNTER — Other Ambulatory Visit: Payer: No Typology Code available for payment source

## 2022-02-21 ENCOUNTER — Ambulatory Visit: Payer: No Typology Code available for payment source | Admitting: Physical Therapy

## 2022-02-21 DIAGNOSIS — D751 Secondary polycythemia: Secondary | ICD-10-CM | POA: Diagnosis not present

## 2022-02-22 LAB — CBC WITH DIFFERENTIAL/PLATELET
Basophils Absolute: 0 10*3/uL (ref 0.0–0.2)
Basos: 0 %
EOS (ABSOLUTE): 0 10*3/uL (ref 0.0–0.4)
Eos: 1 %
Hematocrit: 45.3 % (ref 34.0–46.6)
Hemoglobin: 15.1 g/dL (ref 11.1–15.9)
Immature Grans (Abs): 0 10*3/uL (ref 0.0–0.1)
Immature Granulocytes: 0 %
Lymphocytes Absolute: 1.4 10*3/uL (ref 0.7–3.1)
Lymphs: 31 %
MCH: 30.6 pg (ref 26.6–33.0)
MCHC: 33.3 g/dL (ref 31.5–35.7)
MCV: 92 fL (ref 79–97)
Monocytes Absolute: 0.5 10*3/uL (ref 0.1–0.9)
Monocytes: 10 %
Neutrophils Absolute: 2.6 10*3/uL (ref 1.4–7.0)
Neutrophils: 58 %
Platelets: 210 10*3/uL (ref 150–450)
RBC: 4.93 x10E6/uL (ref 3.77–5.28)
RDW: 14.1 % (ref 11.7–15.4)
WBC: 4.5 10*3/uL (ref 3.4–10.8)

## 2022-02-22 NOTE — Progress Notes (Signed)
Please let patient know that her lab work returned to normal.  No concerns at this time.  Follow up as discussed.

## 2022-04-01 ENCOUNTER — Ambulatory Visit
Admission: RE | Admit: 2022-04-01 | Discharge: 2022-04-01 | Disposition: A | Discharge status: Home / Self Care | Payer: No Typology Code available for payment source | Source: Ambulatory Visit | Attending: Nurse Practitioner | Admitting: Nurse Practitioner

## 2022-04-01 DIAGNOSIS — R945 Abnormal results of liver function studies: Secondary | ICD-10-CM | POA: Diagnosis not present

## 2022-04-01 DIAGNOSIS — K7689 Other specified diseases of liver: Secondary | ICD-10-CM | POA: Diagnosis not present

## 2022-04-01 DIAGNOSIS — R748 Abnormal levels of other serum enzymes: Secondary | ICD-10-CM | POA: Diagnosis not present

## 2022-04-01 NOTE — Progress Notes (Signed)
Please let patient know that her ultrasound shows that she does have a fatty liver.  She also has a couple of cysts. Both of these are unchanged from her prior exam.  We will keep an eye on them in the future.

## 2022-04-02 ENCOUNTER — Encounter: Payer: Self-pay | Admitting: Nurse Practitioner

## 2022-04-10 NOTE — Progress Notes (Unsigned)
   There were no vitals taken for this visit.   Subjective:    Patient ID: Abigail Delacruz, female    DOB: Oct 09, 1950, 72 y.o.   MRN: [id number]  HPI: Abigail Delacruz is a 72 y.o. female  No chief complaint on file.   Relevant past medical, surgical, family and social history reviewed and updated as indicated. Interim medical history since our last visit reviewed. Allergies and medications reviewed and updated.  Review of Systems  Per HPI unless specifically indicated above     Objective:    There were no vitals taken for this visit.  Wt Readings from Last 3 Encounters:  02/08/22 172 lb 3.2 oz (78.1 kg)  12/18/21 174 lb 9.6 oz (79.2 kg)  11/29/21 184 lb 9.6 oz (83.7 kg)    Physical Exam  Results for orders placed or performed in visit on 02/21/22  CBC w/Diff  Result Value Ref Range   WBC 4.5 3.4 - 10.8 x10E3/uL   RBC 4.93 3.77 - 5.28 x10E6/uL   Hemoglobin 15.1 11.1 - 15.9 g/dL   Hematocrit 45.3 34.0 - 46.6 %   MCV 92 79 - 97 fL   MCH 30.6 26.6 - 33.0 pg   MCHC 33.3 31.5 - 35.7 g/dL   RDW 14.1 11.7 - 15.4 %   Platelets 210 150 - 450 x10E3/uL   Neutrophils 58 Not Estab. %   Lymphs 31 Not Estab. %   Monocytes 10 Not Estab. %   Eos 1 Not Estab. %   Basos 0 Not Estab. %   Neutrophils Absolute 2.6 1.4 - 7.0 x10E3/uL   Lymphocytes Absolute 1.4 0.7 - 3.1 x10E3/uL   Monocytes Absolute 0.5 0.1 - 0.9 x10E3/uL   EOS (ABSOLUTE) 0.0 0.0 - 0.4 x10E3/uL   Basophils Absolute 0.0 0.0 - 0.2 x10E3/uL   Immature Granulocytes 0 Not Estab. %   Immature Grans (Abs) 0.0 0.0 - 0.1 x10E3/uL      Assessment & Plan:   Problem List Items Addressed This Visit   None    Follow up plan: No follow-ups on file.

## 2022-04-11 ENCOUNTER — Ambulatory Visit
Admission: RE | Admit: 2022-04-11 | Discharge: 2022-04-11 | Disposition: A | Discharge status: Home / Self Care | Payer: No Typology Code available for payment source | Attending: Nurse Practitioner | Admitting: Nurse Practitioner

## 2022-04-11 ENCOUNTER — Ambulatory Visit
Admission: RE | Admit: 2022-04-11 | Discharge: 2022-04-11 | Disposition: A | Discharge status: Home / Self Care | Payer: No Typology Code available for payment source | Source: Ambulatory Visit | Attending: Nurse Practitioner | Admitting: Nurse Practitioner

## 2022-04-11 ENCOUNTER — Ambulatory Visit (INDEPENDENT_AMBULATORY_CARE_PROVIDER_SITE_OTHER): Payer: No Typology Code available for payment source | Admitting: Nurse Practitioner

## 2022-04-11 ENCOUNTER — Encounter: Payer: Self-pay | Admitting: Nurse Practitioner

## 2022-04-11 VITALS — BP 130/77 | HR 76 | Temp 97.8°F | Wt 170.8 lb

## 2022-04-11 DIAGNOSIS — R053 Chronic cough: Secondary | ICD-10-CM | POA: Insufficient documentation

## 2022-04-11 DIAGNOSIS — M25551 Pain in right hip: Secondary | ICD-10-CM

## 2022-04-11 DIAGNOSIS — R059 Cough, unspecified: Secondary | ICD-10-CM | POA: Diagnosis not present

## 2022-04-11 DIAGNOSIS — M1611 Unilateral primary osteoarthritis, right hip: Secondary | ICD-10-CM | POA: Diagnosis not present

## 2022-04-11 DIAGNOSIS — K760 Fatty (change of) liver, not elsewhere classified: Secondary | ICD-10-CM | POA: Diagnosis not present

## 2022-04-11 DIAGNOSIS — E782 Mixed hyperlipidemia: Secondary | ICD-10-CM | POA: Diagnosis not present

## 2022-04-11 MED ORDER — TIRZEPATIDE 2.5 MG/0.5ML ~~LOC~~ SOAJ: 2.5000 mg | SUBCUTANEOUS | 0 refills | Status: DC

## 2022-04-11 MED ORDER — ATORVASTATIN CALCIUM 80 MG PO TABS: 80.0000 mg | ORAL_TABLET | Freq: Every day | ORAL | 1 refills | Status: DC

## 2022-04-11 MED ORDER — CETIRIZINE HCL 10 MG PO TABS: 10.0000 mg | ORAL_TABLET | Freq: Every day | ORAL | 11 refills | Status: DC

## 2022-04-11 NOTE — Assessment & Plan Note (Signed)
Ongoing concern.  Would like to try GLP1 to help with fatty liver.  Will send Mounjaro to help with weight loss.  Side effects and benefits of medication discussed.  Discussed how to titrate medication and how to injection.  Follow up in 1 month.  Call sooner if concerns arise.

## 2022-04-11 NOTE — Assessment & Plan Note (Signed)
Chronic.  Patient has a tendency to stop medication when her cholesterol is well controlled.  Discussed the importance of taking cholesterol medication routinely.  Recommend increasing dose of atorvastatin to 68m.  Patient agrees to start medication.

## 2022-04-15 NOTE — Progress Notes (Signed)
Good Morning.  Your chest xray was normal which is great.  Continue with the plan as discussed during the visit.

## 2022-04-15 NOTE — Progress Notes (Signed)
Good Morning.  Your hip xray shows that you have mild to moderate arthritis in your hip.  That is likely the cause of your pain.  Continue with the heating pad.

## 2022-05-09 ENCOUNTER — Encounter: Payer: Self-pay | Admitting: Nurse Practitioner

## 2022-05-09 ENCOUNTER — Ambulatory Visit (INDEPENDENT_AMBULATORY_CARE_PROVIDER_SITE_OTHER): Payer: No Typology Code available for payment source | Admitting: Nurse Practitioner

## 2022-05-09 VITALS — BP 130/74 | HR 73 | Temp 97.9°F | Wt 167.6 lb

## 2022-05-09 DIAGNOSIS — K760 Fatty (change of) liver, not elsewhere classified: Secondary | ICD-10-CM

## 2022-05-09 DIAGNOSIS — Z9889 Other specified postprocedural states: Secondary | ICD-10-CM | POA: Diagnosis not present

## 2022-05-09 DIAGNOSIS — Z0100 Encounter for examination of eyes and vision without abnormal findings: Secondary | ICD-10-CM | POA: Diagnosis not present

## 2022-05-09 DIAGNOSIS — H43812 Vitreous degeneration, left eye: Secondary | ICD-10-CM | POA: Diagnosis not present

## 2022-05-09 MED ORDER — TIRZEPATIDE 2.5 MG/0.5ML ~~LOC~~ SOAJ: 2.5000 mg | SUBCUTANEOUS | 0 refills | Status: DC

## 2022-05-09 NOTE — Progress Notes (Signed)
BP 130/74   Pulse 73   Temp 97.9 F (36.6 C) (Oral)   Wt 167 lb 9.6 oz (76 kg)   SpO2 97%   BMI 29.69 kg/m    Subjective:    Patient ID: Abigail Delacruz, female    DOB: 1951-02-09, 72 y.o.   MRN: [id number]  HPI: Abigail Delacruz is a 72 y.o. female  Chief Complaint  Patient presents with   Weight Check    WEIGHT GAIN Patient states she would like to try something for weight loss due to fatty liver.  Has not started the [facility] since insurance has not covered it.  Duration: years Would like to try something to help with her with her fatty liver.    The 10-year ASCVD risk score (Arnett DK, et al., 2019) is: 12.6%   Values used to calculate the score:     Age: 21 years     Sex: Female     Is Non-Hispanic African American: No     Diabetic: No     Tobacco smoker: No     Systolic Blood Pressure: [id number]     Is BP treated: No     HDL Cholesterol: 42 mg/dL     Total Cholesterol: 216 mg/dL   Relevant past medical, surgical, family and social history reviewed and updated as indicated. Interim medical history since our last visit reviewed. Allergies and medications reviewed and updated.  Review of Systems  Constitutional:  Negative for unexpected weight change.    Per HPI unless specifically indicated above     Objective:    BP 130/74   Pulse 73   Temp 97.9 F (36.6 C) (Oral)   Wt 167 lb 9.6 oz (76 kg)   SpO2 97%   BMI 29.69 kg/m   Wt Readings from Last 3 Encounters:  05/09/22 167 lb 9.6 oz (76 kg)  04/11/22 170 lb 12.8 oz (77.5 kg)  02/08/22 172 lb 3.2 oz (78.1 kg)    Physical Exam Vitals and nursing note reviewed.  Constitutional:      General: She is not in acute distress.    Appearance: Normal appearance. She is normal weight. She is not ill-appearing, toxic-appearing or diaphoretic.  HENT:     Head: Normocephalic.     Right Ear: External ear normal.     Left Ear: External ear normal.     Nose: Nose normal.     Mouth/Throat:     Mouth: Mucous  membranes are moist.     Pharynx: Oropharynx is clear.  Eyes:     General:        Right eye: No discharge.        Left eye: No discharge.     Extraocular Movements: Extraocular movements intact.     Conjunctiva/sclera: Conjunctivae normal.     Pupils: Pupils are equal, round, and reactive to light.  Cardiovascular:     Rate and Rhythm: Normal rate and regular rhythm.     Heart sounds: No murmur heard. Pulmonary:     Effort: Pulmonary effort is normal. No respiratory distress.     Breath sounds: Normal breath sounds. No wheezing or rales.  Musculoskeletal:     Cervical back: Normal range of motion and neck supple.  Skin:    General: Skin is warm and dry.     Capillary Refill: Capillary refill takes less than 2 seconds.  Neurological:     General: No focal deficit present.     Mental Status: She  is alert and oriented to person, place, and time. Mental status is at baseline.  Psychiatric:        Mood and Affect: Mood normal.        Behavior: Behavior normal.        Thought Content: Thought content normal.        Judgment: Judgment normal.     Results for orders placed or performed in visit on 02/21/22  CBC w/Diff  Result Value Ref Range   WBC 4.5 3.4 - 10.8 x10E3/uL   RBC 4.93 3.77 - 5.28 x10E6/uL   Hemoglobin 15.1 11.1 - 15.9 g/dL   Hematocrit 45.3 34.0 - 46.6 %   MCV 92 79 - 97 fL   MCH 30.6 26.6 - 33.0 pg   MCHC 33.3 31.5 - 35.7 g/dL   RDW 14.1 11.7 - 15.4 %   Platelets 210 150 - 450 x10E3/uL   Neutrophils 58 Not Estab. %   Lymphs 31 Not Estab. %   Monocytes 10 Not Estab. %   Eos 1 Not Estab. %   Basos 0 Not Estab. %   Neutrophils Absolute 2.6 1.4 - 7.0 x10E3/uL   Lymphocytes Absolute 1.4 0.7 - 3.1 x10E3/uL   Monocytes Absolute 0.5 0.1 - 0.9 x10E3/uL   EOS (ABSOLUTE) 0.0 0.0 - 0.4 x10E3/uL   Basophils Absolute 0.0 0.0 - 0.2 x10E3/uL   Immature Granulocytes 0 Not Estab. %   Immature Grans (Abs) 0.0 0.0 - 0.1 x10E3/uL      Assessment & Plan:   Problem List  Items Addressed This Visit       Digestive   Fatty liver - Primary    Chronic. Ongoing concern.  Discussed with patient that I have written a letter to appeal the insurance denial.  Will send the letter again with official appeal document.  Discussed that it is okay to pick up and pay for it out of pocket if able to.  Will follow up in 6 weeks.          Follow up plan: Return in about 6 weeks (around 06/20/2022) for Medication Management.

## 2022-05-09 NOTE — Assessment & Plan Note (Signed)
Chronic. Ongoing concern.  Discussed with patient that I have written a letter to appeal the insurance denial.  Will send the letter again with official appeal document.  Discussed that it is okay to pick up and pay for it out of pocket if able to.  Will follow up in 6 weeks.

## 2022-06-21 ENCOUNTER — Ambulatory Visit: Payer: No Typology Code available for payment source | Admitting: Nurse Practitioner

## 2022-07-05 ENCOUNTER — Other Ambulatory Visit: Payer: Self-pay | Admitting: Family Medicine

## 2022-07-05 ENCOUNTER — Ambulatory Visit
Admission: RE | Admit: 2022-07-05 | Discharge: 2022-07-05 | Disposition: A | Discharge status: Home / Self Care | Payer: No Typology Code available for payment source | Source: Ambulatory Visit | Attending: Nurse Practitioner

## 2022-07-05 ENCOUNTER — Ambulatory Visit (INDEPENDENT_AMBULATORY_CARE_PROVIDER_SITE_OTHER): Payer: No Typology Code available for payment source | Admitting: Nurse Practitioner

## 2022-07-05 ENCOUNTER — Encounter: Payer: Self-pay | Admitting: Nurse Practitioner

## 2022-07-05 VITALS — BP 119/73 | HR 73 | Temp 98.0°F | Wt 165.3 lb

## 2022-07-05 DIAGNOSIS — R600 Localized edema: Secondary | ICD-10-CM | POA: Diagnosis not present

## 2022-07-05 DIAGNOSIS — R2241 Localized swelling, mass and lump, right lower limb: Secondary | ICD-10-CM

## 2022-07-05 MED ORDER — DICLOFENAC SODIUM 1 % EX GEL: 4.0000 g | Freq: Four times a day (QID) | CUTANEOUS | 2 refills | Status: DC

## 2022-07-05 NOTE — Progress Notes (Signed)
BP 119/73   Pulse 73   Temp 98 F (36.7 C) (Oral)   Wt 165 lb 4.8 oz (75 kg)   SpO2 98%   BMI 29.28 kg/m    Subjective:    Patient ID: Abigail Delacruz, female    DOB: 12/30/1950, 72 y.o.   MRN: [id number]  HPI: [name] is a 72 y.o. female  Chief Complaint  Patient presents with   Foot Swelling    Pt states she has been noticing some swelling in her right foot and ankle for the last few days. States she notices the swelling more at night and states her foot feels very tight. States she has decreased ROM as well.    FOOT SWELLING Patient states she has noticed some right foot swelling for the past few days.  She notices it more at night and states her foot feels very tight.  No injury.  No SOB.     Relevant past medical, surgical, family and social history reviewed and updated as indicated. Interim medical history since our last visit reviewed. Allergies and medications reviewed and updated.  Review of Systems  Musculoskeletal:        Right lower leg swelling.    Per HPI unless specifically indicated above     Objective:    BP 119/73   Pulse 73   Temp 98 F (36.7 C) (Oral)   Wt 165 lb 4.8 oz (75 kg)   SpO2 98%   BMI 29.28 kg/m   Wt Readings from Last 3 Encounters:  07/05/22 165 lb 4.8 oz (75 kg)  05/09/22 167 lb 9.6 oz (76 kg)  04/11/22 170 lb 12.8 oz (77.5 kg)    Physical Exam Vitals and nursing note reviewed.  Constitutional:      General: She is not in acute distress.    Appearance: Normal appearance. She is normal weight. She is not ill-appearing, toxic-appearing or diaphoretic.  HENT:     Head: Normocephalic.     Right Ear: External ear normal.     Left Ear: External ear normal.     Nose: Nose normal.     Mouth/Throat:     Mouth: Mucous membranes are moist.     Pharynx: Oropharynx is clear.  Eyes:     General:        Right eye: No discharge.        Left eye: No discharge.     Extraocular Movements: Extraocular movements intact.      Conjunctiva/sclera: Conjunctivae normal.     Pupils: Pupils are equal, round, and reactive to light.  Cardiovascular:     Rate and Rhythm: Normal rate and regular rhythm.     Heart sounds: No murmur heard. Pulmonary:     Effort: Pulmonary effort is normal. No respiratory distress.     Breath sounds: Normal breath sounds. No wheezing or rales.  Musculoskeletal:     Cervical back: Normal range of motion and neck supple.     Right lower leg: Tenderness present. No swelling.     Right ankle: Swelling present. No deformity, ecchymosis or lacerations. No tenderness. Decreased range of motion. Normal pulse.     Left ankle: No swelling, deformity, ecchymosis or lacerations. No tenderness. Normal range of motion. Anterior drawer test negative. Normal pulse.     Comments: 14 3/4" measure for right and left calf  Skin:    General: Skin is warm and dry.     Capillary Refill: Capillary refill takes less than  2 seconds.  Neurological:     General: No focal deficit present.     Mental Status: She is alert and oriented to person, place, and time. Mental status is at baseline.  Psychiatric:        Mood and Affect: Mood normal.        Behavior: Behavior normal.        Thought Content: Thought content normal.        Judgment: Judgment normal.     Results for orders placed or performed in visit on 02/21/22  CBC w/Diff  Result Value Ref Range   WBC 4.5 3.4 - 10.8 x10E3/uL   RBC 4.93 3.77 - 5.28 x10E6/uL   Hemoglobin 15.1 11.1 - 15.9 g/dL   Hematocrit 16.1 09.6 - 46.6 %   MCV 92 79 - 97 fL   MCH 30.6 26.6 - 33.0 pg   MCHC 33.3 31.5 - 35.7 g/dL   RDW 04.5 40.9 - 81.1 %   Platelets 210 150 - 450 x10E3/uL   Neutrophils 58 Not Estab. %   Lymphs 31 Not Estab. %   Monocytes 10 Not Estab. %   Eos 1 Not Estab. %   Basos 0 Not Estab. %   Neutrophils Absolute 2.6 1.4 - 7.0 x10E3/uL   Lymphocytes Absolute 1.4 0.7 - 3.1 x10E3/uL   Monocytes Absolute 0.5 0.1 - 0.9 x10E3/uL   EOS (ABSOLUTE) 0.0 0.0 - 0.4  x10E3/uL   Basophils Absolute 0.0 0.0 - 0.2 x10E3/uL   Immature Granulocytes 0 Not Estab. %   Immature Grans (Abs) 0.0 0.0 - 0.1 x10E3/uL      Assessment & Plan:   Problem List Items Addressed This Visit   None Visit Diagnoses     Localized swelling of right lower leg    -  Primary   Right leg swelling. No injury. Pain with minimal palpation of right calf. No visible swelling of calf but ankle is swollen on exam. Will order DVT US STAT.   Relevant Orders   VAS [place] LOWER EXTREMITY VENOUS (DVT)        Follow up plan: No follow-ups on file.   A total of 30 minutes were spent on this encounter today.  When total time is documented, this includes both the face-to-face and non-face-to-face time personally spent before, during and after the visit on the date of the encounter on exam, ordering tests and coordinating care.

## 2022-07-05 NOTE — Progress Notes (Signed)
Please let patient know that her [place] did not show a DVT.  Pain is likely musculoskeletal.  I recommend she use tylenol, elevate her feet and increase her water intake.  If swelling done not improve, we will do an xray of her ankle.

## 2022-07-05 NOTE — Addendum Note (Signed)
Addended by: Pablo Ledger on: 07/05/2022 12:19 PM   Modules accepted: Orders

## 2022-08-12 ENCOUNTER — Ambulatory Visit (INDEPENDENT_AMBULATORY_CARE_PROVIDER_SITE_OTHER): Payer: No Typology Code available for payment source | Admitting: Nurse Practitioner

## 2022-08-12 ENCOUNTER — Encounter: Payer: Self-pay | Admitting: Nurse Practitioner

## 2022-08-12 VITALS — BP 114/81 | HR 79 | Temp 97.8°F | Wt 161.0 lb

## 2022-08-12 DIAGNOSIS — E782 Mixed hyperlipidemia: Secondary | ICD-10-CM

## 2022-08-12 DIAGNOSIS — E785 Hyperlipidemia, unspecified: Secondary | ICD-10-CM | POA: Diagnosis not present

## 2022-08-12 DIAGNOSIS — K760 Fatty (change of) liver, not elsewhere classified: Secondary | ICD-10-CM | POA: Diagnosis not present

## 2022-08-12 MED ORDER — OMEPRAZOLE 40 MG PO CPDR: 40.0000 mg | DELAYED_RELEASE_CAPSULE | Freq: Every day | ORAL | 1 refills | Status: DC

## 2022-08-12 MED ORDER — ATORVASTATIN CALCIUM 80 MG PO TABS
80.0000 mg | ORAL_TABLET | Freq: Every day | ORAL | 1 refills | Status: DC
Start: 2022-08-12 — End: 2023-02-11

## 2022-08-12 NOTE — Assessment & Plan Note (Signed)
Chronic. Ongoing concern.  Has lost 7lbs since our last visit.  Continue with weight loss.  CMP ordered at visit today.  Follow up in 6 months.  Call sooner if concerns arise.

## 2022-08-12 NOTE — Progress Notes (Signed)
BP 114/81   Pulse 79   Temp 97.8 F (36.6 C) (Oral)   Wt 161 lb (73 kg)   SpO2 92%   BMI 28.52 kg/m    Subjective:    Patient ID: Abigail Delacruz, female    DOB: October 03, 1950, 72 y.o.   MRN: [id number]  HPI: Abigail Delacruz is a 72 y.o. female  Chief Complaint  Patient presents with   Hyperlipidemia   HYPERLIPIDEMIA Hyperlipidemia status: excellent compliance Satisfied with current treatment?  yes Side effects:  no Medication compliance: excellent compliance Past cholesterol meds: atorvastain (lipitor) Supplements: none Aspirin:  no The 10-year ASCVD risk score (Arnett DK, et al., 2019) is: 11.5%   Values used to calculate the score:     Age: 83 years     Sex: Female     Is Non-Hispanic African American: No     Diabetic: No     Tobacco smoker: No     Systolic Blood Pressure: 138 mmHg     Is BP treated: No     HDL Cholesterol: 48 mg/dL     Total Cholesterol: 148 mg/dL Chest pain:  no Coronary artery disease:  no Family history CAD:  no Family history early CAD:  no   Denies HA, CP, SOB, dizziness, palpitations, visual changes.    Relevant past medical, surgical, family and social history reviewed and updated as indicated. Interim medical history since our last visit reviewed. Allergies and medications reviewed and updated.  Review of Systems  Eyes:  Negative for visual disturbance.  Respiratory:  Negative for cough, chest tightness and shortness of breath.   Cardiovascular:  Negative for chest pain and palpitations.  Neurological:  Negative for dizziness and headaches.    Per HPI unless specifically indicated above     Objective:    BP 114/81   Pulse 79   Temp 97.8 F (36.6 C) (Oral)   Wt 161 lb (73 kg)   SpO2 92%   BMI 28.52 kg/m   Wt Readings from Last 3 Encounters:  08/12/22 161 lb (73 kg)  07/05/22 165 lb 4.8 oz (75 kg)  05/09/22 167 lb 9.6 oz (76 kg)    Physical Exam Vitals and nursing note reviewed.  Constitutional:      General:  She is not in acute distress.    Appearance: Normal appearance. She is normal weight. She is not ill-appearing, toxic-appearing or diaphoretic.  HENT:     Head: Normocephalic.     Right Ear: External ear normal.     Left Ear: External ear normal.     Nose: Nose normal.     Mouth/Throat:     Mouth: Mucous membranes are moist.     Pharynx: Oropharynx is clear.  Eyes:     General:        Right eye: No discharge.        Left eye: No discharge.     Extraocular Movements: Extraocular movements intact.     Conjunctiva/sclera: Conjunctivae normal.     Pupils: Pupils are equal, round, and reactive to light.  Cardiovascular:     Rate and Rhythm: Normal rate and regular rhythm.     Heart sounds: No murmur heard. Pulmonary:     Effort: Pulmonary effort is normal. No respiratory distress.     Breath sounds: Normal breath sounds. No wheezing or rales.  Musculoskeletal:     Cervical back: Normal range of motion and neck supple.  Skin:    General: Skin is warm  and dry.     Capillary Refill: Capillary refill takes less than 2 seconds.  Neurological:     General: No focal deficit present.     Mental Status: She is alert and oriented to person, place, and time. Mental status is at baseline.  Psychiatric:        Mood and Affect: Mood normal.        Behavior: Behavior normal.        Thought Content: Thought content normal.        Judgment: Judgment normal.     Results for orders placed or performed in visit on 02/21/22  CBC w/Diff  Result Value Ref Range   WBC 4.5 3.4 - 10.8 x10E3/uL   RBC 4.93 3.77 - 5.28 x10E6/uL   Hemoglobin 15.1 11.1 - 15.9 g/dL   Hematocrit 23.5 57.3 - 46.6 %   MCV 92 79 - 97 fL   MCH 30.6 26.6 - 33.0 pg   MCHC 33.3 31.5 - 35.7 g/dL   RDW 22.0 25.4 - 27.0 %   Platelets 210 150 - 450 x10E3/uL   Neutrophils 58 Not Estab. %   Lymphs 31 Not Estab. %   Monocytes 10 Not Estab. %   Eos 1 Not Estab. %   Basos 0 Not Estab. %   Neutrophils Absolute 2.6 1.4 - 7.0  x10E3/uL   Lymphocytes Absolute 1.4 0.7 - 3.1 x10E3/uL   Monocytes Absolute 0.5 0.1 - 0.9 x10E3/uL   EOS (ABSOLUTE) 0.0 0.0 - 0.4 x10E3/uL   Basophils Absolute 0.0 0.0 - 0.2 x10E3/uL   Immature Granulocytes 0 Not Estab. %   Immature Grans (Abs) 0.0 0.0 - 0.1 x10E3/uL      Assessment & Plan:   Problem List Items Addressed This Visit       Digestive   Fatty liver - Primary    Chronic. Ongoing concern.  Has lost 7lbs since our last visit.  Continue with weight loss.  CMP ordered at visit today.  Follow up in 6 months.  Call sooner if concerns arise.       Relevant Orders   Comp Met (CMET)     Other   Hyperlipidemia    Chronic.  Controlled.  Continue with current medication regimen of atorvastatin.  Refills sent today.  Labs ordered today.  Return to clinic in 6 months for reevaluation.  Call sooner if concerns arise.        Relevant Medications   atorvastatin (LIPITOR) 80 MG tablet   Other Relevant Orders   Lipid Profile     Follow up plan: Return in about 6 months (around 02/11/2023) for Physical and Fasting labs.

## 2022-08-12 NOTE — Assessment & Plan Note (Signed)
Chronic.  Controlled.  Continue with current medication regimen of atorvastatin.  Refills sent today.  Labs ordered today.  Return to clinic in 6 months for reevaluation.  Call sooner if concerns arise.

## 2022-08-13 LAB — COMPREHENSIVE METABOLIC PANEL
ALT: 27 IU/L (ref 0–32)
AST: 23 IU/L (ref 0–40)
Albumin: 4.7 g/dL (ref 3.8–4.8)
Alkaline Phosphatase: 180 IU/L — ABNORMAL HIGH (ref 44–121)
BUN/Creatinine Ratio: 32 — ABNORMAL HIGH (ref 12–28)
BUN: 23 mg/dL (ref 8–27)
Bilirubin Total: 0.4 mg/dL (ref 0.0–1.2)
CO2: 27 mmol/L (ref 20–29)
Calcium: 9.6 mg/dL (ref 8.7–10.3)
Chloride: 97 mmol/L (ref 96–106)
Creatinine, Ser: 0.73 mg/dL (ref 0.57–1.00)
Globulin, Total: 2.7 g/dL (ref 1.5–4.5)
Glucose: 97 mg/dL (ref 70–99)
Potassium: 4.1 mmol/L (ref 3.5–5.2)
Sodium: 138 mmol/L (ref 134–144)
Total Protein: 7.4 g/dL (ref 6.0–8.5)
eGFR: 87 mL/min/{1.73_m2} (ref >59)

## 2022-08-13 LAB — LIPID PANEL
Chol/HDL Ratio: 3.3 ratio (ref 0.0–4.4)
Cholesterol, Total: 174 mg/dL (ref 100–199)
HDL: 53 mg/dL (ref >39)
LDL Chol Calc (NIH): 96 mg/dL (ref 0–99)
Triglycerides: 144 mg/dL (ref 0–149)
VLDL Cholesterol Cal: 25 mg/dL (ref 5–40)

## 2022-08-13 NOTE — Progress Notes (Signed)
Hi Abigail Delacruz. It was nice to see you yesterday.  Your lab work looks good. Your liver enzymes have improved with your weight loss.  Keep up the good work.  No concerns at this time. Continue with your current medication regimen.  Follow up as discussed.  Please let me know if you have any questions.

## 2022-09-02 ENCOUNTER — Ambulatory Visit: Payer: No Typology Code available for payment source

## 2022-09-02 VITALS — Ht 63.0 in | Wt 161.0 lb

## 2022-09-02 DIAGNOSIS — Z0000 Encounter for general adult medical examination without abnormal findings: Secondary | ICD-10-CM

## 2022-09-02 DIAGNOSIS — Z780 Asymptomatic menopausal state: Secondary | ICD-10-CM

## 2022-09-02 NOTE — Progress Notes (Signed)
Subjective:   Abigail Delacruz is a 72 y.o. female who presents for Medicare Annual (Subsequent) preventive examination.  Visit Complete: Virtual  I connected with  Abigail Delacruz on 09/02/22 by a audio enabled telemedicine application and verified that I am speaking with the correct person using two identifiers.  Patient Location: Home  Provider Location: Office/Clinic  I discussed the limitations of evaluation and management by telemedicine. The patient expressed understanding and agreed to proceed.   Review of Systems     Cardiac Risk Factors include: advanced age (>11men, >54 women);dyslipidemia;sedentary lifestyle     Objective:    Today's Vitals   09/02/22 1100 09/02/22 1116  Weight:  161 lb (73 kg)  Height:  5\' 3"  (1.6 m)  PainSc: 0-No pain    Body mass index is 28.52 kg/m.     09/02/2022   11:05 AM 09/25/2021    8:16 AM 08/30/2021   12:46 PM 08/07/2020    3:20 PM 06/30/2020   10:43 AM 07/27/2019    8:42 AM 10/20/2017    9:47 AM  Advanced Directives  Does Patient Have a Medical Advance Directive? No No No No No No No  Would patient like information on creating a medical advance directive? No - Patient declined No - Patient declined No - Patient declined  No - Patient declined Yes (MAU/Ambulatory/Procedural Areas - Information given) No - Patient declined    Current Medications (verified) Outpatient Encounter Medications as of 09/02/2022  Medication Sig   atorvastatin (LIPITOR) 80 MG tablet Take 1 tablet (80 mg total) by mouth daily.   CALCIUM-MAGNESIUM-ZINC PO Take 1 tablet by mouth daily.   Cholecalciferol (VITAMIN D3) 1.25 MG (50000 UT) TABS Take by mouth.   diclofenac Sodium (VOLTAREN) 1 % GEL Apply 4 g topically 4 (four) times daily.   Multiple Vitamins-Minerals (MULTIVITAMIN ADULT) CHEW Chew by mouth.   omeprazole (PRILOSEC) 40 MG capsule Take 1 capsule (40 mg total) by mouth daily.   cetirizine (ZYRTEC) 10 MG tablet Take 1 tablet (10 mg total) by mouth daily.  (Patient not taking: Reported on 09/02/2022)   No facility-administered encounter medications on file as of 09/02/2022.    Allergies (verified) Ciprodex [ciprofloxacin-dexamethasone]   History: Past Medical History:  Diagnosis Date   Cataract    Hyperlipidemia    Past Surgical History:  Procedure Laterality Date   APPENDECTOMY  1971   CATARACT EXTRACTION W/PHACO Right 09/08/2017   Procedure: CATARACT EXTRACTION PHACO AND INTRAOCULAR LENS PLACEMENT (IOC)  RIGHT;  Surgeon: Nevada Crane, MD;  Location: [facility];  Service: Ophthalmology;  Laterality: Right;   CATARACT EXTRACTION W/PHACO Left 09/29/2017   Procedure: CATARACT EXTRACTION PHACO AND INTRAOCULAR LENS PLACEMENT (IOC) LEFT;  Surgeon: Nevada Crane, MD;  Location: [facility];  Service: Ophthalmology;  Laterality: Left;   COLONOSCOPY WITH PROPOFOL N/A 10/20/2017   Procedure: COLONOSCOPY WITH PROPOFOL;  Surgeon: Midge Minium, MD;  Location: [facility];  Service: Endoscopy;  Laterality: N/A;   Family History  Problem Relation Age of Onset   Kidney failure Mother    Cirrhosis Father    Alcohol abuse Father    Diabetes Brother    Hypertension Brother    Arthritis Brother    Breast cancer Neg Hx    Social History   Socioeconomic History   Marital status: Married    Spouse name: Not on file   Number of children: 2   Years of education: Not on file   Highest education level:  Some college, no degree  Occupational History   Not on file  Tobacco Use   Smoking status: Former    Types: Cigarettes    Quit date: 1973    Years since quitting: 51.5   Smokeless tobacco: Never  Vaping Use   Vaping Use: Never used  Substance and Sexual Activity   Alcohol use: Yes    Comment: social 1x/yr   Drug use: Never   Sexual activity: Not Currently  Other Topics Concern   Not on file  Social History Narrative   Not on file   Social Determinants of Health   Financial Resource Strain: Low Risk   (09/02/2022)   Overall Financial Resource Strain (CARDIA)    Difficulty of Paying Living Expenses: Not hard at all  Food Insecurity: No Food Insecurity (09/02/2022)   Hunger Vital Sign    Worried About Running Out of Food in the Last Year: Never true    Ran Out of Food in the Last Year: Never true  Transportation Needs: No Transportation Needs (09/02/2022)   PRAPARE - Administrator, Civil Service (Medical): No    Lack of Transportation (Non-Medical): No  Physical Activity: Inactive (09/02/2022)   Exercise Vital Sign    Days of Exercise per Week: 0 days    Minutes of Exercise per Session: 0 min  Stress: No Stress Concern Present (09/02/2022)   [employer] of Occupational Health - Occupational Stress Questionnaire    Feeling of Stress : Not at all  Social Connections: Moderately Isolated (09/02/2022)   Social Connection and Isolation Panel [NHANES]    Frequency of Communication with Friends and Family: More than three times a week    Frequency of Social Gatherings with Friends and Family: Twice a week    Attends Religious Services: Never    Database administrator or Organizations: No    Attends Engineer, structural: Never    Marital Status: Married    Tobacco Counseling Counseling given: Not Answered   Clinical Intake:  Pre-visit preparation completed: Yes  Pain : No/denies pain Pain Score: 0-No pain     Nutritional Risks: None Diabetes: No  How often do you need to have someone help you when you read instructions, pamphlets, or other written materials from your doctor or pharmacy?: 1 - Never  Interpreter Needed?: No  Information entered by :: Kennedy Bucker, LPN   Activities of Daily Living    09/02/2022   11:06 AM  In your present state of health, do you have any difficulty performing the following activities:  Hearing? 0  Vision? 0  Difficulty concentrating or making decisions? 0  Walking or climbing stairs? 0  Dressing or bathing? 0  Doing  errands, shopping? 0  Preparing Food and eating ? N  Using the Toilet? N  In the past six months, have you accidently leaked urine? N  Do you have problems with loss of bowel control? N  Managing your Medications? N  Managing your Finances? N  Housekeeping or managing your Housekeeping? N    Patient Care Team: Larae Grooms, NP as PCP - General (Nurse Practitioner)  Indicate any recent Medical Services you may have received from other than [facility] providers in the past year (date may be approximate).     Assessment:   This is a routine wellness examination for Abigail Delacruz.  Hearing/Vision screen Hearing Screening - Comments:: No aids Vision Screening - Comments:: Wears glasses- Dr.King   Dietary issues and exercise activities discussed:  Goals Addressed             This Visit's Progress    DIET - EAT MORE FRUITS AND VEGETABLES         Depression Screen    09/02/2022   11:02 AM 08/12/2022    8:23 AM 05/09/2022   10:24 AM 04/11/2022    8:19 AM 02/08/2022    8:48 AM 12/18/2021   10:43 AM 11/29/2021    1:56 PM  PHQ 2/9 Scores  PHQ - 2 Score 0  0 0 0  0  PHQ- 9 Score 0  0 2 0  1  Exception Documentation  Patient refusal    Patient refusal     Fall Risk    09/02/2022   11:06 AM 04/11/2022    8:19 AM 02/08/2022    8:48 AM 12/18/2021   10:44 AM 11/29/2021    1:52 PM  Fall Risk   Falls in the past year? 0 0 0 0 0  Number falls in past yr: 0 0 0 0 0  Injury with Fall? 0 0 0 0 0  Risk for fall due to : No Fall Risks No Fall Risks No Fall Risks No Fall Risks No Fall Risks  Follow up Falls prevention discussed;Falls evaluation completed Falls evaluation completed Falls evaluation completed Falls evaluation completed Falls evaluation completed    MEDICARE RISK AT HOME:  Medicare Risk at Home - 09/02/22 1106     Any stairs in or around the home? Yes    If so, are there any without handrails? No    Home free of loose throw rugs in walkways, pet beds, electrical cords,  etc? Yes    Adequate lighting in your home to reduce risk of falls? Yes    Life alert? No    Use of a cane, walker or w/c? No    Grab bars in the bathroom? No    Shower chair or bench in shower? No    Elevated toilet seat or a handicapped toilet? No             TIMED UP AND GO:  Was the test performed?  No    Cognitive Function:        09/02/2022   11:07 AM 08/30/2021   12:36 PM 08/07/2020    3:26 PM 07/27/2019    8:43 AM  6CIT Screen  What Year? 0 points 0 points 0 points 0 points  What month? 0 points 0 points 0 points 0 points  What time? 0 points 0 points 0 points 0 points  Count back from 20 0 points 0 points 0 points 0 points  Months in reverse 0 points 0 points 0 points 0 points  Repeat phrase 0 points 0 points 0 points 0 points  Total Score 0 points 0 points 0 points 0 points    Immunizations Immunization History  Administered Date(s) Administered   PFIZER(Purple Top)SARS-COV-2 Vaccination 06/01/2019, 06/22/2019, 01/24/2020   Pneumococcal Conjugate-13 08/26/2017   Pneumococcal Polysaccharide-23 09/18/2018    TDAP status: Due, Education has been provided regarding the importance of this vaccine. Advised may receive this vaccine at local pharmacy or Health Dept. Aware to provide a copy of the vaccination record if obtained from local pharmacy or Health Dept. Verbalized acceptance and understanding.  Flu Vaccine status: Declined, Education has been provided regarding the importance of this vaccine but patient still declined. Advised may receive this vaccine at local pharmacy or Health Dept. Aware to provide a copy of  the vaccination record if obtained from local pharmacy or Health Dept. Verbalized acceptance and understanding.  Pneumococcal vaccine status: Up to date  Covid-19 vaccine status: Completed vaccines  Qualifies for Shingles Vaccine? Yes   Zostavax completed No   Shingrix Completed?: No.    Education has been provided regarding the importance of this  vaccine. Patient has been advised to call insurance company to determine out of pocket expense if they have not yet received this vaccine. Advised may also receive vaccine at local pharmacy or Health Dept. Verbalized acceptance and understanding.  Screening Tests Health Maintenance  Topic Date Due   COVID-19 Vaccine (4 - 2023-24 season) 10/26/2021   Zoster Vaccines- Shingrix (1 of 2) 11/12/2022 (Originally 04/05/1969)   INFLUENZA VACCINE  09/26/2022   MAMMOGRAM  01/10/2023   Medicare Annual Wellness (AWV)  09/02/2023   Colonoscopy  10/21/2027   Pneumonia Vaccine 54+ Years old  Completed   DEXA SCAN  Completed   Hepatitis C Screening  Completed   HPV VACCINES  Aged Out   DTaP/Tdap/Td  Discontinued    Health Maintenance  Health Maintenance Due  Topic Date Due   COVID-19 Vaccine (4 - 2023-24 season) 10/26/2021    Colorectal cancer screening: Type of screening: Colonoscopy. Completed 10/20/17. Repeat every 10 years  Mammogram status: Completed 01/09/22. Repeat every year  Bone Density status: Completed 09/22/17. Results reflect: Bone density results: OSTEOPENIA. Repeat every 5 years.- ORDERED 09/02/22  Lung Cancer Screening: (Low Dose CT Chest recommended if Age 28-80 years, 20 pack-year currently smoking OR have quit w/in 15years.) does not qualify.    Additional Screening:  Hepatitis C Screening: does qualify; Completed 07/16/21  Vision Screening: Recommended annual ophthalmology exams for early detection of glaucoma and other disorders of the eye. Is the patient up to date with their annual eye exam?  Yes  Who is the provider or what is the name of the office in which the patient attends annual eye exams? Dr.King If pt is not established with a provider, would they like to be referred to a provider to establish care? No .   Dental Screening: Recommended annual dental exams for proper oral hygiene   Community Resource Referral / Chronic Care Management: CRR required this visit?   No   CCM required this visit?  No     Plan:     I have personally reviewed and noted the following in the patient's chart:   Medical and social history Use of alcohol, tobacco or illicit drugs  Current medications and supplements including opioid prescriptions. Patient is not currently taking opioid prescriptions. Functional ability and status Nutritional status Physical activity Advanced directives List of other physicians Hospitalizations, surgeries, and ER visits in previous 12 months Vitals Screenings to include cognitive, depression, and falls Referrals and appointments  In addition, I have reviewed and discussed with patient certain preventive protocols, quality metrics, and best practice recommendations. A written personalized care plan for preventive services as well as general preventive health recommendations were provided to patient.     Hal Hope, LPN   03/02/1094   After Visit Summary: (MyChart) Due to this being a telephonic visit, the after visit summary with patients personalized plan was offered to patient via MyChart   Nurse Notes: none

## 2022-09-02 NOTE — Patient Instructions (Signed)
Abigail Delacruz , Thank you for taking time to come for your Medicare Wellness Visit. I appreciate your ongoing commitment to your health goals. Please review the following plan we discussed and let me know if I can assist you in the future.   These are the goals we discussed:  Goals      DIET - EAT MORE FRUITS AND VEGETABLES     Exercise 3x per week (30 min per time)     Recommend walking for 30 minutes 3x a day     Patient Stated     08/07/2020, just wants to get better     Weight (lb) < 200 lb (90.7 kg)        This is a list of the screening recommended for you and due dates:  Health Maintenance  Topic Date Due   COVID-19 Vaccine (4 - 2023-24 season) 10/26/2021   Zoster (Shingles) Vaccine (1 of 2) 11/12/2022*   Flu Shot  09/26/2022   Mammogram  01/10/2023   Medicare Annual Wellness Visit  09/02/2023   Colon Cancer Screening  10/21/2027   Pneumonia Vaccine  Completed   DEXA scan (bone density measurement)  Completed   Hepatitis C Screening  Completed   HPV Vaccine  Aged Out   DTaP/Tdap/Td vaccine  Discontinued  *Topic was postponed. The date shown is not the original due date.    Advanced directives: no  Conditions/risks identified: none  Next appointment: Follow up in one year for your annual wellness visit 09/08/23 @ 10:45 am by phone   Preventive Care 65 Years and Older, Female Preventive care refers to lifestyle choices and visits with your health care provider that can promote health and wellness. What does preventive care include? A yearly physical exam. This is also called an annual well check. Dental exams once or twice a year. Routine eye exams. Ask your health care provider how often you should have your eyes checked. Personal lifestyle choices, including: Daily care of your teeth and gums. Regular physical activity. Eating a healthy diet. Avoiding tobacco and drug use. Limiting alcohol use. Practicing safe sex. Taking low-dose aspirin every day. Taking  vitamin and mineral supplements as recommended by your health care provider. What happens during an annual well check? The services and screenings done by your health care provider during your annual well check will depend on your age, overall health, lifestyle risk factors, and family history of disease. Counseling  Your health care provider may ask you questions about your: Alcohol use. Tobacco use. Drug use. Emotional well-being. Home and relationship well-being. Sexual activity. Eating habits. History of falls. Memory and ability to understand (cognition). Work and work Astronomer. Reproductive health. Screening  You may have the following tests or measurements: Height, weight, and BMI. Blood pressure. Lipid and cholesterol levels. These may be checked every 5 years, or more frequently if you are over 56 years old. Skin check. Lung cancer screening. You may have this screening every year starting at age 35 if you have a 30-pack-year history of smoking and currently smoke or have quit within the past 15 years. Fecal occult blood test (FOBT) of the stool. You may have this test every year starting at age 75. Flexible sigmoidoscopy or colonoscopy. You may have a sigmoidoscopy every 5 years or a colonoscopy every 10 years starting at age 66. Hepatitis C blood test. Hepatitis B blood test. Sexually transmitted disease (STD) testing. Diabetes screening. This is done by checking your blood sugar (glucose) after you have not  eaten for a while (fasting). You may have this done every 1-3 years. Bone density scan. This is done to screen for osteoporosis. You may have this done starting at age 43. Mammogram. This may be done every 1-2 years. Talk to your health care provider about how often you should have regular mammograms. Talk with your health care provider about your test results, treatment options, and if necessary, the need for more tests. Vaccines  Your health care provider may  recommend certain vaccines, such as: Influenza vaccine. This is recommended every year. Tetanus, diphtheria, and acellular pertussis (Tdap, Td) vaccine. You may need a Td booster every 10 years. Zoster vaccine. You may need this after age 94. Pneumococcal 13-valent conjugate (PCV13) vaccine. One dose is recommended after age 87. Pneumococcal polysaccharide (PPSV23) vaccine. One dose is recommended after age 67. Talk to your health care provider about which screenings and vaccines you need and how often you need them. This information is not intended to replace advice given to you by your health care provider. Make sure you discuss any questions you have with your health care provider. Document Released: 03/10/2015 Document Revised: 11/01/2015 Document Reviewed: 12/13/2014 Elsevier Interactive Patient Education  2017 [employer].  Fall Prevention in the Home Falls can cause injuries. They can happen to people of all ages. There are many things you can do to make your home safe and to help prevent falls. What can I do on the outside of my home? Regularly fix the edges of walkways and driveways and fix any cracks. Remove anything that might make you trip as you walk through a door, such as a raised step or threshold. Trim any bushes or trees on the path to your home. Use bright outdoor lighting. Clear any walking paths of anything that might make someone trip, such as rocks or tools. Regularly check to see if handrails are loose or broken. Make sure that both sides of any steps have handrails. Any raised decks and porches should have guardrails on the edges. Have any leaves, snow, or ice cleared regularly. Use sand or salt on walking paths during winter. Clean up any spills in your garage right away. This includes oil or grease spills. What can I do in the bathroom? Use night lights. Install grab bars by the toilet and in the tub and shower. Do not use towel bars as grab bars. Use non-skid  mats or decals in the tub or shower. If you need to sit down in the shower, use a plastic, non-slip stool. Keep the floor dry. Clean up any water that spills on the floor as soon as it happens. Remove soap buildup in the tub or shower regularly. Attach bath mats securely with double-sided non-slip rug tape. Do not have throw rugs and other things on the floor that can make you trip. What can I do in the bedroom? Use night lights. Make sure that you have a light by your bed that is easy to reach. Do not use any sheets or blankets that are too big for your bed. They should not hang down onto the floor. Have a firm chair that has side arms. You can use this for support while you get dressed. Do not have throw rugs and other things on the floor that can make you trip. What can I do in the kitchen? Clean up any spills right away. Avoid walking on wet floors. Keep items that you use a lot in easy-to-reach places. If you need to reach  something above you, use a strong step stool that has a grab bar. Keep electrical cords out of the way. Do not use floor polish or wax that makes floors slippery. If you must use wax, use non-skid floor wax. Do not have throw rugs and other things on the floor that can make you trip. What can I do with my stairs? Do not leave any items on the stairs. Make sure that there are handrails on both sides of the stairs and use them. Fix handrails that are broken or loose. Make sure that handrails are as long as the stairways. Check any carpeting to make sure that it is firmly attached to the stairs. Fix any carpet that is loose or worn. Avoid having throw rugs at the top or bottom of the stairs. If you do have throw rugs, attach them to the floor with carpet tape. Make sure that you have a light switch at the top of the stairs and the bottom of the stairs. If you do not have them, ask someone to add them for you. What else can I do to help prevent falls? Wear shoes  that: Do not have high heels. Have rubber bottoms. Are comfortable and fit you well. Are closed at the toe. Do not wear sandals. If you use a stepladder: Make sure that it is fully opened. Do not climb a closed stepladder. Make sure that both sides of the stepladder are locked into place. Ask someone to hold it for you, if possible. Clearly mark and make sure that you can see: Any grab bars or handrails. First and last steps. Where the edge of each step is. Use tools that help you move around (mobility aids) if they are needed. These include: Canes. Walkers. Scooters. Crutches. Turn on the lights when you go into a dark area. Replace any light bulbs as soon as they burn out. Set up your furniture so you have a clear path. Avoid moving your furniture around. If any of your floors are uneven, fix them. If there are any pets around you, be aware of where they are. Review your medicines with your doctor. Some medicines can make you feel dizzy. This can increase your chance of falling. Ask your doctor what other things that you can do to help prevent falls. This information is not intended to replace advice given to you by your health care provider. Make sure you discuss any questions you have with your health care provider. Document Released: 12/08/2008 Document Revised: 07/20/2015 Document Reviewed: 03/18/2014 Elsevier Interactive Patient Education  2017 [employer].

## 2022-09-17 ENCOUNTER — Encounter: Payer: Self-pay | Admitting: Nurse Practitioner

## 2022-09-19 ENCOUNTER — Telehealth: Payer: No Typology Code available for payment source | Admitting: Physician Assistant

## 2022-09-19 ENCOUNTER — Encounter: Payer: Self-pay | Admitting: Physician Assistant

## 2022-09-19 DIAGNOSIS — R748 Abnormal levels of other serum enzymes: Secondary | ICD-10-CM

## 2022-09-19 DIAGNOSIS — K760 Fatty (change of) liver, not elsewhere classified: Secondary | ICD-10-CM | POA: Diagnosis not present

## 2022-09-19 MED ORDER — TIRZEPATIDE 2.5 MG/0.5ML ~~LOC~~ SOAJ
2.5000 mg | SUBCUTANEOUS | 0 refills | Status: DC
Start: 2022-09-19 — End: 2022-11-26

## 2022-09-19 MED ORDER — TIRZEPATIDE 5 MG/0.5ML ~~LOC~~ SOAJ
5.0000 mg | SUBCUTANEOUS | 0 refills | Status: DC
Start: 2022-10-20 — End: 2022-11-26

## 2022-09-19 NOTE — Progress Notes (Signed)
     Virtual Visit via Video Note  I connected with Abigail Delacruz on 09/19/22 at  3:40 PM EDT by a video enabled telemedicine application and verified that I am speaking with the correct person using two identifiers.  Location: Patient: at work Provider: [facility], [place], [place]    I discussed the limitations of evaluation and management by telemedicine and the availability of in person appointments. The patient expressed understanding and agreed to proceed.     Chief Complaint  Patient presents with   Medication Consultation    Patient is here to discuss starting the [facility] medication.     History of Present Illness:  Patient would like to discuss starting Mounjaro for her fatty liver disease  She states she received message from pharmacy that she needs to complete prior auth/ appeal for [facility]  She states she would like to start and will pay out of pocket if she has to    Observations/Objective:  Due to the nature of the virtual visit, physical exam and observations are limited. Able to obtain the following observations:   Alert, oriented, Appears comfortable, in no acute distress.  No scleral injection, no appreciated hoarseness, tachypnea, wheeze or strider. Able to maintain conversation without visible strain.  No cough appreciated during visit.    Assessment and Plan:  Problem List Items Addressed This Visit       Digestive   Fatty liver - Primary    Chronic, ongoing concern  She has been trying to lose weight for the last several months  She is wishing to start [facility] for assistance with fatty liver and elevated enzymes Orders placed for Mounjaro 2.5 mg weekly injection for one month then increase to 5.0 mg weekly injection We reviewed side effects and administration techniques, she denies personal or family hx of thyroid disorder/cancers We reviewed prior auth and appeal process as well as potential timeline for this  Follow up in 2  months to recheck labs and assess response to medication       Relevant Medications   tirzepatide [facility]) 2.5 MG/0.5ML Pen   tirzepatide [facility]) 5 MG/0.5ML Pen (Start on 10/20/2022)     Other   Elevated alkaline phosphatase level   Relevant Medications   tirzepatide [facility]) 2.5 MG/0.5ML Pen   tirzepatide [facility]) 5 MG/0.5ML Pen (Start on 10/20/2022)     No follow-ups on file.   I, Lasaro Primm E Quang Thorpe, PA-C, have reviewed all documentation for this visit. The documentation on 09/19/22 for the exam, diagnosis, procedures, and orders are all accurate and complete.   Jacquelin Hawking, MHS, PA-C [facility]    Follow Up Instructions:    I discussed the assessment and treatment plan with the patient. The patient was provided an opportunity to ask questions and all were answered. The patient agreed with the plan and demonstrated an understanding of the instructions.   The patient was advised to call back or seek an in-person evaluation if the symptoms worsen or if the condition fails to improve as anticipated.  I provided 11 minutes of non-face-to-face time during this encounter.   Corinne Goucher E Jerrye Seebeck, PA-C

## 2022-09-19 NOTE — Assessment & Plan Note (Signed)
Chronic, ongoing concern  She has been trying to lose weight for the last several months  She is wishing to start [facility] for assistance with fatty liver and elevated enzymes Orders placed for Mounjaro 2.5 mg weekly injection for one month then increase to 5.0 mg weekly injection We reviewed side effects and administration techniques, she denies personal or family hx of thyroid disorder/cancers We reviewed prior auth and appeal process as well as potential timeline for this  Follow up in 2 months to recheck labs and assess response to medication

## 2022-09-23 ENCOUNTER — Telehealth: Payer: Self-pay

## 2022-09-23 NOTE — Telephone Encounter (Signed)
Prior authorization was initiated via CoverMyMeds for prescription of Mounjaro 2.5 MG. Awaiting determination from patient's insurance.   KEY: [id number]

## 2022-09-26 NOTE — Telephone Encounter (Signed)
Prior authorization was denied. Patient clinical notes and letter from Larae Grooms, NP has been faxed to the [facility] Department on behalf of patient.

## 2022-10-09 ENCOUNTER — Other Ambulatory Visit: Payer: Self-pay | Admitting: Nurse Practitioner

## 2022-10-09 DIAGNOSIS — E782 Mixed hyperlipidemia: Secondary | ICD-10-CM

## 2022-10-09 NOTE — Telephone Encounter (Signed)
.  Medication Refill - Medication: atorvastatin (LIPITOR) 80 MG tablet [[id number]]    Has the patient contacted their pharmacy?  No     Preferred Pharmacy?[facility] [address]), [place] - [address]) [place] [address] Phone: [phone]  Fax: [phone]     Has the patient been seen for an appointment in the last year OR does the patient have an upcoming appointment?

## 2022-10-11 NOTE — Telephone Encounter (Signed)
Called pharmacy  - closed until 9am. Pt should have a refill available.

## 2022-10-11 NOTE — Telephone Encounter (Signed)
Requested medication (s) are due for refill today: no  Requested medication (s) are on the active medication list: yes  Last refill:  08/12/22  Future visit scheduled: yes  Notes to clinic:  Unable to refill per protocol, request is too soon. Patient should have refill available at pharmacy. Pharmacy called, but dose not open until 9, routing for review.      Requested Prescriptions  Pending Prescriptions Disp Refills   atorvastatin (LIPITOR) 80 MG tablet 90 tablet 1    Sig: Take 1 tablet (80 mg total) by mouth daily.     Cardiovascular:  Antilipid - Statins Failed - 10/09/2022  2:41 PM      Failed - Lipid Panel in normal range within the last 12 months    Cholesterol, Total  Date Value Ref Range Status  08/12/2022 174 100 - 199 mg/dL Final   LDL Cholesterol (Calc)  Date Value Ref Range Status  05/27/2019 66 mg/dL (calc) Final    Comment:    Reference range: <100 . Desirable range <100 mg/dL for primary prevention;   <70 mg/dL for patients with CHD or diabetic patients  with > or = 2 CHD risk factors. Marland Kitchen LDL-C is now calculated using the Martin-Hopkins  calculation, which is a validated novel method providing  better accuracy than the Friedewald equation in the  estimation of LDL-C.  Horald Pollen et al. Lenox Ahr. 1610;960(45): 2061-2068  (http://education.QuestDiagnostics.com/faq/FAQ164)    LDL Chol Calc (NIH)  Date Value Ref Range Status  08/12/2022 96 0 - 99 mg/dL Final   HDL  Date Value Ref Range Status  08/12/2022 53 >39 mg/dL Final   Triglycerides  Date Value Ref Range Status  08/12/2022 144 0 - 149 mg/dL Final         Passed - Patient is not pregnant      Passed - Valid encounter within last 12 months    Recent Outpatient Visits           3 weeks ago Fatty liver   [place] [facility] Mecum, Oswaldo Conroy, PA-C   2 months ago Fatty liver   [place] [facility] [place], Clydie Braun, NP   3 months ago Localized swelling of right lower  leg   [place] [facility] Larae Grooms, NP   5 months ago Fatty liver   [facility] Larae Grooms, NP   6 months ago Chronic cough   [place] [facility] Larae Grooms, NP       Future Appointments             In 4 months Larae Grooms, NP [place] [facility], [facility]

## 2022-10-23 ENCOUNTER — Other Ambulatory Visit: Payer: Self-pay | Admitting: Nurse Practitioner

## 2022-10-23 DIAGNOSIS — E782 Mixed hyperlipidemia: Secondary | ICD-10-CM

## 2022-10-24 ENCOUNTER — Telehealth: Payer: Self-pay | Admitting: Nurse Practitioner

## 2022-10-24 NOTE — Telephone Encounter (Signed)
Created in error

## 2022-10-24 NOTE — Telephone Encounter (Signed)
Rx filled 08/12/22 #90, 1RF, request too soon Requested Prescriptions  Pending Prescriptions Disp Refills   atorvastatin (LIPITOR) 80 MG tablet [Pharmacy Med Name: Atorvastatin Calcium 80 MG Oral Tablet] 90 tablet 0    Sig: Take 1 tablet by mouth once daily     Cardiovascular:  Antilipid - Statins Failed - 10/23/2022  9:26 AM      Failed - Lipid Panel in normal range within the last 12 months    Cholesterol, Total  Date Value Ref Range Status  08/12/2022 174 100 - 199 mg/dL Final   LDL Cholesterol (Calc)  Date Value Ref Range Status  05/27/2019 66 mg/dL (calc) Final    Comment:    Reference range: <100 . Desirable range <100 mg/dL for primary prevention;   <70 mg/dL for patients with CHD or diabetic patients  with > or = 2 CHD risk factors. Marland Kitchen LDL-C is now calculated using the Martin-Hopkins  calculation, which is a validated novel method providing  better accuracy than the Friedewald equation in the  estimation of LDL-C.  Horald Pollen et al. Lenox Ahr. 0981;191(47): 2061-2068  (http://education.[employer].com/faq/FAQ164)    LDL Chol Calc (NIH)  Date Value Ref Range Status  08/12/2022 96 0 - 99 mg/dL Final   HDL  Date Value Ref Range Status  08/12/2022 53 >39 mg/dL Final   Triglycerides  Date Value Ref Range Status  08/12/2022 144 0 - 149 mg/dL Final         Passed - Patient is not pregnant      Passed - Valid encounter within last 12 months    Recent Outpatient Visits           1 month ago Fatty liver   [place] [facility] Mecum, Oswaldo Conroy, PA-C   2 months ago Fatty liver   [place] [facility] [place], Clydie Braun, NP   3 months ago Localized swelling of right lower leg   [place] [facility] Larae Grooms, NP   5 months ago Fatty liver   [place] [facility] Larae Grooms, NP   6 months ago Chronic cough   [place] [facility] Larae Grooms, NP       Future Appointments              In 3 months Larae Grooms, NP [place] [facility], [facility]

## 2022-10-25 ENCOUNTER — Telehealth: Payer: Self-pay | Admitting: Nurse Practitioner

## 2022-10-25 NOTE — Telephone Encounter (Signed)
Abigail Delacruz from [facility] called to see if the script for atorvastatin (LIPITOR) 80 MG tablet [Pharmacy Med Name: Atorvastatin Calcium 80 MG Oral Tablet was recvd. Adv it was recvd but not filled as it was too soon. Please f/u with pharmacy.

## 2022-10-25 NOTE — Telephone Encounter (Signed)
Call to patient- she is leaving to go out of [place] 11[place]06[place]22-11[place]22/22 and will need this medication to take with her. Is there any way she can get authorization for early fill?

## 2022-10-29 NOTE — Telephone Encounter (Signed)
Called patient to make her aware of Erin Mecum, PA recommendations. Unable to leave voice message as the voicemail box is full.   OK for [facility] to give result note if patient calls back.

## 2022-10-30 ENCOUNTER — Other Ambulatory Visit: Payer: Self-pay | Admitting: Nurse Practitioner

## 2022-10-30 DIAGNOSIS — E782 Mixed hyperlipidemia: Secondary | ICD-10-CM

## 2022-10-31 ENCOUNTER — Ambulatory Visit
Admission: RE | Admit: 2022-10-31 | Discharge: 2022-10-31 | Disposition: A | Discharge status: Home / Self Care | Payer: No Typology Code available for payment source | Source: Ambulatory Visit | Attending: Nurse Practitioner | Admitting: Nurse Practitioner

## 2022-10-31 DIAGNOSIS — E785 Hyperlipidemia, unspecified: Secondary | ICD-10-CM | POA: Diagnosis not present

## 2022-10-31 DIAGNOSIS — E663 Overweight: Secondary | ICD-10-CM | POA: Diagnosis not present

## 2022-10-31 DIAGNOSIS — Z780 Asymptomatic menopausal state: Secondary | ICD-10-CM | POA: Insufficient documentation

## 2022-10-31 DIAGNOSIS — K219 Gastro-esophageal reflux disease without esophagitis: Secondary | ICD-10-CM | POA: Diagnosis not present

## 2022-10-31 DIAGNOSIS — Z6829 Body mass index (BMI) 29.0-29.9, adult: Secondary | ICD-10-CM | POA: Diagnosis not present

## 2022-10-31 DIAGNOSIS — M8589 Other specified disorders of bone density and structure, multiple sites: Secondary | ICD-10-CM | POA: Diagnosis not present

## 2022-10-31 DIAGNOSIS — B0221 Postherpetic geniculate ganglionitis: Secondary | ICD-10-CM | POA: Diagnosis not present

## 2022-10-31 DIAGNOSIS — Z008 Encounter for other general examination: Secondary | ICD-10-CM | POA: Diagnosis not present

## 2022-10-31 NOTE — Progress Notes (Signed)
Contacted via [place]   Your bone density shows thinning bones (osteopenia) but not brittle (osteoporosis). We recommend Vitamin D supplementation of about 2,0000 IUs of over the counter Vitamin D3. In addition, we recommend a diet high in calcium with dairy and dark green leafy vegetables. We would like you to get plenty of weight bearing exercises with walking and resistance training such as light weights or resistance bands available with instructions at places such as [facility].

## 2022-10-31 NOTE — Telephone Encounter (Signed)
[facility] received refill 08/12/22. Requested Prescriptions  Refused Prescriptions Disp Refills   atorvastatin (LIPITOR) 80 MG tablet [Pharmacy Med Name: Atorvastatin Calcium 80 MG Oral Tablet] 90 tablet 0    Sig: Take 1 tablet by mouth once daily     Cardiovascular:  Antilipid - Statins Failed - 10/30/2022  2:52 PM      Failed - Lipid Panel in normal range within the last 12 months    Cholesterol, Total  Date Value Ref Range Status  08/12/2022 174 100 - 199 mg/dL Final   LDL Cholesterol (Calc)  Date Value Ref Range Status  05/27/2019 66 mg/dL (calc) Final    Comment:    Reference range: <100 . Desirable range <100 mg/dL for primary prevention;   <70 mg/dL for patients with CHD or diabetic patients  with > or = 2 CHD risk factors. Marland Kitchen LDL-C is now calculated using the Martin-Hopkins  calculation, which is a validated novel method providing  better accuracy than the Friedewald equation in the  estimation of LDL-C.  Horald Pollen et al. Lenox Ahr. 7564;332(95): 2061-2068  (http://education.[employer].com/faq/FAQ164)    LDL Chol Calc (NIH)  Date Value Ref Range Status  08/12/2022 96 0 - 99 mg/dL Final   HDL  Date Value Ref Range Status  08/12/2022 53 >39 mg/dL Final   Triglycerides  Date Value Ref Range Status  08/12/2022 144 0 - 149 mg/dL Final         Passed - Patient is not pregnant      Passed - Valid encounter within last 12 months    Recent Outpatient Visits           1 month ago Fatty liver   [place] [facility] Mecum, Oswaldo Conroy, PA-C   2 months ago Fatty liver   [place] [facility] [place], Clydie Braun, NP   3 months ago Localized swelling of right lower leg   [place] [facility] Larae Grooms, NP   5 months ago Fatty liver   [place] [facility] Larae Grooms, NP   6 months ago Chronic cough   [place] [facility] Larae Grooms, NP       Future Appointments              In 3 months Larae Grooms, NP [place] [facility], [facility]

## 2022-11-24 ENCOUNTER — Other Ambulatory Visit: Payer: Self-pay | Admitting: Physician Assistant

## 2022-11-24 DIAGNOSIS — K760 Fatty (change of) liver, not elsewhere classified: Secondary | ICD-10-CM

## 2022-11-24 DIAGNOSIS — R748 Abnormal levels of other serum enzymes: Secondary | ICD-10-CM

## 2022-11-26 ENCOUNTER — Telehealth: Payer: Self-pay

## 2022-11-26 ENCOUNTER — Ambulatory Visit
Admission: RE | Admit: 2022-11-26 | Discharge: 2022-11-26 | Disposition: A | Discharge status: Home / Self Care | Payer: No Typology Code available for payment source | Source: Ambulatory Visit | Attending: Pediatrics | Admitting: Pediatrics

## 2022-11-26 ENCOUNTER — Ambulatory Visit (INDEPENDENT_AMBULATORY_CARE_PROVIDER_SITE_OTHER): Payer: No Typology Code available for payment source | Admitting: Pediatrics

## 2022-11-26 ENCOUNTER — Telehealth: Payer: Self-pay | Admitting: Nurse Practitioner

## 2022-11-26 VITALS — BP 128/77 | HR 68 | Ht 63.0 in | Wt 158.0 lb

## 2022-11-26 DIAGNOSIS — K760 Fatty (change of) liver, not elsewhere classified: Secondary | ICD-10-CM

## 2022-11-26 DIAGNOSIS — R1011 Right upper quadrant pain: Secondary | ICD-10-CM | POA: Insufficient documentation

## 2022-11-26 DIAGNOSIS — K7689 Other specified diseases of liver: Secondary | ICD-10-CM | POA: Diagnosis not present

## 2022-11-26 DIAGNOSIS — R748 Abnormal levels of other serum enzymes: Secondary | ICD-10-CM

## 2022-11-26 MED ORDER — TIRZEPATIDE 7.5 MG/0.5ML ~~LOC~~ SOAJ: 7.5000 mg | SUBCUTANEOUS | 3 refills | Status: DC

## 2022-11-26 NOTE — Telephone Encounter (Signed)
Medication Refill - Medication: tirzepatide (MOUNJARO) 7.5 MG/0.5ML Pen  Pt states pharmacy doesn't hav eit, shows received at 11:45 today  Has the patient contacted their pharmacy? No/pharmacy called pt  ((Agent: If yes, when and what did the pharmacy advise?)contact pcp  Preferred Pharmacy (with phone number or street name):  [facility] - [place], [place] - [address] Phone: [phone]  Fax: [phone]     Has the patient been seen for an appointment in the last year OR does the patient have an upcoming appointment? yes  Agent: Please be advised that RX refills may take up to 3 business days. We ask that you follow-up with your pharmacy.

## 2022-11-26 NOTE — Patient Instructions (Addendum)
Plan today:  Labs and ultrasound, will let you know the results. I sent the mounjaro 7.5mg  to your pharmacy.   Ultrasound today at 1pm at: [address] [place] [address]  DO NOT EAT OR DRINK ANYTHING BEFORE THE ULTRASOUND

## 2022-11-26 NOTE — Telephone Encounter (Signed)
Victorino Dike, [place] Tech from [facility] imaging dept calling to report results from US Abdomen today.  Advised I would send to practice for review.   IMPRESSION: 1. Multiple hepatic cysts, some with internal septations, not significantly changed when compared with prior exams. 2. Hepatic steatosis.

## 2022-11-26 NOTE — Telephone Encounter (Signed)
[facility] called and spoke to [place] about the refill(s) mounjaro requested. Advised it was sent on 11/26/22 and the patient is saying it was not received. She says there is a note saying a prior authorization is needed. Advised I will send to the provider.

## 2022-11-26 NOTE — Progress Notes (Signed)
Acute Visit  BP 128/77   Pulse 68   Ht 5\' 3"  (1.6 m)   Wt 158 lb (71.7 kg)   SpO2 97%   BMI 27.99 kg/m    Subjective:    Patient ID: Abigail Delacruz, female    DOB: January 04, 1951, 72 y.o.   MRN: [id number]  HPI: Abigail Delacruz is a 72 y.o. female  Chief Complaint  Patient presents with   Abdominal Pain    RUQ, radiates to back, concerned about her liver; has not followed up with GI; denies n/v/d or other symptoms   #RUQ pain Reports has been going on for months but recently worsening She is not sure if related to eating Does not wake her from sleep Denies any nausea, vomiting, coffee ground emesis, hematochezia, melena Previously noted to have cysts Has GERD, well controlled  #Fatty liver On mounjaro 5mg  Continues to have weight loss, see trend below Interested in higher dose Wt Readings from Last 3 Encounters:  11/26/22 158 lb (71.7 kg)  09/02/22 161 lb (73 kg)  08/12/22 161 lb (73 kg)   Relevant past medical, surgical, family and social history reviewed and updated as indicated. Interim medical history since our last visit reviewed. Allergies and medications reviewed and updated.  ROS per HPI unless specifically indicated above     Objective:    BP 128/77   Pulse 68   Ht 5\' 3"  (1.6 m)   Wt 158 lb (71.7 kg)   SpO2 97%   BMI 27.99 kg/m   Wt Readings from Last 3 Encounters:  11/26/22 158 lb (71.7 kg)  09/02/22 161 lb (73 kg)  08/12/22 161 lb (73 kg)    Physical Exam Constitutional:      General: She is not in acute distress.    Appearance: Normal appearance. She is normal weight. She is not ill-appearing.  Pulmonary:     Effort: Pulmonary effort is normal.  Abdominal:     Palpations: Abdomen is soft. There is no fluid wave or hepatomegaly.     Tenderness: There is abdominal tenderness in the right upper quadrant. There is no guarding or rebound. Positive signs include Murphy's sign.  Musculoskeletal:        General: Normal range of motion.   Skin:    Comments: Normal skin color  Neurological:     General: No focal deficit present.     Mental Status: She is alert. Mental status is at baseline.  Psychiatric:        Mood and Affect: Mood normal.        Behavior: Behavior normal.        Thought Content: Thought content normal.       Assessment & Plan:  Assessment & Plan   RUQ pain Elevated alkaline phosphatase level Based on presentation highest suspcicion for gallstones. Unclear if having biliary colic, as she does not notice. Does have h/o elevated alkphos and known hepatic cysts & NAFLD. Will repeat labs and imaging as below. Pt given strict return precautions and anticipatory guidance for ED. -     Comprehensive metabolic panel -     US ABDOMEN LIMITED RUQ (LIVER/GB); Future -     Lipase  Fatty liver Ongoing weight loss. Will increase dose today. Will check lipase as above. -     Tirzepatide; Inject 7.5 mg into the skin once a week.  Dispense: 2 mL; Refill: 3  Follow up plan: Return if symptoms worsen or fail to improve.  Noemi Bellissimo P  Evelene Croon, MD

## 2022-11-27 ENCOUNTER — Other Ambulatory Visit: Payer: Self-pay | Admitting: Pediatrics

## 2022-11-27 DIAGNOSIS — K760 Fatty (change of) liver, not elsewhere classified: Secondary | ICD-10-CM

## 2022-11-27 DIAGNOSIS — K7689 Other specified diseases of liver: Secondary | ICD-10-CM

## 2022-11-27 LAB — COMPREHENSIVE METABOLIC PANEL
ALT: 31 [IU]/L (ref 0–32)
AST: 29 [IU]/L (ref 0–40)
Albumin: 4.4 g/dL (ref 3.8–4.8)
Alkaline Phosphatase: 144 [IU]/L — ABNORMAL HIGH (ref 44–121)
BUN/Creatinine Ratio: 22 (ref 12–28)
BUN: 17 mg/dL (ref 8–27)
Bilirubin Total: 0.5 mg/dL (ref 0.0–1.2)
CO2: 25 mmol/L (ref 20–29)
Calcium: 9.8 mg/dL (ref 8.7–10.3)
Chloride: 101 mmol/L (ref 96–106)
Creatinine, Ser: 0.78 mg/dL (ref 0.57–1.00)
Globulin, Total: 2.7 g/dL (ref 1.5–4.5)
Glucose: 80 mg/dL (ref 70–99)
Potassium: 4.1 mmol/L (ref 3.5–5.2)
Sodium: 142 mmol/L (ref 134–144)
Total Protein: 7.1 g/dL (ref 6.0–8.5)
eGFR: 81 mL/min/{1.73_m2} (ref >59)

## 2022-11-27 LAB — LIPASE: Lipase: 35 U/L (ref 14–85)

## 2022-11-28 ENCOUNTER — Telehealth: Payer: Self-pay | Admitting: Nurse Practitioner

## 2022-11-28 ENCOUNTER — Other Ambulatory Visit: Payer: Self-pay | Admitting: Pediatrics

## 2022-11-28 DIAGNOSIS — Z1231 Encounter for screening mammogram for malignant neoplasm of breast: Secondary | ICD-10-CM

## 2022-11-28 NOTE — Telephone Encounter (Signed)
Patient is confused about her lab results.  She is going to call her gastro and schedule an appointment.  Please advise regarding results.

## 2022-11-28 NOTE — Telephone Encounter (Signed)
Copied from CRM [id number]. Topic: Referral - Question >> Nov 28, 2022  3:04 PM Haroldine Laws wrote: Reason for CRM: pt called [facility] because that was where her referral was sent to and they told her she need to go to a GI doctor.  Pt states she is in pain and wants to see someone asap.  CB#  [phone]

## 2022-12-03 ENCOUNTER — Telehealth: Payer: Self-pay

## 2022-12-03 NOTE — Telephone Encounter (Signed)
Pt does not sound familiar

## 2022-12-03 NOTE — Telephone Encounter (Signed)
Patient left a message on voicemail this afternoon around 3:50pm. Stating she is calling to make a appointment with Dr. Servando Snare and she had call two other times and left messages and no one has called her back. Return patient call and she states she left a message on Friday and then on Monday. She states she is having epigastric pain, pain under her right breast that is worse in the afternoons. She states it radiates to her back. She states they did a ultrasound and it showed she had a cyst on her liver. Made appointment for Dr. Servando Snare on 10/31 but she wants to know what she can do till appointment

## 2022-12-04 ENCOUNTER — Ambulatory Visit: Payer: Self-pay

## 2022-12-04 NOTE — Telephone Encounter (Signed)
Chief Complaint: Abdominal pain RUQ around to back Symptoms: pain level 9/10  Frequency: onset 2 months Pertinent Negatives: Patient denies other symptoms Disposition: [] ED /[] Urgent Care (no appt availability in office) / [x] Appointment(In office/virtual)/ []  [facility]/ [] Home Care/ [] Refused Recommended Disposition /[] [facility]/ []  Follow-up with PCP Additional Notes: Patient says she is only relieved by taking 400 mg ibuprofen and a heating pad. Patient says she was told by Dr. Evelene Croon to call and schedule OV, scheduled with Dr. Laural Benes tomorrow.   Reason for Disposition  [1] MODERATE pain (e.g., interferes with normal activities) AND [2] pain comes and goes (cramps) AND [3] present > 24 hours  (Exception: Pain with Vomiting or Diarrhea - see that Guideline.)  Answer Assessment - Initial Assessment Questions 1. LOCATION: "Where does it hurt?"      Under right 2. RADIATION: "Does the pain shoot anywhere else?" (e.g., chest, back)     To the back 3. ONSET: "When did the pain begin?" (e.g., minutes, hours or days ago)      Ongoing at least 2 months 4. PATTERN "Does the pain come and go, or is it constant?"    - If it comes and goes: "How long does it last?" "Do you have pain now?"     (Note: Comes and goes means the pain is intermittent. It goes away completely between bouts.)    - If constant: "Is it getting better, staying the same, or getting worse?"      (Note: Constant means the pain never goes away completely; most serious pain is constant and gets worse.)      Constant 6. SEVERITY: "How bad is the pain?"  (e.g., Scale 1-10; mild, moderate, or severe)    - MILD (1-3): Doesn't interfere with normal activities, abdomen soft and not tender to touch.     - MODERATE (4-7): Interferes with normal activities or awakens from sleep, abdomen tender to touch.     - SEVERE (8-10): Excruciating pain, doubled over, unable to do any normal activities.       9  7.  RELIEVING/AGGRAVATING FACTORS: "What makes it better or worse?" (e.g., antacids, bending or twisting motion, bowel movement)     Heating pad 8. OTHER SYMPTOMS: "Do you have any other symptoms?" (e.g., back pain, diarrhea, fever, urination pain, vomiting)       No  Protocols used: Abdominal Pain - Female-A-AH

## 2022-12-04 NOTE — Telephone Encounter (Signed)
Pt is aware as instructed and states she will call PCP to be evaluated in the meantime

## 2022-12-05 ENCOUNTER — Ambulatory Visit (INDEPENDENT_AMBULATORY_CARE_PROVIDER_SITE_OTHER): Payer: No Typology Code available for payment source | Admitting: Family Medicine

## 2022-12-05 ENCOUNTER — Encounter: Payer: Self-pay | Admitting: Family Medicine

## 2022-12-05 ENCOUNTER — Inpatient Hospital Stay: Admission: RE | Admit: 2022-12-05 | Payer: No Typology Code available for payment source | Source: Ambulatory Visit

## 2022-12-05 VITALS — BP 117/78 | HR 69 | Ht 63.0 in | Wt 158.2 lb

## 2022-12-05 DIAGNOSIS — R1011 Right upper quadrant pain: Secondary | ICD-10-CM | POA: Insufficient documentation

## 2022-12-05 NOTE — Assessment & Plan Note (Signed)
Chronic over the last 2-3 months. Predates starting mounjaro, so less concerned that it is causing her pain. She denies that it has changed with starting that medicine. US showed fatty liver and unchanged hepatic cysts. She has an appointment with GI in 3 weeks. Will obtain HIDA to r/o dyskinetic gall bladder prior to her appointment with them. Concern for muscular issue given improvement with heating pad and ibuprofen. Will get her into PT. Call with any concerns. Continue to monitor.

## 2022-12-05 NOTE — Progress Notes (Signed)
BP 117/78   Pulse 69   Ht 5\' 3"  (1.6 m)   Wt 158 lb 3.2 oz (71.8 kg)   SpO2 99%   BMI 28.02 kg/m    Subjective:    Patient ID: Abigail Delacruz, female    DOB: 09-Sep-1950, 72 y.o.   MRN: [id number]  HPI: Abigail Delacruz is a 72 y.o. female  Chief Complaint  Patient presents with   Flank Pain    Patient says she has been having pain on her R side that radiates around to her mid-back. Patient says she has had a ultrasound on the area of concern. Patient says she was told she had a cyst on her liver. Patient says she has a heating pad that she used in the afternoon, and it helps her a little bit. Patient is concerned about fatty liver and would like to discuss next steps.    ABDOMINAL PAIN- had been going on prior to her starting her mounjaro. Has not gotten worse since starting it, she notes that it is happening mainly in the PM and is improving with the heating pad. She took 1/2 an ibuprofen 1x and that also really helped Duration: 2-3 months Onset: sudden Severity: severe, 9/10 Quality: twisting, "pain" Location:  RUQ  Episode duration: until she gets on the heating pad at home Radiation: into her back Frequency: intermittent, in the PM  Status: stable Treatments attempted: p Fever: no Nausea: no Vomiting: no Weight loss: due to medicine Decreased appetite: due to medicine Diarrhea: no Constipation: yes Blood in stool: no Heartburn: no Jaundice: no Rash: no Dysuria/urinary frequency: no Hematuria: no History of sexually transmitted disease: no Recurrent NSAID use: no   Relevant past medical, surgical, family and social history reviewed and updated as indicated. Interim medical history since our last visit reviewed. Allergies and medications reviewed and updated.  Review of Systems  Constitutional: Negative.   Respiratory: Negative.    Cardiovascular: Negative.   Gastrointestinal:  Positive for abdominal pain and constipation. Negative for abdominal distention,  anal bleeding, blood in stool, diarrhea, nausea, rectal pain and vomiting.  Musculoskeletal: Negative.   Skin: Negative.   Psychiatric/Behavioral: Negative.      Per HPI unless specifically indicated above     Objective:    BP 117/78   Pulse 69   Ht 5\' 3"  (1.6 m)   Wt 158 lb 3.2 oz (71.8 kg)   SpO2 99%   BMI 28.02 kg/m   Wt Readings from Last 3 Encounters:  12/05/22 158 lb 3.2 oz (71.8 kg)  11/26/22 158 lb (71.7 kg)  09/02/22 161 lb (73 kg)    Physical Exam Vitals and nursing note reviewed.  Constitutional:      General: She is not in acute distress.    Appearance: Normal appearance. She is not ill-appearing, toxic-appearing or diaphoretic.  HENT:     Head: Normocephalic and atraumatic.     Right Ear: External ear normal.     Left Ear: External ear normal.     Nose: Nose normal.     Mouth/Throat:     Mouth: Mucous membranes are moist.     Pharynx: Oropharynx is clear.  Eyes:     General: No scleral icterus.       Right eye: No discharge.        Left eye: No discharge.     Extraocular Movements: Extraocular movements intact.     Conjunctiva/sclera: Conjunctivae normal.     Pupils: Pupils are equal, round,  and reactive to light.  Cardiovascular:     Rate and Rhythm: Normal rate and regular rhythm.     Pulses: Normal pulses.     Heart sounds: Normal heart sounds. No murmur heard.    No friction rub. No gallop.  Pulmonary:     Effort: Pulmonary effort is normal. No respiratory distress.     Breath sounds: Normal breath sounds. No stridor. No wheezing, rhonchi or rales.  Chest:     Chest wall: No tenderness.  Abdominal:     General: Abdomen is flat. Bowel sounds are normal. There is no distension.     Palpations: Abdomen is soft. There is no mass.     Tenderness: There is no abdominal tenderness. There is no right CVA tenderness, left CVA tenderness, guarding or rebound.     Hernia: No hernia is present.  Musculoskeletal:        General: Normal range of motion.      Cervical back: Normal range of motion and neck supple.  Skin:    General: Skin is warm and dry.     Capillary Refill: Capillary refill takes less than 2 seconds.     Coloration: Skin is not jaundiced or pale.     Findings: No bruising, erythema, lesion or rash.  Neurological:     General: No focal deficit present.     Mental Status: She is alert and oriented to person, place, and time. Mental status is at baseline.  Psychiatric:        Mood and Affect: Mood normal.        Behavior: Behavior normal.        Thought Content: Thought content normal.        Judgment: Judgment normal.     Results for orders placed or performed in visit on 11/26/22  Comp Met (CMET)  Result Value Ref Range   Glucose 80 70 - 99 mg/dL   BUN 17 8 - 27 mg/dL   Creatinine, Ser 1.61 0.57 - 1.00 mg/dL   eGFR 81 >09 UE/AVW/0.98   BUN/Creatinine Ratio 22 12 - 28   Sodium 142 134 - 144 mmol/L   Potassium 4.1 3.5 - 5.2 mmol/L   Chloride 101 96 - 106 mmol/L   CO2 25 20 - 29 mmol/L   Calcium 9.8 8.7 - 10.3 mg/dL   Total Protein 7.1 6.0 - 8.5 g/dL   Albumin 4.4 3.8 - 4.8 g/dL   Globulin, Total 2.7 1.5 - 4.5 g/dL   Bilirubin Total 0.5 0.0 - 1.2 mg/dL   Alkaline Phosphatase 144 (H) 44 - 121 IU/L   AST 29 0 - 40 IU/L   ALT 31 0 - 32 IU/L  Lipase  Result Value Ref Range   Lipase 35 14 - 85 U/L      Assessment & Plan:   Problem List Items Addressed This Visit       Other   RUQ pain - Primary    Chronic over the last 2-3 months. Predates starting mounjaro, so less concerned that it is causing her pain. She denies that it has changed with starting that medicine. US showed fatty liver and unchanged hepatic cysts. She has an appointment with GI in 3 weeks. Will obtain HIDA to r/o dyskinetic gall bladder prior to her appointment with them. Concern for muscular issue given improvement with heating pad and ibuprofen. Will get her into PT. Call with any concerns. Continue to monitor.       Relevant Orders  NM  Hepato W/Eject Fract   Ambulatory referral to Physical Therapy     Follow up plan: Return if symptoms worsen or fail to improve.

## 2022-12-10 ENCOUNTER — Encounter
Admission: RE | Admit: 2022-12-10 | Discharge: 2022-12-10 | Disposition: A | Discharge status: Home / Self Care | Payer: No Typology Code available for payment source | Source: Ambulatory Visit | Attending: Family Medicine | Admitting: Family Medicine

## 2022-12-10 DIAGNOSIS — R1011 Right upper quadrant pain: Secondary | ICD-10-CM | POA: Insufficient documentation

## 2022-12-10 MED ORDER — TECHNETIUM TC 99M MEBROFENIN IV KIT
5.4400 | PACK | Freq: Once | INTRAVENOUS | Status: AC | PRN
  Administered 2022-12-10: 5.44 via INTRAVENOUS

## 2022-12-11 ENCOUNTER — Other Ambulatory Visit: Payer: Self-pay | Admitting: Pediatrics

## 2022-12-11 ENCOUNTER — Ambulatory Visit: Payer: Self-pay

## 2022-12-11 DIAGNOSIS — R399 Unspecified symptoms and signs involving the genitourinary system: Secondary | ICD-10-CM

## 2022-12-11 NOTE — Telephone Encounter (Signed)
Imaging done yes Wanting bladder test- stated she had a friend that with similar sx and was tx with abx and sx went away.  Request physician interpret results and to discuss with pt options/urine test  Pain 8-9 feels like if a needle was put through her chest to the back  Feels mucus back of throat. Reason for Disposition  [1] Caller requesting NON-URGENT health information AND [2] PCP's office is the best resource  Answer Assessment - Initial Assessment Questions 1. REASON FOR CALL or QUESTION: "What is your reason for calling today?" or "How can I best help you?" or "What question do you have that I can help answer?"     Has congestion Advised Robitussin or Mucinex and drink plenty of fluids and humidifier  Protocols used: Information Only Call - No Triage-A-AH

## 2022-12-11 NOTE — Progress Notes (Signed)
U/a and culture future order placed  Abigail Delacruz Pringle, MD

## 2022-12-11 NOTE — Telephone Encounter (Signed)
Patient will need an appointment to discuss with provider before medications can be sent in.

## 2022-12-12 ENCOUNTER — Telehealth: Payer: Self-pay | Admitting: Nurse Practitioner

## 2022-12-12 ENCOUNTER — Other Ambulatory Visit: Payer: No Typology Code available for payment source

## 2022-12-12 DIAGNOSIS — R399 Unspecified symptoms and signs involving the genitourinary system: Secondary | ICD-10-CM | POA: Diagnosis not present

## 2022-12-12 LAB — URINALYSIS, ROUTINE W REFLEX MICROSCOPIC
Bilirubin, UA: NEGATIVE
Glucose, UA: NEGATIVE
Ketones, UA: NEGATIVE
Nitrite, UA: NEGATIVE
Protein,UA: NEGATIVE
Specific Gravity, UA: 1.025 (ref 1.005–1.030)
Urobilinogen, Ur: 0.2 mg/dL (ref 0.2–1.0)
pH, UA: 5.5 (ref 5.0–7.5)

## 2022-12-12 LAB — MICROSCOPIC EXAMINATION: Bacteria, UA: NONE SEEN

## 2022-12-12 NOTE — Telephone Encounter (Signed)
Made patient aware of Dr Evelene Croon recommendations via MyChart. Advised patient to reach out if the she has any questions or concerns.

## 2022-12-12 NOTE — Telephone Encounter (Signed)
Patient came in for labs.

## 2022-12-12 NOTE — Telephone Encounter (Signed)
Patient called said she got her results and she need the medication according to the results she got. Please f/u with patient

## 2022-12-14 LAB — URINE CULTURE

## 2022-12-18 DIAGNOSIS — M545 Low back pain, unspecified: Secondary | ICD-10-CM | POA: Diagnosis not present

## 2022-12-18 DIAGNOSIS — M546 Pain in thoracic spine: Secondary | ICD-10-CM | POA: Diagnosis not present

## 2022-12-26 ENCOUNTER — Ambulatory Visit: Payer: No Typology Code available for payment source | Admitting: Gastroenterology

## 2023-01-08 MED ORDER — TIRZEPATIDE 10 MG/0.5ML ~~LOC~~ SOAJ: 10.0000 mg | SUBCUTANEOUS | 2 refills | Status: DC

## 2023-01-10 ENCOUNTER — Telehealth: Payer: Self-pay

## 2023-01-10 NOTE — Telephone Encounter (Addendum)
Prior Berkley Harvey has been denied based on not having DM type 2 and using medication for weight loss indication instead.

## 2023-01-10 NOTE — Telephone Encounter (Signed)
Medication prior auth for  Drug Mounjaro 10MG /0.5ML auto-injectors Submitted to insurance via covermymeds portal ([id number])

## 2023-01-13 ENCOUNTER — Ambulatory Visit
Admission: RE | Admit: 2023-01-13 | Discharge: 2023-01-13 | Disposition: A | Discharge status: Home / Self Care | Payer: No Typology Code available for payment source | Source: Ambulatory Visit | Attending: Pediatrics | Admitting: Pediatrics

## 2023-01-13 DIAGNOSIS — Z1231 Encounter for screening mammogram for malignant neoplasm of breast: Secondary | ICD-10-CM | POA: Diagnosis not present

## 2023-02-10 NOTE — Progress Notes (Signed)
BP 112/79 (BP Location: Left Arm, Patient Position: Sitting, Cuff Size: Large)   Pulse 75   Temp 97.7 F (36.5 C) (Oral)   Ht 5\' 2"  (1.575 m)   Wt 159 lb 3.2 oz (72.2 kg)   SpO2 96%   BMI 29.12 kg/m    Subjective:    Patient ID: Abigail Delacruz, female    DOB: 1950-11-14, 72 y.o.   MRN: [id number]  HPI: Abigail Delacruz is a 72 y.o. female presenting on 02/11/2023 for comprehensive medical examination. Current medical complaints include: dizziness  She currently lives with: Menopausal Symptoms: no  HYPERLIPIDEMIA Hyperlipidemia status: excellent compliance Satisfied with current treatment?  yes Side effects:  no Medication compliance: excellent compliance Past cholesterol meds: atorvastain (lipitor) Supplements: none Aspirin:  no The 10-year ASCVD risk score (Arnett DK, et al., 2019) is: 8.9%   Values used to calculate the score:     Age: 46 years     Sex: Female     Is Non-Hispanic African American: No     Diabetic: No     Tobacco smoker: No     Systolic Blood Pressure: 112 mmHg     Is BP treated: No     HDL Cholesterol: 53 mg/dL     Total Cholesterol: 174 mg/dL Chest pain:  no Coronary artery disease:  no Family history CAD:  no Family history early CAD:  no   Depression Screen done today and results listed below:     02/11/2023    8:49 AM 12/05/2022    9:49 AM 09/02/2022   11:02 AM 05/09/2022   10:24 AM 04/11/2022    8:19 AM  Depression screen PHQ 2/9  Decreased Interest 0 0 0 0 0  Down, Depressed, Hopeless 0 0 0 0 0  PHQ - 2 Score 0 0 0 0 0  Altered sleeping 0 0 0 0 0  Tired, decreased energy 0 0 0 0 0  Change in appetite 0 0 0 0 0  Feeling bad or failure about yourself  0 0 0 0 0  Trouble concentrating 0 0 0 0 0  Moving slowly or fidgety/restless 0 0 0 0 2  Suicidal thoughts 0 0 0 0 0  PHQ-9 Score 0 0 0 0 2  Difficult doing work/chores  Not difficult at all Not difficult at all Not difficult at all Somewhat difficult    The patient does not have  a history of falls. I did complete a risk assessment for falls. A plan of care for falls was documented.   Past Medical History:  Past Medical History:  Diagnosis Date   Cataract    Hyperlipidemia     Surgical History:  Past Surgical History:  Procedure Laterality Date   APPENDECTOMY  1971   CATARACT EXTRACTION W/PHACO Right 09/08/2017   Procedure: CATARACT EXTRACTION PHACO AND INTRAOCULAR LENS PLACEMENT (IOC)  RIGHT;  Surgeon: Abigail Crane, MD;  Location: [facility];  Service: Ophthalmology;  Laterality: Right;   CATARACT EXTRACTION W/PHACO Left 09/29/2017   Procedure: CATARACT EXTRACTION PHACO AND INTRAOCULAR LENS PLACEMENT (IOC) LEFT;  Surgeon: Abigail Crane, MD;  Location: [facility];  Service: Ophthalmology;  Laterality: Left;   COLONOSCOPY WITH PROPOFOL N/A 10/20/2017   Procedure: COLONOSCOPY WITH PROPOFOL;  Surgeon: Abigail Minium, MD;  Location: [facility];  Service: Endoscopy;  Laterality: N/A;    Medications:  Current Outpatient Medications on File Prior to Visit  Medication Sig   CALCIUM-MAGNESIUM-ZINC PO Take 1 tablet  by mouth daily.   Cholecalciferol (VITAMIN D3) 1.25 MG (50000 UT) TABS Take by mouth.   Multiple Vitamins-Minerals (MULTIVITAMIN ADULT) CHEW Chew by mouth.   No current facility-administered medications on file prior to visit.    Allergies:  Allergies  Allergen Reactions   Ciprodex [Ciprofloxacin-Dexamethasone]     Pt states that this ear drop she feels caused her swelling.     Social History:  Social History   Socioeconomic History   Marital status: Married    Spouse name: Not on file   Number of children: 2   Years of education: Not on file   Highest education level: Associate degree: occupational, Scientist, product/process development, or vocational program  Occupational History   Not on file  Tobacco Use   Smoking status: Former    Current packs/day: 0.00    Types: Cigarettes    Quit date: 1973    Years since quitting: 51.9    Smokeless tobacco: Never  Vaping Use   Vaping status: Never Used  Substance and Sexual Activity   Alcohol use: Yes    Comment: social 1x/yr   Drug use: Never   Sexual activity: Not Currently  Other Topics Concern   Not on file  Social History Narrative   Not on file   Social Drivers of Health   Financial Resource Strain: Low Risk  (11/26/2022)   Overall Financial Resource Strain (CARDIA)    Difficulty of Paying Living Expenses: Not hard at all  Food Insecurity: No Food Insecurity (11/26/2022)   Hunger Vital Sign    Worried About Running Out of Food in the Last Year: Never true    Ran Out of Food in the Last Year: Never true  Transportation Needs: No Transportation Needs (11/26/2022)   PRAPARE - Administrator, Civil Service (Medical): No    Lack of Transportation (Non-Medical): No  Physical Activity: Inactive (11/26/2022)   Exercise Vital Sign    Days of Exercise per Week: 0 days    Minutes of Exercise per Session: 0 min  Stress: No Stress Concern Present (11/26/2022)   [employer] of Occupational Health - Occupational Stress Questionnaire    Feeling of Stress : Only a little  Social Connections: Moderately Integrated (11/26/2022)   Social Connection and Isolation Panel [NHANES]    Frequency of Communication with Friends and Family: More than three times a week    Frequency of Social Gatherings with Friends and Family: Once a week    Attends Religious Services: More than 4 times per year    Active Member of [employer] or Organizations: No    Attends Banker Meetings: Never    Marital Status: Married  Recent Concern: Social Connections - Moderately Isolated (09/02/2022)   Social Connection and Isolation Panel [NHANES]    Frequency of Communication with Friends and Family: More than three times a week    Frequency of Social Gatherings with Friends and Family: Twice a week    Attends Religious Services: Never    Database administrator or Organizations: No     Attends Banker Meetings: Never    Marital Status: Married  Catering manager Violence: Not At Risk (09/02/2022)   Humiliation, Afraid, Rape, and Kick questionnaire    Fear of Current or Ex-Partner: No    Emotionally Abused: No    Physically Abused: No    Sexually Abused: No   Social History   Tobacco Use  Smoking Status Former   Current packs/day: 0.00  Types: Cigarettes   Quit date: 50   Years since quitting: 51.9  Smokeless Tobacco Never   Social History   Substance and Sexual Activity  Alcohol Use Yes   Comment: social 1x/yr    Family History:  Family History  Problem Relation Age of Onset   Kidney failure Mother    Cirrhosis Father    Alcohol abuse Father    Diabetes Brother    Hypertension Brother    Arthritis Brother    Breast cancer Neg Hx     Past medical history, surgical history, medications, allergies, family history and social history reviewed with patient today and changes made to appropriate areas of the chart.   Review of Systems  Eyes:  Negative for blurred vision and double vision.  Respiratory:  Negative for shortness of breath.   Cardiovascular:  Negative for chest pain, palpitations and leg swelling.  Neurological:  Negative for dizziness and headaches.   All other ROS negative except what is listed above and in the HPI.      Objective:    BP 112/79 (BP Location: Left Arm, Patient Position: Sitting, Cuff Size: Large)   Pulse 75   Temp 97.7 F (36.5 C) (Oral)   Ht 5\' 2"  (1.575 m)   Wt 159 lb 3.2 oz (72.2 kg)   SpO2 96%   BMI 29.12 kg/m   Wt Readings from Last 3 Encounters:  02/11/23 159 lb 3.2 oz (72.2 kg)  12/05/22 158 lb 3.2 oz (71.8 kg)  11/26/22 158 lb (71.7 kg)    Physical Exam Vitals and nursing note reviewed.  Constitutional:      General: She is awake. She is not in acute distress.    Appearance: Normal appearance. She is well-developed. She is not ill-appearing.  HENT:     Head: Normocephalic and  atraumatic.     Right Ear: Hearing, tympanic membrane, ear canal and external ear normal. No drainage.     Left Ear: Hearing, tympanic membrane, ear canal and external ear normal. No drainage.     Nose: Nose normal.     Right Sinus: No maxillary sinus tenderness or frontal sinus tenderness.     Left Sinus: No maxillary sinus tenderness or frontal sinus tenderness.     Mouth/Throat:     Mouth: Mucous membranes are moist.     Pharynx: Oropharynx is clear. Uvula midline. No pharyngeal swelling, oropharyngeal exudate or posterior oropharyngeal erythema.  Eyes:     General: Lids are normal.        Right eye: No discharge.        Left eye: No discharge.     Extraocular Movements: Extraocular movements intact.     Conjunctiva/sclera: Conjunctivae normal.     Pupils: Pupils are equal, round, and reactive to light.     Visual Fields: Right eye visual fields normal and left eye visual fields normal.  Neck:     Thyroid: No thyromegaly.     Vascular: No carotid bruit.     Trachea: Trachea normal.  Cardiovascular:     Rate and Rhythm: Normal rate and regular rhythm.     Heart sounds: Normal heart sounds. No murmur heard.    No gallop.  Pulmonary:     Effort: Pulmonary effort is normal. No accessory muscle usage or respiratory distress.     Breath sounds: Normal breath sounds.  Chest:  Breasts:    Right: Normal.     Left: Normal.  Abdominal:     General: Bowel sounds  are normal.     Palpations: Abdomen is soft. There is no hepatomegaly or splenomegaly.     Tenderness: There is no abdominal tenderness.  Musculoskeletal:        General: Normal range of motion.     Cervical back: Normal range of motion and neck supple.     Right lower leg: No edema.     Left lower leg: No edema.  Lymphadenopathy:     Head:     Right side of head: No submental, submandibular, tonsillar, preauricular or posterior auricular adenopathy.     Left side of head: No submental, submandibular, tonsillar,  preauricular or posterior auricular adenopathy.     Cervical: No cervical adenopathy.     Upper Body:     Right upper body: No supraclavicular, axillary or pectoral adenopathy.     Left upper body: No supraclavicular, axillary or pectoral adenopathy.  Skin:    General: Skin is warm and dry.     Capillary Refill: Capillary refill takes less than 2 seconds.     Findings: No rash.  Neurological:     Mental Status: She is alert and oriented to person, place, and time.     Gait: Gait is intact.  Psychiatric:        Attention and Perception: Attention normal.        Mood and Affect: Mood normal.        Speech: Speech normal.        Behavior: Behavior normal. Behavior is cooperative.        Thought Content: Thought content normal.        Judgment: Judgment normal.     Results for orders placed or performed in visit on 12/12/22  Urine Culture   Collection Time: 12/12/22  8:53 AM   Specimen: Urine   UR  Result Value Ref Range   Urine Culture, Routine Final report    Organism ID, Bacteria Comment   Microscopic Examination   Collection Time: 12/12/22  8:53 AM   Urine  Result Value Ref Range   WBC, UA 0-5 0 - 5 /hpf   RBC, Urine 0-2 0 - 2 /hpf   Epithelial Cells (non renal) 0-10 0 - 10 /hpf   Bacteria, UA None seen None seen/Few  Urinalysis, Routine w reflex microscopic   Collection Time: 12/12/22  8:53 AM  Result Value Ref Range   Specific Gravity, UA 1.025 1.005 - 1.030   pH, UA 5.5 5.0 - 7.5   Color, UA Yellow Yellow   Appearance Ur Clear Clear   Leukocytes,UA Trace (A) Negative   Protein,UA Negative Negative/Trace   Glucose, UA Negative Negative   Ketones, UA Negative Negative   RBC, UA Trace (A) Negative   Bilirubin, UA Negative Negative   Urobilinogen, Ur 0.2 0.2 - 1.0 mg/dL   Nitrite, UA Negative Negative   Microscopic Examination See below:       Assessment & Plan:   Problem List Items Addressed This Visit       Digestive   NAFLD (nonalcoholic fatty liver  disease)   Has lost about 15lbs since starting Mounjaro.  Doing well with medication.         Other   Hyperlipidemia   Chronic.  Controlled.  Continue with current medication regimen of atorvastatin.  Refills sent today.  Labs ordered today.  Return to clinic in 6 months for reevaluation.  Call sooner if concerns arise.       Relevant Medications   atorvastatin (LIPITOR)  80 MG tablet   Other Visit Diagnoses       Annual physical exam    -  Primary   Helath maintenance reviewe dduring visit today.  Labs ordered.  Vaccines discussed.  Mammogram and colonoscopy up to date.   Relevant Orders   CBC with Differential/Platelet   Comprehensive metabolic panel   Lipid panel   TSH   Urinalysis, Routine w reflex microscopic         Follow up plan: Return in about 6 months (around 08/12/2023) for HTN, HLD, DM2 FU.   LABORATORY TESTING:  - Pap smear: not applicable  IMMUNIZATIONS:   - Tdap: Tetanus vaccination status reviewed: last tetanus booster within 10 years. - Influenza: Refused - Pneumovax: Up to date - Prevnar: Up to date - COVID: Not applicable - HPV: Administered today - Shingrix vaccine: received first shot  SCREENING: -Mammogram: Up to date  - Colonoscopy: Up to date  - Bone Density: Up to date  -Hearing Test: Not applicable  -Spirometry: Not applicable   PATIENT COUNSELING:   Advised to take 1 mg of folate supplement per day if capable of pregnancy.   Sexuality: Discussed sexually transmitted diseases, partner selection, use of condoms, avoidance of unintended pregnancy  and contraceptive alternatives.   Advised to avoid cigarette smoking.  I discussed with the patient that most people either abstain from alcohol or drink within safe limits (<=14/week and <=4 drinks/occasion for males, <=7/weeks and <= 3 drinks/occasion for females) and that the risk for alcohol disorders and other health effects rises proportionally with the number of drinks per week and how  often a drinker exceeds daily limits.  Discussed cessation/primary prevention of drug use and availability of treatment for abuse.   Diet: Encouraged to adjust caloric intake to maintain  or achieve ideal body weight, to reduce intake of dietary saturated fat and total fat, to limit sodium intake by avoiding high sodium foods and not adding table salt, and to maintain adequate dietary potassium and calcium preferably from fresh fruits, vegetables, and low-fat dairy products.    stressed the importance of regular exercise  Injury prevention: Discussed safety belts, safety helmets, smoke detector, smoking near bedding or upholstery.   Dental health: Discussed importance of regular tooth brushing, flossing, and dental visits.    NEXT PREVENTATIVE PHYSICAL DUE IN 1 YEAR. Return in about 6 months (around 08/12/2023) for HTN, HLD, DM2 FU.

## 2023-02-11 ENCOUNTER — Ambulatory Visit (INDEPENDENT_AMBULATORY_CARE_PROVIDER_SITE_OTHER): Payer: No Typology Code available for payment source | Admitting: Nurse Practitioner

## 2023-02-11 ENCOUNTER — Encounter: Payer: Self-pay | Admitting: Nurse Practitioner

## 2023-02-11 VITALS — BP 112/79 | HR 75 | Temp 97.7°F | Ht 62.0 in | Wt 159.2 lb

## 2023-02-11 DIAGNOSIS — K760 Fatty (change of) liver, not elsewhere classified: Secondary | ICD-10-CM | POA: Diagnosis not present

## 2023-02-11 DIAGNOSIS — E782 Mixed hyperlipidemia: Secondary | ICD-10-CM

## 2023-02-11 DIAGNOSIS — E785 Hyperlipidemia, unspecified: Secondary | ICD-10-CM | POA: Diagnosis not present

## 2023-02-11 DIAGNOSIS — Z0000 Encounter for general adult medical examination without abnormal findings: Secondary | ICD-10-CM | POA: Diagnosis not present

## 2023-02-11 LAB — MICROSCOPIC EXAMINATION: Bacteria, UA: NONE SEEN

## 2023-02-11 LAB — URINALYSIS, ROUTINE W REFLEX MICROSCOPIC
Bilirubin, UA: NEGATIVE
Glucose, UA: NEGATIVE
Ketones, UA: NEGATIVE
Nitrite, UA: NEGATIVE
Protein,UA: NEGATIVE
RBC, UA: NEGATIVE
Specific Gravity, UA: >1.03 — ABNORMAL HIGH (ref 1.005–1.030)
Urobilinogen, Ur: 0.2 mg/dL (ref 0.2–1.0)
pH, UA: 5.5 (ref 5.0–7.5)

## 2023-02-11 MED ORDER — TIRZEPATIDE 10 MG/0.5ML ~~LOC~~ SOAJ: 10.0000 mg | SUBCUTANEOUS | 2 refills | Status: DC

## 2023-02-11 MED ORDER — ATORVASTATIN CALCIUM 80 MG PO TABS: 80.0000 mg | ORAL_TABLET | Freq: Every day | ORAL | 1 refills | Status: DC

## 2023-02-11 MED ORDER — OMEPRAZOLE 40 MG PO CPDR: 40.0000 mg | DELAYED_RELEASE_CAPSULE | Freq: Every day | ORAL | 1 refills | Status: DC

## 2023-02-11 NOTE — Assessment & Plan Note (Signed)
Chronic.  Controlled.  Continue with current medication regimen of atorvastatin.  Refills sent today.  Labs ordered today.  Return to clinic in 6 months for reevaluation.  Call sooner if concerns arise.

## 2023-02-11 NOTE — Assessment & Plan Note (Signed)
Has lost about 15lbs since starting Mounjaro.  Doing well with medication.

## 2023-02-12 ENCOUNTER — Encounter: Payer: Self-pay | Admitting: Nurse Practitioner

## 2023-02-12 ENCOUNTER — Telehealth: Payer: Self-pay | Admitting: Nurse Practitioner

## 2023-02-12 LAB — CBC WITH DIFFERENTIAL/PLATELET
Basophils Absolute: 0 10*3/uL (ref 0.0–0.2)
Basos: 1 %
EOS (ABSOLUTE): 0.1 10*3/uL (ref 0.0–0.4)
Eos: 2 %
Hematocrit: 43.6 % (ref 34.0–46.6)
Hemoglobin: 13.9 g/dL (ref 11.1–15.9)
Immature Grans (Abs): 0 10*3/uL (ref 0.0–0.1)
Immature Granulocytes: 0 %
Lymphocytes Absolute: 1.8 10*3/uL (ref 0.7–3.1)
Lymphs: 32 %
MCH: 30.2 pg (ref 26.6–33.0)
MCHC: 31.9 g/dL (ref 31.5–35.7)
MCV: 95 fL (ref 79–97)
Monocytes Absolute: 0.4 10*3/uL (ref 0.1–0.9)
Monocytes: 7 %
Neutrophils Absolute: 3.3 10*3/uL (ref 1.4–7.0)
Neutrophils: 58 %
Platelets: 263 10*3/uL (ref 150–450)
RBC: 4.6 x10E6/uL (ref 3.77–5.28)
RDW: 13.1 % (ref 11.7–15.4)
WBC: 5.7 10*3/uL (ref 3.4–10.8)

## 2023-02-12 LAB — COMPREHENSIVE METABOLIC PANEL
ALT: 46 [IU]/L — ABNORMAL HIGH (ref 0–32)
AST: 31 [IU]/L (ref 0–40)
Albumin: 4.3 g/dL (ref 3.8–4.8)
Alkaline Phosphatase: 135 [IU]/L — ABNORMAL HIGH (ref 44–121)
BUN/Creatinine Ratio: 31 — ABNORMAL HIGH (ref 12–28)
BUN: 21 mg/dL (ref 8–27)
Bilirubin Total: 0.4 mg/dL (ref 0.0–1.2)
CO2: 26 mmol/L (ref 20–29)
Calcium: 9.4 mg/dL (ref 8.7–10.3)
Chloride: 102 mmol/L (ref 96–106)
Creatinine, Ser: 0.67 mg/dL (ref 0.57–1.00)
Globulin, Total: 2.4 g/dL (ref 1.5–4.5)
Glucose: 81 mg/dL (ref 70–99)
Potassium: 4.4 mmol/L (ref 3.5–5.2)
Sodium: 142 mmol/L (ref 134–144)
Total Protein: 6.7 g/dL (ref 6.0–8.5)
eGFR: 93 mL/min/{1.73_m2} (ref >59)

## 2023-02-12 LAB — LIPID PANEL
Chol/HDL Ratio: 2.1 {ratio} (ref 0.0–4.4)
Cholesterol, Total: 130 mg/dL (ref 100–199)
HDL: 61 mg/dL (ref >39)
LDL Chol Calc (NIH): 54 mg/dL (ref 0–99)
Triglycerides: 72 mg/dL (ref 0–149)
VLDL Cholesterol Cal: 15 mg/dL (ref 5–40)

## 2023-02-12 LAB — TSH: TSH: 2.05 u[IU]/mL (ref 0.450–4.500)

## 2023-02-12 NOTE — Telephone Encounter (Signed)
Pt attempted to pick up mounjaro prescription from [facility] pharmacy. Pt informed cost would be $3000, pt stating that is too expensive.   PT reqeusting it be sent to [facility] pharmacy - [address] [place], [place], [place] [address]  [phone] as previously discussed with provider at yesterday's visit. Pt discussed with provider if too expensive at [facility].   Pt requesting callback once change is made.

## 2023-02-12 NOTE — Telephone Encounter (Signed)
Patient would like the compounded version sent to [facility].  She is taking the 10mg  dose of tirzepatide.  Please start this form to get it sent over to [facility].

## 2023-02-13 NOTE — Telephone Encounter (Signed)
Form was faxed to [facility]

## 2023-04-28 ENCOUNTER — Encounter: Payer: Self-pay | Admitting: Nurse Practitioner

## 2023-05-05 DIAGNOSIS — Z008 Encounter for other general examination: Secondary | ICD-10-CM | POA: Diagnosis not present

## 2023-05-05 DIAGNOSIS — Z6828 Body mass index (BMI) 28.0-28.9, adult: Secondary | ICD-10-CM | POA: Diagnosis not present

## 2023-05-05 DIAGNOSIS — B0221 Postherpetic geniculate ganglionitis: Secondary | ICD-10-CM | POA: Diagnosis not present

## 2023-05-05 DIAGNOSIS — E663 Overweight: Secondary | ICD-10-CM | POA: Diagnosis not present

## 2023-05-05 DIAGNOSIS — E785 Hyperlipidemia, unspecified: Secondary | ICD-10-CM | POA: Diagnosis not present

## 2023-05-05 DIAGNOSIS — R2681 Unsteadiness on feet: Secondary | ICD-10-CM | POA: Diagnosis not present

## 2023-06-10 ENCOUNTER — Encounter: Payer: Self-pay | Admitting: Nurse Practitioner

## 2023-06-10 ENCOUNTER — Ambulatory Visit (INDEPENDENT_AMBULATORY_CARE_PROVIDER_SITE_OTHER): Admitting: Nurse Practitioner

## 2023-06-10 VITALS — BP 120/78 | HR 75 | Temp 97.6°F | Wt 163.0 lb

## 2023-06-10 DIAGNOSIS — G8929 Other chronic pain: Secondary | ICD-10-CM

## 2023-06-10 DIAGNOSIS — M546 Pain in thoracic spine: Secondary | ICD-10-CM | POA: Diagnosis not present

## 2023-06-10 NOTE — Progress Notes (Signed)
 BP 120/78   Pulse 75   Temp 97.6 F (36.4 C) (Oral)   Wt 163 lb (73.9 kg)   SpO2 98%   BMI 29.81 kg/m    Subjective:    Patient ID: Abigail Delacruz, female    DOB: 1950/07/02, 73 y.o.   MRN: [id number]  HPI: Abigail Delacruz is a 73 y.o. female  Chief Complaint  Patient presents with   Hand Pain   Back Pain   BACK PAIN Patient states she is now having pain and burning in her fingers.  They also swell at times.  She has seen [facility] who gave her Mobic and Robaxin.  She believes she took the medication. She did not follow up with in 4 weeks as requested by Ortho. Duration: months Mechanism of injury: unknown Location: bilateral Onset: gradual Severity: severe Quality: aching- muscles and bones hurt Frequency: when she moves around Radiation: none Aggravating factors: movement Alleviating factors: nothing and rest Status: worse Treatments attempted: none  Relief with NSAIDs?: No NSAIDs Taken Nighttime pain:  no Paresthesias / decreased sensation:  yes Bowel / bladder incontinence:  no Fevers:  no Dysuria / urinary frequency:  no  Relevant past medical, surgical, family and social history reviewed and updated as indicated. Interim medical history since our last visit reviewed. Allergies and medications reviewed and updated.  Review of Systems  Musculoskeletal:  Positive for back pain.  Neurological:  Positive for numbness.       Burning pain    Per HPI unless specifically indicated above     Objective:    BP 120/78   Pulse 75   Temp 97.6 F (36.4 C) (Oral)   Wt 163 lb (73.9 kg)   SpO2 98%   BMI 29.81 kg/m   Wt Readings from Last 3 Encounters:  06/10/23 163 lb (73.9 kg)  02/11/23 159 lb 3.2 oz (72.2 kg)  12/05/22 158 lb 3.2 oz (71.8 kg)    Physical Exam Vitals and nursing note reviewed.  Constitutional:      General: She is not in acute distress.    Appearance: Normal appearance. She is normal weight. She is not ill-appearing, toxic-appearing  or diaphoretic.  HENT:     Head: Normocephalic.     Right Ear: External ear normal.     Left Ear: External ear normal.     Nose: Nose normal.     Mouth/Throat:     Mouth: Mucous membranes are moist.     Pharynx: Oropharynx is clear.  Eyes:     General:        Right eye: No discharge.        Left eye: No discharge.     Extraocular Movements: Extraocular movements intact.     Conjunctiva/sclera: Conjunctivae normal.     Pupils: Pupils are equal, round, and reactive to light.  Cardiovascular:     Rate and Rhythm: Normal rate and regular rhythm.     Heart sounds: No murmur heard. Pulmonary:     Effort: Pulmonary effort is normal. No respiratory distress.     Breath sounds: Normal breath sounds. No wheezing or rales.  Musculoskeletal:     Cervical back: Normal range of motion and neck supple.     Thoracic back: Spasms present.  Skin:    General: Skin is warm and dry.     Capillary Refill: Capillary refill takes less than 2 seconds.  Neurological:     General: No focal deficit present.  Mental Status: She is alert and oriented to person, place, and time. Mental status is at baseline.  Psychiatric:        Mood and Affect: Mood normal.        Behavior: Behavior normal.        Thought Content: Thought content normal.        Judgment: Judgment normal.     Results for orders placed or performed in visit on 02/11/23  Microscopic Examination   Collection Time: 02/11/23  9:13 AM   Urine  Result Value Ref Range   WBC, UA 0-5 0 - 5 /hpf   RBC, Urine 0-2 0 - 2 /hpf   Epithelial Cells (non renal) 0-10 0 - 10 /hpf   Mucus, UA Present (A) Not Estab.   Bacteria, UA None seen None seen/Few  Urinalysis, Routine w reflex microscopic   Collection Time: 02/11/23  9:13 AM  Result Value Ref Range   Specific Gravity, UA >1.030 (H) 1.005 - 1.030   pH, UA 5.5 5.0 - 7.5   Color, UA Yellow Yellow   Appearance Ur Clear Clear   Leukocytes,UA 1+ (A) Negative   Protein,UA Negative  Negative/Trace   Glucose, UA Negative Negative   Ketones, UA Negative Negative   RBC, UA Negative Negative   Bilirubin, UA Negative Negative   Urobilinogen, Ur 0.2 0.2 - 1.0 mg/dL   Nitrite, UA Negative Negative   Microscopic Examination See below:   CBC with Differential/Platelet   Collection Time: 02/11/23  9:14 AM  Result Value Ref Range   WBC 5.7 3.4 - 10.8 x10E3/uL   RBC 4.60 3.77 - 5.28 x10E6/uL   Hemoglobin 13.9 11.1 - 15.9 g/dL   Hematocrit 78.4 69.6 - 46.6 %   MCV 95 79 - 97 fL   MCH 30.2 26.6 - 33.0 pg   MCHC 31.9 31.5 - 35.7 g/dL   RDW 29.5 28.4 - 13.2 %   Platelets 263 150 - 450 x10E3/uL   Neutrophils 58 Not Estab. %   Lymphs 32 Not Estab. %   Monocytes 7 Not Estab. %   Eos 2 Not Estab. %   Basos 1 Not Estab. %   Neutrophils Absolute 3.3 1.4 - 7.0 x10E3/uL   Lymphocytes Absolute 1.8 0.7 - 3.1 x10E3/uL   Monocytes Absolute 0.4 0.1 - 0.9 x10E3/uL   EOS (ABSOLUTE) 0.1 0.0 - 0.4 x10E3/uL   Basophils Absolute 0.0 0.0 - 0.2 x10E3/uL   Immature Granulocytes 0 Not Estab. %   Immature Grans (Abs) 0.0 0.0 - 0.1 x10E3/uL  Comprehensive metabolic panel   Collection Time: 02/11/23  9:14 AM  Result Value Ref Range   Glucose 81 70 - 99 mg/dL   BUN 21 8 - 27 mg/dL   Creatinine, Ser 4.40 0.57 - 1.00 mg/dL   eGFR 93 >10 UV/OZD/6.64   BUN/Creatinine Ratio 31 (H) 12 - 28   Sodium 142 134 - 144 mmol/L   Potassium 4.4 3.5 - 5.2 mmol/L   Chloride 102 96 - 106 mmol/L   CO2 26 20 - 29 mmol/L   Calcium 9.4 8.7 - 10.3 mg/dL   Total Protein 6.7 6.0 - 8.5 g/dL   Albumin 4.3 3.8 - 4.8 g/dL   Globulin, Total 2.4 1.5 - 4.5 g/dL   Bilirubin Total 0.4 0.0 - 1.2 mg/dL   Alkaline Phosphatase 135 (H) 44 - 121 IU/L   AST 31 0 - 40 IU/L   ALT 46 (H) 0 - 32 IU/L  Lipid panel   Collection  Time: 02/11/23  9:14 AM  Result Value Ref Range   Cholesterol, Total 130 100 - 199 mg/dL   Triglycerides 72 0 - 149 mg/dL   HDL 61 >16 mg/dL   VLDL Cholesterol Cal 15 5 - 40 mg/dL   LDL Chol Calc (NIH)  54 0 - 99 mg/dL   Chol/HDL Ratio 2.1 0.0 - 4.4 ratio  TSH   Collection Time: 02/11/23  9:14 AM  Result Value Ref Range   TSH 2.050 0.450 - 4.500 uIU/mL      Assessment & Plan:   Problem List Items Addressed This Visit   None Visit Diagnoses       Chronic bilateral thoracic back pain    -  Primary   Ongoing back pain that has worsened. Now associated with numbness and pain in her fingers. Has been seeing Dr. Annabell Key at [facility]. Encouraged patient to follow up with Ortho for further evaluation and management.  Patient declined starting Gabapentin due to [facility] Syndrome.        Follow up plan: No follow-ups on file.

## 2023-06-11 DIAGNOSIS — G5603 Carpal tunnel syndrome, bilateral upper limbs: Secondary | ICD-10-CM | POA: Diagnosis not present

## 2023-07-17 DIAGNOSIS — R202 Paresthesia of skin: Secondary | ICD-10-CM | POA: Diagnosis not present

## 2023-07-28 DIAGNOSIS — M19042 Primary osteoarthritis, left hand: Secondary | ICD-10-CM | POA: Diagnosis not present

## 2023-07-28 DIAGNOSIS — R768 Other specified abnormal immunological findings in serum: Secondary | ICD-10-CM | POA: Diagnosis not present

## 2023-07-28 DIAGNOSIS — R202 Paresthesia of skin: Secondary | ICD-10-CM | POA: Diagnosis not present

## 2023-07-28 DIAGNOSIS — M2550 Pain in unspecified joint: Secondary | ICD-10-CM | POA: Diagnosis not present

## 2023-08-12 ENCOUNTER — Ambulatory Visit (INDEPENDENT_AMBULATORY_CARE_PROVIDER_SITE_OTHER): Payer: Self-pay | Admitting: Nurse Practitioner

## 2023-08-12 ENCOUNTER — Encounter: Payer: Self-pay | Admitting: Nurse Practitioner

## 2023-08-12 VITALS — BP 124/76 | HR 73 | Temp 98.2°F | Resp 15 | Ht 62.01 in | Wt 164.8 lb

## 2023-08-12 DIAGNOSIS — E785 Hyperlipidemia, unspecified: Secondary | ICD-10-CM

## 2023-08-12 DIAGNOSIS — K760 Fatty (change of) liver, not elsewhere classified: Secondary | ICD-10-CM

## 2023-08-12 DIAGNOSIS — M1990 Unspecified osteoarthritis, unspecified site: Secondary | ICD-10-CM | POA: Insufficient documentation

## 2023-08-12 DIAGNOSIS — R748 Abnormal levels of other serum enzymes: Secondary | ICD-10-CM | POA: Diagnosis not present

## 2023-08-12 DIAGNOSIS — M1991 Primary osteoarthritis, unspecified site: Secondary | ICD-10-CM | POA: Diagnosis not present

## 2023-08-12 NOTE — Assessment & Plan Note (Signed)
 Chronic.  Controlled.  Continue with current medication regimen.  Labs ordered today.  Return to clinic in 6 months for reevaluation.  Call sooner if concerns arise.  ? ?

## 2023-08-12 NOTE — Assessment & Plan Note (Signed)
 Chronic.  Has been on Mounjaro  to help with NAFLD.  Liver enzymes have improved. Will recheck labs at visit today.

## 2023-08-12 NOTE — Progress Notes (Signed)
 BP 124/76 (BP Location: Right Arm, Patient Position: Sitting, Cuff Size: Large)   Pulse 73   Temp 98.2 F (36.8 C) (Oral)   Resp 15   Ht 5' 2.01 (1.575 m)   Wt 164 lb 12.8 oz (74.8 kg)   SpO2 99%   BMI 30.13 kg/m    Subjective:    Patient ID: Abigail Delacruz, female    DOB: 01-13-1951, 73 y.o.   MRN: [id number]  HPI: Abigail Delacruz is a 73 y.o. female  Chief Complaint  Patient presents with   Follow-up    Would like to better understand her diagnosis related to her hands/wrists. Was seen by [facility] but has not had great follow up.    HYPERLIPIDEMIA Hyperlipidemia status: excellent compliance Satisfied with current treatment?  yes Side effects:  no Medication compliance: excellent compliance Past cholesterol meds: atorvastain (lipitor) Supplements: none Aspirin:  no The 10-year ASCVD risk score (Arnett DK, et al., 2019) is: 8.9%   Values used to calculate the score:     Age: 22 years     Sex: Female     Is Non-Hispanic African American: No     Diabetic: No     Tobacco smoker: No     Systolic Blood Pressure: 112 mmHg     Is BP treated: No     HDL Cholesterol: 53 mg/dL     Total Cholesterol: 174 mg/dL Chest pain:  no Coronary artery disease:  no Family history CAD:  no Family history early CAD:  no   Denies HA, CP, SOB, dizziness, palpitations, visual changes, and lower extremity swelling.   Patient states she is having hand pain. She was seen by Ortho and Rheumatology.  Positive ANA thought to be false positive.  Thought to be osteoarthritis.  [facility] recommended to follow up with Rheumatology.  She was prescribed Pregabalin  but didn't take it due to having significant fatigue with Gabapentin .  She is currently using a heating pad and a pain relief spray.  She is having a lot of swelling, stiffness, and burning.  She is taking Meloxicam  which does help with the symptoms.    Relevant past medical, surgical, family and social history reviewed and updated as  indicated. Interim medical history since our last visit reviewed. Allergies and medications reviewed and updated.  Review of Systems  Eyes:  Negative for visual disturbance.  Respiratory:  Negative for cough, chest tightness and shortness of breath.   Cardiovascular:  Negative for chest pain, palpitations and leg swelling.  Musculoskeletal:        Hand pain  Neurological:  Negative for dizziness and headaches.    Per HPI unless specifically indicated above     Objective:    BP 124/76 (BP Location: Right Arm, Patient Position: Sitting, Cuff Size: Large)   Pulse 73   Temp 98.2 F (36.8 C) (Oral)   Resp 15   Ht 5' 2.01 (1.575 m)   Wt 164 lb 12.8 oz (74.8 kg)   SpO2 99%   BMI 30.13 kg/m   Wt Readings from Last 3 Encounters:  08/12/23 164 lb 12.8 oz (74.8 kg)  06/10/23 163 lb (73.9 kg)  02/11/23 159 lb 3.2 oz (72.2 kg)    Physical Exam Vitals and nursing note reviewed.  Constitutional:      General: She is not in acute distress.    Appearance: Normal appearance. She is normal weight. She is not ill-appearing, toxic-appearing or diaphoretic.  HENT:     Head: Normocephalic.  Right Ear: External ear normal.     Left Ear: External ear normal.     Nose: Nose normal.     Mouth/Throat:     Mouth: Mucous membranes are moist.     Pharynx: Oropharynx is clear.   Eyes:     General:        Right eye: No discharge.        Left eye: No discharge.     Extraocular Movements: Extraocular movements intact.     Conjunctiva/sclera: Conjunctivae normal.     Pupils: Pupils are equal, round, and reactive to light.    Cardiovascular:     Rate and Rhythm: Normal rate and regular rhythm.     Heart sounds: No murmur heard. Pulmonary:     Effort: Pulmonary effort is normal. No respiratory distress.     Breath sounds: Normal breath sounds. No wheezing or rales.   Musculoskeletal:     Cervical back: Normal range of motion and neck supple.   Skin:    General: Skin is warm and dry.      Capillary Refill: Capillary refill takes less than 2 seconds.   Neurological:     General: No focal deficit present.     Mental Status: She is alert and oriented to person, place, and time. Mental status is at baseline.   Psychiatric:        Mood and Affect: Mood normal.        Behavior: Behavior normal.        Thought Content: Thought content normal.        Judgment: Judgment normal.     Results for orders placed or performed in visit on 02/11/23  Microscopic Examination   Collection Time: 02/11/23  9:13 AM   Urine  Result Value Ref Range   WBC, UA 0-5 0 - 5 /hpf   RBC, Urine 0-2 0 - 2 /hpf   Epithelial Cells (non renal) 0-10 0 - 10 /hpf   Mucus, UA Present (A) Not Estab.   Bacteria, UA None seen None seen/Few  Urinalysis, Routine w reflex microscopic   Collection Time: 02/11/23  9:13 AM  Result Value Ref Range   Specific Gravity, UA >1.030 (H) 1.005 - 1.030   pH, UA 5.5 5.0 - 7.5   Color, UA Yellow Yellow   Appearance Ur Clear Clear   Leukocytes,UA 1+ (A) Negative   Protein,UA Negative Negative/Trace   Glucose, UA Negative Negative   Ketones, UA Negative Negative   RBC, UA Negative Negative   Bilirubin, UA Negative Negative   Urobilinogen, Ur 0.2 0.2 - 1.0 mg/dL   Nitrite, UA Negative Negative   Microscopic Examination See below:   CBC with Differential/Platelet   Collection Time: 02/11/23  9:14 AM  Result Value Ref Range   WBC 5.7 3.4 - 10.8 x10E3/uL   RBC 4.60 3.77 - 5.28 x10E6/uL   Hemoglobin 13.9 11.1 - 15.9 g/dL   Hematocrit 04.5 40.9 - 46.6 %   MCV 95 79 - 97 fL   MCH 30.2 26.6 - 33.0 pg   MCHC 31.9 31.5 - 35.7 g/dL   RDW 81.1 91.4 - 78.2 %   Platelets 263 150 - 450 x10E3/uL   Neutrophils 58 Not Estab. %   Lymphs 32 Not Estab. %   Monocytes 7 Not Estab. %   Eos 2 Not Estab. %   Basos 1 Not Estab. %   Neutrophils Absolute 3.3 1.4 - 7.0 x10E3/uL   Lymphocytes Absolute 1.8 0.7 - 3.1  x10E3/uL   Monocytes Absolute 0.4 0.1 - 0.9 x10E3/uL   EOS  (ABSOLUTE) 0.1 0.0 - 0.4 x10E3/uL   Basophils Absolute 0.0 0.0 - 0.2 x10E3/uL   Immature Granulocytes 0 Not Estab. %   Immature Grans (Abs) 0.0 0.0 - 0.1 x10E3/uL  Comprehensive metabolic panel   Collection Time: 02/11/23  9:14 AM  Result Value Ref Range   Glucose 81 70 - 99 mg/dL   BUN 21 8 - 27 mg/dL   Creatinine, Ser 1.61 0.57 - 1.00 mg/dL   eGFR 93 >09 UE/AVW/0.98   BUN/Creatinine Ratio 31 (H) 12 - 28   Sodium 142 134 - 144 mmol/L   Potassium 4.4 3.5 - 5.2 mmol/L   Chloride 102 96 - 106 mmol/L   CO2 26 20 - 29 mmol/L   Calcium  9.4 8.7 - 10.3 mg/dL   Total Protein 6.7 6.0 - 8.5 g/dL   Albumin 4.3 3.8 - 4.8 g/dL   Globulin, Total 2.4 1.5 - 4.5 g/dL   Bilirubin Total 0.4 0.0 - 1.2 mg/dL   Alkaline Phosphatase 135 (H) 44 - 121 IU/L   AST 31 0 - 40 IU/L   ALT 46 (H) 0 - 32 IU/L  Lipid panel   Collection Time: 02/11/23  9:14 AM  Result Value Ref Range   Cholesterol, Total 130 100 - 199 mg/dL   Triglycerides 72 0 - 149 mg/dL   HDL 61 >11 mg/dL   VLDL Cholesterol Cal 15 5 - 40 mg/dL   LDL Chol Calc (NIH) 54 0 - 99 mg/dL   Chol/HDL Ratio 2.1 0.0 - 4.4 ratio  TSH   Collection Time: 02/11/23  9:14 AM  Result Value Ref Range   TSH 2.050 0.450 - 4.500 uIU/mL      Assessment & Plan:   Problem List Items Addressed This Visit       Digestive   NAFLD (nonalcoholic fatty liver disease)   Chronic.  Has been on Mounjaro  to help with NAFLD.  Liver enzymes have improved. Will recheck labs at visit today.        Relevant Orders   Comprehensive metabolic panel with GFR   Lipid panel     Musculoskeletal and Integument   Osteoarthritis   False positive ANA. Ortho and Rheumatology have determined the cause is Osteoarthritis.  Discussed anti inflammatory foods.  Continue with Meloxicam .  Follow up if not improved.       Relevant Medications   meloxicam  (MOBIC ) 15 MG tablet     Other   Hyperlipidemia - Primary   Chronic.  Controlled.  Continue with current medication regimen.   Labs ordered today.  Return to clinic in 6 months for reevaluation.  Call sooner if concerns arise.        Relevant Orders   Comprehensive metabolic panel with GFR   Lipid panel   Elevated alkaline phosphatase level   Further labs ordered to evaluate for vit d deficiency vs other causes.      Relevant Orders   Gamma GT   Vitamin D (25 hydroxy)   PTH, Intact and Calcium      Follow up plan: No follow-ups on file.

## 2023-08-12 NOTE — Assessment & Plan Note (Signed)
 Further labs ordered to evaluate for vit d deficiency vs other causes.

## 2023-08-12 NOTE — Assessment & Plan Note (Signed)
 False positive ANA. Ortho and Rheumatology have determined the cause is Osteoarthritis.  Discussed anti inflammatory foods.  Continue with Meloxicam .  Follow up if not improved.

## 2023-08-13 ENCOUNTER — Ambulatory Visit: Payer: Self-pay

## 2023-08-13 NOTE — Telephone Encounter (Signed)
 Responded to patient via mychart

## 2023-08-13 NOTE — Telephone Encounter (Addendum)
 Patient called in wanting questions about why her liver enzymes were elevated. She also wants to know why her BUN was elevated. This RN informed patient the BUN slight elevation could be due to dehydration. Patient states she drinks mostly water  and one small bottle of green tea daily, with one cup of coffee. Patient states pcp can send mychart message. Please advise.        Copied from CRM [id number]. Topic: Clinical - Lab/Test Results >> Aug 13, 2023  2:42 PM Chrystal Crape R wrote: Pt would like a call back to explain her lab results.

## 2023-08-14 ENCOUNTER — Ambulatory Visit: Payer: Self-pay | Admitting: Nurse Practitioner

## 2023-08-14 ENCOUNTER — Other Ambulatory Visit: Payer: Self-pay

## 2023-08-14 LAB — GAMMA GT: GGT: 20 IU/L (ref 0–60)

## 2023-08-14 LAB — COMPREHENSIVE METABOLIC PANEL WITH GFR
ALT: 66 IU/L — ABNORMAL HIGH (ref 0–32)
AST: 34 IU/L (ref 0–40)
Albumin: 4.3 g/dL (ref 3.8–4.8)
Alkaline Phosphatase: 125 IU/L — ABNORMAL HIGH (ref 44–121)
BUN/Creatinine Ratio: 29 — ABNORMAL HIGH (ref 12–28)
BUN: 18 mg/dL (ref 8–27)
Bilirubin Total: 0.6 mg/dL (ref 0.0–1.2)
CO2: 23 mmol/L (ref 20–29)
Calcium: 9.6 mg/dL (ref 8.7–10.3)
Chloride: 102 mmol/L (ref 96–106)
Creatinine, Ser: 0.63 mg/dL (ref 0.57–1.00)
Globulin, Total: 2.2 g/dL (ref 1.5–4.5)
Glucose: 84 mg/dL (ref 70–99)
Potassium: 4.3 mmol/L (ref 3.5–5.2)
Sodium: 140 mmol/L (ref 134–144)
Total Protein: 6.5 g/dL (ref 6.0–8.5)
eGFR: 94 mL/min/{1.73_m2} (ref >59)

## 2023-08-14 LAB — LIPID PANEL
Chol/HDL Ratio: 2.7 ratio (ref 0.0–4.4)
Cholesterol, Total: 125 mg/dL (ref 100–199)
HDL: 47 mg/dL (ref >39)
LDL Chol Calc (NIH): 55 mg/dL (ref 0–99)
Triglycerides: 132 mg/dL (ref 0–149)
VLDL Cholesterol Cal: 23 mg/dL (ref 5–40)

## 2023-08-14 LAB — PTH, INTACT AND CALCIUM: PTH: 16 pg/mL (ref 15–65)

## 2023-08-14 LAB — VITAMIN D 25 HYDROXY (VIT D DEFICIENCY, FRACTURES): Vit D, 25-Hydroxy: 28.7 ng/mL — ABNORMAL LOW (ref 30.0–100.0)

## 2023-08-14 MED ORDER — VITAMIN D3 25 MCG (1000 UT) PO CAPS: 3000.0000 [IU] | ORAL_CAPSULE | Freq: Every day | ORAL | 1 refills | Status: AC

## 2023-08-14 NOTE — Telephone Encounter (Signed)
 Prescription sent to the pharmacy.

## 2023-08-26 MED ORDER — ZEPBOUND 2.5 MG/0.5ML ~~LOC~~ SOAJ: 2.5000 mg | SUBCUTANEOUS | 0 refills | Status: DC

## 2023-08-27 ENCOUNTER — Other Ambulatory Visit: Payer: Self-pay

## 2023-08-27 MED ORDER — ZEPBOUND 2.5 MG/0.5ML ~~LOC~~ SOAJ: 2.5000 mg | SUBCUTANEOUS | 0 refills | Status: DC

## 2023-08-27 NOTE — Telephone Encounter (Signed)
 Copied from CRM [id number]. Topic: Clinical - Medication Prior Auth >> Aug 27, 2023 10:59 AM Sophia H wrote: Reason for CRM: Patient states received an email from her insurance and when she called to speak to them she was advised that her tirzepatide  (ZEPBOUND ) 2.5 MG/0.5ML Pen was approved. Insurance told patient to let office know to send the prescription in, there shouldn't be any issues with authorization now.    [facility] - [place], [place] - [address]

## 2023-09-03 DIAGNOSIS — Z961 Presence of intraocular lens: Secondary | ICD-10-CM | POA: Diagnosis not present

## 2023-09-03 DIAGNOSIS — Z9889 Other specified postprocedural states: Secondary | ICD-10-CM | POA: Diagnosis not present

## 2023-09-22 ENCOUNTER — Other Ambulatory Visit: Payer: Self-pay | Admitting: Nurse Practitioner

## 2023-09-23 NOTE — Telephone Encounter (Signed)
 Requested medications are due for refill today.  yes  Requested medications are on the active medications list.  yes  Last refill. 08/27/2023 2mL 0 rf  Future visit scheduled.   yes  Notes to clinic.  Medication not assigned to a protocol. Please review for refill.    Requested Prescriptions  Pending Prescriptions Disp Refills   ZEPBOUND  2.5 MG/0.5ML Pen [Pharmacy Med Name: ZEPBOUND  2.5 MG/0.5ML SUBQ SOLN ML] 2 mL 0    Sig: INJECT 2.5MG  INTO THE SKIN ONCE A WEEK AS DIRECTED     Off-Protocol Failed - 09/23/2023  5:24 PM      Failed - Medication not assigned to a protocol, review manually.      Passed - Valid encounter within last 12 months    Recent Outpatient Visits           1 month ago Hyperlipidemia, unspecified hyperlipidemia type   [facility] Melvin Pao, NP   3 months ago Chronic bilateral thoracic back pain   [facility] Melvin Pao, NP

## 2023-09-26 ENCOUNTER — Telehealth: Payer: Self-pay

## 2023-09-26 NOTE — Telephone Encounter (Signed)
 Copied from CRM [id number]. Topic: Clinical - Prescription Issue >> Sep 26, 2023  9:02 AM Larissa RAMAN wrote: Reason for CRM: Patient states when she went to her pharmacy to pickup her Zepbound  medication, she received the 2.5 mg dosage and not the 5 mg dosage that she requested. Reviewed prescription and it states that 5 mg dosage was sent to her pharmacy. Advised patient to contact her pharmacy and to follow up if needed.

## 2023-09-30 ENCOUNTER — Ambulatory Visit (INDEPENDENT_AMBULATORY_CARE_PROVIDER_SITE_OTHER): Payer: Self-pay | Admitting: Emergency Medicine

## 2023-09-30 VITALS — Ht 62.0 in | Wt 163.0 lb

## 2023-09-30 DIAGNOSIS — Z0000 Encounter for general adult medical examination without abnormal findings: Secondary | ICD-10-CM | POA: Diagnosis not present

## 2023-09-30 DIAGNOSIS — Z1231 Encounter for screening mammogram for malignant neoplasm of breast: Secondary | ICD-10-CM

## 2023-09-30 NOTE — Progress Notes (Signed)
 Subjective:   Abigail Delacruz is a 73 y.o. who presents for a Medicare Wellness preventive visit.  As a reminder, Annual Wellness Visits don't include a physical exam, and some assessments may be limited, especially if this visit is performed virtually. We may recommend an in-person follow-up visit with your provider if needed.  Visit Complete: Virtual I connected with  Abigail Delacruz on 09/30/23 by a audio enabled telemedicine application and verified that I am speaking with the correct person using two identifiers.  Patient Location: Home  Provider Location: Office/Clinic  I discussed the limitations of evaluation and management by telemedicine. The patient expressed understanding and agreed to proceed.  Vital Signs: Because this visit was a virtual/telehealth visit, some criteria may be missing or patient reported. Any vitals not documented were not able to be obtained and vitals that have been documented are patient reported.  VideoDeclined- This patient declined Librarian, academic. Therefore the visit was completed with audio only.  Persons Participating in Visit: Patient.  AWV Questionnaire: No: Patient Medicare AWV questionnaire was not completed prior to this visit.  Cardiac Risk Factors include: advanced age (>38men, >69 women);dyslipidemia     Objective:    Today's Vitals   09/30/23 0803 09/30/23 0804  Weight: 163 lb (73.9 kg)   Height: 5' 2 (1.575 m)   PainSc:  6    Body mass index is 29.81 kg/m.     09/30/2023    8:14 AM 09/02/2022   11:05 AM 09/25/2021    8:16 AM 08/30/2021   12:46 PM 08/07/2020    3:20 PM 06/30/2020   10:43 AM 07/27/2019    8:42 AM  Advanced Directives  Does Patient Have a Medical Advance Directive? No No No No No No No  Would patient like information on creating a medical advance directive? No - Patient declined No - Patient declined No - Patient declined No - Patient declined  No - Patient declined Yes  (MAU/Ambulatory/Procedural Areas - Information given)    Current Medications (verified) Outpatient Encounter Medications as of 09/30/2023  Medication Sig   atorvastatin  (LIPITOR) 80 MG tablet Take 1 tablet (80 mg total) by mouth daily.   CALCIUM -MAGNESIUM -ZINC PO Take 1 tablet by mouth daily.   Cholecalciferol (VITAMIN D3) 25 MCG (1000 UT) CAPS Take 3 capsules (3,000 Units total) by mouth daily.   omeprazole  (PRILOSEC) 40 MG capsule Take 1 capsule (40 mg total) by mouth daily.   tirzepatide  (ZEPBOUND ) 5 MG/0.5ML Pen Inject 5 mg into the skin once a week.   meloxicam  (MOBIC ) 15 MG tablet Take 15 mg by mouth daily. (Patient not taking: Reported on 09/30/2023)   Multiple Vitamins-Minerals (MULTIVITAMIN ADULT) CHEW Chew by mouth. (Patient not taking: Reported on 09/30/2023)   No facility-administered encounter medications on file as of 09/30/2023.    Allergies (verified) Ciprofloxacin -dexamethasone    History: Past Medical History:  Diagnosis Date   Arthritis    Finger and back   Cataract    Hyperlipidemia    Past Surgical History:  Procedure Laterality Date   APPENDECTOMY  1971   CATARACT EXTRACTION W/PHACO Right 09/08/2017   Procedure: CATARACT EXTRACTION PHACO AND INTRAOCULAR LENS PLACEMENT (IOC)  RIGHT;  Surgeon: Myrna Adine Anes, MD;  Location: [facility];  Service: Ophthalmology;  Laterality: Right;   CATARACT EXTRACTION W/PHACO Left 09/29/2017   Procedure: CATARACT EXTRACTION PHACO AND INTRAOCULAR LENS PLACEMENT (IOC) LEFT;  Surgeon: Myrna Adine Anes, MD;  Location: [facility];  Service: Ophthalmology;  Laterality:  Left;   COLONOSCOPY WITH PROPOFOL  N/A 10/20/2017   Procedure: COLONOSCOPY WITH PROPOFOL ;  Surgeon: Jinny Carmine, MD;  Location: [facility];  Service: Endoscopy;  Laterality: N/A;   Family History  Problem Relation Age of Onset   Kidney failure Mother    Cirrhosis Father    Alcohol  abuse Father    Diabetes Brother    Hypertension Brother     Arthritis Brother    Breast cancer Neg Hx    Social History   Socioeconomic History   Marital status: Married    Spouse name: Elsie   Number of children: 2   Years of education: Not on file   Highest education level: Associate degree: occupational, Scientist, product/process development, or vocational program  Occupational History   Not on file  Tobacco Use   Smoking status: Former    Current packs/day: 0.00    Types: Cigarettes    Quit date: 1973    Years since quitting: 52.6   Smokeless tobacco: Never  Vaping Use   Vaping status: Never Used  Substance and Sexual Activity   Alcohol  use: Yes    Comment: social 1x/yr   Drug use: Never   Sexual activity: Not Currently  Other Topics Concern   Not on file  Social History Narrative   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, political party pbt   Social Drivers of Health   Financial Resource Strain: Low Risk  (09/30/2023)   Overall Financial Resource Strain (CARDIA)    Difficulty of Paying Living Expenses: Not hard at all  Food Insecurity: No Food Insecurity (09/30/2023)   Hunger Vital Sign    Worried About Running Out of Food in the Last Year: Never true    Ran Out of Food in the Last Year: Never true  Transportation Needs: No Transportation Needs (09/30/2023)   PRAPARE - Administrator, Civil Service (Medical): No    Lack of Transportation (Non-Medical): No  Physical Activity: Inactive (09/30/2023)   Exercise Vital Sign    Days of Exercise per Week: 0 days    Minutes of Exercise per Session: 0 min  Stress: No Stress Concern Present (09/30/2023)   [employer] of Occupational Health - Occupational Stress Questionnaire    Feeling of Stress: Only a little  Social Connections: Moderately Isolated (09/30/2023)   Social Connection and Isolation Panel    Frequency of Communication with Friends and Family: More than three times a week    Frequency of Social Gatherings with Friends and Family: More than three times a week    Attends Religious  Services: Never    Database administrator or Organizations: No    Attends Engineer, structural: Never    Marital Status: Married    Tobacco Counseling Counseling given: Not Answered    Clinical Intake:  Pre-visit preparation completed: Yes  Pain : 0-10 Pain Score: 6  Pain Type: Acute pain Pain Location: Shoulder Pain Orientation: Left Pain Descriptors / Indicators: Aching     BMI - recorded: 29.81 Nutritional Status: BMI 25 -29 Overweight Nutritional Risks: None Diabetes: No  Lab Results  Component Value Date   HGBA1C 5.9 07/12/2020   HGBA1C CANCELED 09/18/2018   HGBA1C 5.6 08/21/2017     How often do you need to have someone help you when you read instructions, pamphlets, or other written materials from your doctor or pharmacy?: 1 - Never  Interpreter Needed?: No  Information entered by :: Vina Ned, CMA   Activities of Daily  Living     09/30/2023    8:05 AM  In your present state of health, do you have any difficulty performing the following activities:  Hearing? 0  Vision? 0  Difficulty concentrating or making decisions? 0  Walking or climbing stairs? 0  Dressing or bathing? 0  Doing errands, shopping? 0  Preparing Food and eating ? N  Using the Toilet? N  In the past six months, have you accidently leaked urine? N  Do you have problems with loss of bowel control? N  Managing your Medications? N  Managing your Finances? N  Housekeeping or managing your Housekeeping? N    Patient Care Team: Melvin Pao, NP as PCP - General (Nurse Practitioner) Myrna Adine Anes, MD as Consulting Physician (Ophthalmology) Tobie Lady POUR, MD as Consulting Physician (Rheumatology) Lane Arthea BRAVO, MD as Referring Physician (Neurology)  I have updated your Care Teams any recent Medical Services you may have received from other providers in the past year.     Assessment:   This is a routine wellness examination for Abigail Delacruz.  Hearing/Vision  screen Hearing Screening - Comments:: Denies hearing loss  Vision Screening - Comments:: Gets routine eye exams, Dr. Adine Myrna, [place]   Goals Addressed             This Visit's Progress    Patient Stated       Lose 20 pounds     COMPLETED: Weight (lb) < 200 lb (90.7 kg)   163 lb (73.9 kg)      Depression Screen     09/30/2023    8:12 AM 06/10/2023    8:15 AM 02/11/2023    8:49 AM 12/05/2022    9:49 AM 09/02/2022   11:02 AM 08/12/2022    8:23 AM 05/09/2022   10:24 AM  PHQ 2/9 Scores  PHQ - 2 Score 0 0 0 0 0  0  PHQ- 9 Score 0 0 0 0 0  0  Exception Documentation      Patient refusal     Fall Risk     09/30/2023    8:15 AM 06/10/2023    8:15 AM 12/05/2022    9:48 AM 09/02/2022   11:06 AM 04/11/2022    8:19 AM  Fall Risk   Falls in the past year? 0 0 0 0 0  Number falls in past yr: 0 0 0 0 0  Injury with Fall? 0 0 0 0 0  Risk for fall due to : No Fall Risks No Fall Risks No Fall Risks No Fall Risks No Fall Risks  Follow up Falls evaluation completed Falls evaluation completed Falls evaluation completed Falls prevention discussed;Falls evaluation completed Falls evaluation completed    MEDICARE RISK AT HOME:  Medicare Risk at Home Any stairs in or around the home?: Yes If so, are there any without handrails?: Yes (3 steps without handrail) Home free of loose throw rugs in walkways, pet beds, electrical cords, etc?: Yes Adequate lighting in your home to reduce risk of falls?: Yes Life alert?: No Use of a cane, walker or w/c?: No Grab bars in the bathroom?: No Shower chair or bench in shower?: No Elevated toilet seat or a handicapped toilet?: No  TIMED UP AND GO:  Was the test performed?  No  Cognitive Function: 6CIT completed        09/30/2023    8:16 AM 09/02/2022   11:07 AM 08/30/2021   12:36 PM 08/07/2020    3:26 PM 07/27/2019  8:43 AM  6CIT Screen  What Year? 0 points 0 points 0 points 0 points 0 points  What month? 0 points 0 points 0 points 0  points 0 points  What time? 0 points 0 points 0 points 0 points 0 points  Count back from 20 0 points 0 points 0 points 0 points 0 points  Months in reverse 0 points 0 points 0 points 0 points 0 points  Repeat phrase 0 points 0 points 0 points 0 points 0 points  Total Score 0 points 0 points 0 points 0 points 0 points    Immunizations Immunization History  Administered Date(s) Administered   PFIZER(Purple Top)SARS-COV-2 Vaccination 06/01/2019, 06/22/2019, 01/24/2020   Pneumococcal Conjugate-13 08/26/2017   Pneumococcal Polysaccharide-23 09/18/2018   Zoster Recombinant(Shingrix) 12/26/2022, 05/02/2023    Screening Tests Health Maintenance  Topic Date Due   COVID-19 Vaccine (4 - 2024-25 season) 10/27/2022   INFLUENZA VACCINE  09/26/2023   MAMMOGRAM  01/13/2024   Medicare Annual Wellness (AWV)  09/29/2024   Colonoscopy  10/21/2027   DEXA SCAN  10/31/2027   Pneumococcal Vaccine: 50+ Years  Completed   Hepatitis C Screening  Completed   Zoster Vaccines- Shingrix  Completed   Hepatitis B Vaccines  Aged Out   HPV VACCINES  Aged Out   Meningococcal B Vaccine  Aged Out   DTaP/Tdap/Td  Discontinued    Health Maintenance  Health Maintenance Due  Topic Date Due   COVID-19 Vaccine (4 - 2024-25 season) 10/27/2022   INFLUENZA VACCINE  09/26/2023   Health Maintenance Items Addressed: Mammogram ordered, See Nurse Notes at the end of this note  Additional Screening:  Vision Screening: Recommended annual ophthalmology exams for early detection of glaucoma and other disorders of the eye. Would you like a referral to an eye doctor? No    Dental Screening: Recommended annual dental exams for proper oral hygiene  Community Resource Referral / Chronic Care Management: CRR required this visit?  No   CCM required this visit?  No   Plan:    I have personally reviewed and noted the following in the patient's chart:   Medical and social history Use of alcohol , tobacco or illicit  drugs  Current medications and supplements including opioid prescriptions. Patient is not currently taking opioid prescriptions. Functional ability and status Nutritional status Physical activity Advanced directives List of other physicians Hospitalizations, surgeries, and ER visits in previous 12 months Vitals Screenings to include cognitive, depression, and falls Referrals and appointments  In addition, I have reviewed and discussed with patient certain preventive protocols, quality metrics, and best practice recommendations. A written personalized care plan for preventive services as well as general preventive health recommendations were provided to patient.   Vina Ned, CMA   09/30/2023   After Visit Summary: (MyChart) Due to this being a telephonic visit, the after visit summary with patients personalized plan was offered to patient via MyChart   Notes:  Placed order for a MMG due ~ 01/13/24 Declined flu, covid and Tdap vaccines

## 2023-09-30 NOTE — Patient Instructions (Signed)
 Abigail Delacruz , Thank you for taking time out of your busy schedule to complete your Annual Wellness Visit with me. I enjoyed our conversation and look forward to speaking with you again next year. I, as well as your care team,  appreciate your ongoing commitment to your health goals. Please review the following plan we discussed and let me know if I can assist you in the future. Your Game plan/ To Do List    Referrals: If you haven't heard from the office you've been referred to, please reach out to them at the phone provided.   Follow up Visits: We will see or speak with you next year for your Next Medicare AWV with our clinical staff Have you seen your provider in the last 6 months (3 months if uncontrolled diabetes)? Yes  Clinician Recommendations: Call Dr. Lane (neurologist) to schedule a follow up appointment. Phone # [phone]. Aim for 30 minutes of exercise or brisk walking, 6-8 glasses of water , and 5 servings of fruits and vegetables each day.    Please call to schedule your mammogram (due 01/14/24):  [facility] Address: [address], [place], [place] Phone: [phone]  [facility] [address] [place], [place] [address] Phone: [phone]       This is a list of the screenings recommended for you:  Health Maintenance  Topic Date Due   COVID-19 Vaccine (4 - 2024-25 season) 10/27/2022   Flu Shot  09/26/2023   Mammogram  01/13/2024   Medicare Annual Wellness Visit  09/29/2024   Colon Cancer Screening  10/21/2027   DEXA scan (bone density measurement)  10/31/2027   Pneumococcal Vaccine for age over 29  Completed   Hepatitis C Screening  Completed   Zoster (Shingles) Vaccine  Completed   Hepatitis B Vaccine  Aged Out   HPV Vaccine  Aged Out   Meningitis B Vaccine  Aged Out   DTaP/Tdap/Td vaccine  Discontinued    Advanced directives: (Declined) Advance directive discussed with you  today. Even though you declined this today, please call our office should you change your mind, and we can give you the proper paperwork for you to fill out. Advance Care Planning is important because it:  [x]  Makes sure you receive the medical care that is consistent with your values, goals, and preferences  [x]  It provides guidance to your family and loved ones and reduces their decisional burden about whether or not they are making the right decisions based on your wishes.  Follow the link provided in your after visit summary or read over the paperwork we have mailed to you to help you started getting your Advance Directives in place. If you need assistance in completing these, please reach out to us  so that we can help you!  See attachments for Preventive Care and Fall Prevention Tips.   Fall Prevention in the Home, Adult Falls can cause injuries and affect people of all ages. There are many simple things that you can do to make your home safe and to help prevent falls. If you need it, ask for help making these changes. What actions can I take to prevent falls? General information Use good lighting in all rooms. Make sure to: Replace any light bulbs that burn out. Turn on lights if it is dark and use night-lights. Keep items that you use often in easy-to-reach places. Lower the shelves around your home if needed. Move furniture so  that there are clear paths around it. Do not keep throw rugs or other things on the floor that can make you trip. If any of your floors are uneven, fix them. Add color or contrast paint or tape to clearly mark and help you see: Grab bars or handrails. First and last steps of staircases. Where the edge of each step is. If you use a ladder or stepladder: Make sure that it is fully opened. Do not climb a closed ladder. Make sure the sides of the ladder are locked in place. Have someone hold the ladder while you use it. Know where your pets are as you move  through your home. What can I do in the bathroom?     Keep the floor dry. Clean up any water  that is on the floor right away. Remove soap buildup in the bathtub or shower. Buildup makes bathtubs and showers slippery. Use non-skid mats or decals on the floor of the bathtub or shower. Attach bath mats securely with double-sided, non-slip rug tape. If you need to sit down while you are in the shower, use a non-slip stool. Install grab bars by the toilet and in the bathtub and shower. Do not use towel bars as grab bars. What can I do in the bedroom? Make sure that you have a light by your bed that is easy to reach. Do not use any sheets or blankets on your bed that hang to the floor. Have a firm bench or chair with side arms that you can use for support when you get dressed. What can I do in the kitchen? Clean up any spills right away. If you need to reach something above you, use a sturdy step stool that has a grab bar. Keep electrical cables out of the way. Do not use floor polish or wax that makes floors slippery. What can I do with my stairs? Do not leave anything on the stairs. Make sure that you have a light switch at the top and the bottom of the stairs. Have them installed if you do not have them. Make sure that there are handrails on both sides of the stairs. Fix handrails that are broken or loose. Make sure that handrails are as long as the staircases. Install non-slip stair treads on all stairs in your home if they do not have carpet. Avoid having throw rugs at the top or bottom of stairs, or secure the rugs with carpet tape to prevent them from moving. Choose a carpet design that does not hide the edge of steps on the stairs. Make sure that carpet is firmly attached to the stairs. Fix any carpet that is loose or worn. What can I do on the outside of my home? Use bright outdoor lighting. Repair the edges of walkways and driveways and fix any cracks. Clear paths of anything that  can make you trip, such as tools or rocks. Add color or contrast paint or tape to clearly mark and help you see high doorway thresholds. Trim any bushes or trees on the main path into your home. Check that handrails are securely fastened and in good repair. Both sides of all steps should have handrails. Install guardrails along the edges of any raised decks or porches. Have leaves, snow, and ice cleared regularly. Use sand, salt, or ice melt on walkways during winter months if you live where there is ice and snow. In the garage, clean up any spills right away, including grease or oil spills. What other  actions can I take? Review your medicines with your health care provider. Some medicines can make you confused or feel dizzy. This can increase your chance of falling. Wear closed-toe shoes that fit well and support your feet. Wear shoes that have rubber soles and low heels. Use a cane, walker, scooter, or crutches that help you move around if needed. Talk with your provider about other ways that you can decrease your risk of falls. This may include seeing a physical therapist to learn to do exercises to improve movement and strength. Where to find more information Centers for Disease Control and Prevention, STEADI: TonerPromos.no [employer] on Aging: BaseRingTones.pl National Institute on Aging: BaseRingTones.pl Contact a health care provider if: You are afraid of falling at home. You feel weak, drowsy, or dizzy at home. You fall at home. Get help right away if you: Lose consciousness or have trouble moving after a fall. Have a fall that causes a head injury. These symptoms may be an emergency. Get help right away. Call 911. Do not wait to see if the symptoms will go away. Do not drive yourself to the hospital. This information is not intended to replace advice given to you by your health care provider. Make sure you discuss any questions you have with your health care provider. Document Revised:  10/15/2021 Document Reviewed: 10/15/2021 Elsevier Patient Education  2024 [employer].

## 2023-10-02 DIAGNOSIS — M19042 Primary osteoarthritis, left hand: Secondary | ICD-10-CM | POA: Diagnosis not present

## 2023-10-02 DIAGNOSIS — R202 Paresthesia of skin: Secondary | ICD-10-CM | POA: Diagnosis not present

## 2023-10-02 DIAGNOSIS — R768 Other specified abnormal immunological findings in serum: Secondary | ICD-10-CM | POA: Diagnosis not present

## 2023-10-21 ENCOUNTER — Telehealth: Payer: Self-pay | Admitting: Nurse Practitioner

## 2023-10-21 NOTE — Telephone Encounter (Unsigned)
 Copied from CRM [id number]. Topic: Clinical - Medication Refill >> Oct 21, 2023  3:11 PM Jasmin G wrote: Medication: tirzepatide  (ZEPBOUND ) 5 MG/0.5ML Pen. Pt sates that this time prescription should be upped to 7.5 MG.  Has the patient contacted their pharmacy? No (Agent: If no, request that the patient contact the pharmacy for the refill. If patient does not wish to contact the pharmacy document the reason why and proceed with request.) (Agent: If yes, when and what did the pharmacy advise?)  This is the patient's preferred pharmacy:  [facility] - [place], [place] - [address] [place] [address] Phone: [phone] Fax: [phone]  Is this the correct pharmacy for this prescription? Yes If no, delete pharmacy and type the correct one.   Has the prescription been filled recently? Yes  Is the patient out of the medication? No  Has the patient been seen for an appointment in the last year OR does the patient have an upcoming appointment? Yes  Can we respond through MyChart? No  Agent: Please be advised that Rx refills may take up to 3 business days. We ask that you follow-up with your pharmacy.

## 2023-10-22 NOTE — Telephone Encounter (Signed)
 Forwarding to PCP to review for appropriateness.

## 2023-10-23 MED ORDER — ZEPBOUND 7.5 MG/0.5ML ~~LOC~~ SOAJ: 7.5000 mg | SUBCUTANEOUS | 3 refills | Status: DC

## 2023-10-23 NOTE — Telephone Encounter (Signed)
Called and notified patient that medication was sent in for her.  ?

## 2023-10-23 NOTE — Telephone Encounter (Signed)
 Zepbound  7.5mg  weekly sent into the pharmacy for patient.

## 2023-11-17 ENCOUNTER — Other Ambulatory Visit: Payer: Self-pay | Admitting: Nurse Practitioner

## 2023-11-17 ENCOUNTER — Telehealth: Payer: Self-pay

## 2023-11-17 MED ORDER — TIRZEPATIDE 10 MG/0.5ML ~~LOC~~ SOAJ: 10.0000 mg | SUBCUTANEOUS | 1 refills | Status: DC

## 2023-11-17 MED ORDER — ZEPBOUND 10 MG/0.5ML ~~LOC~~ SOAJ: 10.0000 mg | SUBCUTANEOUS | 2 refills | Status: DC

## 2023-11-17 NOTE — Telephone Encounter (Signed)
 Copied from CRM [id number]. Topic: Clinical - Medication Question >> Nov 17, 2023  4:57 PM Abigail Delacruz wrote: Reason for CRM: The patient's daughter would like to please be contacted by a member of clinical staff to discuss and confirm the increase of the patient's prescription for tirzepatide  (ZEPBOUND ) 10 MG/0.5ML Pen [[id number]].

## 2023-11-17 NOTE — Telephone Encounter (Signed)
 Patient's daughter calling back for patient. Patient is currently out of the country and will be back Thursday 11/20/23. Advised daughter of increase in zepbound  and rx being sent to pharmacy. Daughter verbalized understanding.

## 2023-11-17 NOTE — Telephone Encounter (Signed)
 Called and LVM letting patient know that her increased dose has been sent in for her.

## 2023-11-17 NOTE — Telephone Encounter (Signed)
 Copied from CRM #[id number]. Topic: Clinical - Medication Question >> Nov 17, 2023  9:50 AM Precious C wrote: Reason for CRM: Pt requesting dosage to be upgraded to 10 mg tirzepatide  (ZEPBOUND ) 7.5 MG/0.5ML Pen.

## 2023-11-17 NOTE — Telephone Encounter (Signed)
 Zepbound  10mg  sent to the pharmacy

## 2023-11-17 NOTE — Telephone Encounter (Unsigned)
 Copied from CRM [id number]. Topic: Clinical - Medication Refill >> Nov 17, 2023  9:53 AM Precious C wrote: Medication: tirzepatide  (ZEPBOUND ) 7.5 MG/0.5ML Pen  Has the patient contacted their pharmacy? No, pt requesting dosage upgrade to 10 mg (Agent: If no, request that the patient contact the pharmacy for the refill. If patient does not wish to contact the pharmacy document the reason why and proceed with request.) (Agent: If yes, when and what did the pharmacy advise?)  This is the patient's preferred pharmacy:  [facility] - [place], [place] - [address] [place] [address] Phone: [phone] Fax: [phone]   Is this the correct pharmacy for this prescription? Yes If no, delete pharmacy and type the correct one.   Has the prescription been filled recently? No  Is the patient out of the medication? Yes  Has the patient been seen for an appointment in the last year OR does the patient have an upcoming appointment? Yes  Can we respond through MyChart? Yes  Agent: Please be advised that Rx refills may take up to 3 business days. We ask that you follow-up with your pharmacy.

## 2023-11-18 NOTE — Telephone Encounter (Signed)
 Duplicate request, LRF 11/17/23.  Requested Prescriptions  Pending Prescriptions Disp Refills   tirzepatide  (ZEPBOUND ) 10 MG/0.5ML Pen 4 mL 2    Sig: Inject 10 mg into the skin once a week.     Off-Protocol Failed - 11/18/2023 11:29 AM      Failed - Medication not assigned to a protocol, review manually.      Passed - Valid encounter within last 12 months    Recent Outpatient Visits           3 months ago Hyperlipidemia, unspecified hyperlipidemia type   [facility] Melvin Pao, NP   5 months ago Chronic bilateral thoracic back pain   [facility] Melvin Pao, NP

## 2023-11-21 ENCOUNTER — Other Ambulatory Visit (HOSPITAL_COMMUNITY): Payer: Self-pay

## 2023-12-05 ENCOUNTER — Other Ambulatory Visit: Payer: Self-pay | Admitting: Nurse Practitioner

## 2023-12-09 NOTE — Telephone Encounter (Signed)
 Requested Prescriptions  Pending Prescriptions Disp Refills   omeprazole  (PRILOSEC) 40 MG capsule [Pharmacy Med Name: OMEPRAZOLE  DR 40 MG CAP] 90 capsule 0    Sig: TAKE 1 CAPSULE BY MOUTH ONCE DAILY     Gastroenterology: Proton Pump Inhibitors Passed - 12/09/2023 10:42 AM      Passed - Valid encounter within last 12 months    Recent Outpatient Visits           3 months ago Hyperlipidemia, unspecified hyperlipidemia type   [facility] Melvin Pao, NP   6 months ago Chronic bilateral thoracic back pain   [facility] Melvin Pao, NP

## 2024-01-20 ENCOUNTER — Telehealth: Payer: Self-pay

## 2024-01-20 DIAGNOSIS — E782 Mixed hyperlipidemia: Secondary | ICD-10-CM

## 2024-01-20 MED ORDER — ATORVASTATIN CALCIUM 80 MG PO TABS: 80.0000 mg | ORAL_TABLET | Freq: Every day | ORAL | 0 refills | Status: AC

## 2024-01-20 NOTE — Progress Notes (Signed)
   01/20/2024  Patient ID: Abigail Delacruz, female   DOB: 1951-02-12, 73 y.o.   MRN: [id number]  This patient is appearing on a report for being at risk of failing the adherence measure for cholesterol (statin) medications this calendar year.   Medication: atorvastatin  80mg   Last fill date: 08/28/23 for 100 day supply  Will collaborate with provider to facilitate refill needs.  It appears last prescription was sent in 02/11/23 for a 90 day supply with 1 additional refill.  Patient sees PCP again 12/19 and will be due for labs, so I have pended 1 refill for a 90 day supply.  Abigail Delacruz, PharmD, DPLA

## 2024-01-28 ENCOUNTER — Ambulatory Visit
Admission: RE | Admit: 2024-01-28 | Discharge: 2024-01-28 | Disposition: A | Source: Ambulatory Visit | Attending: Nurse Practitioner

## 2024-01-28 DIAGNOSIS — Z1231 Encounter for screening mammogram for malignant neoplasm of breast: Secondary | ICD-10-CM | POA: Insufficient documentation

## 2024-02-11 ENCOUNTER — Telehealth: Payer: Self-pay | Admitting: Nurse Practitioner

## 2024-02-11 NOTE — Telephone Encounter (Signed)
 Yes the dose can be increased.  She has an appointment in two days, can we discuss it at that appointment?

## 2024-02-11 NOTE — Telephone Encounter (Signed)
 Copied from CRM #[id number]. Topic: Clinical - Medication Question >> Feb 11, 2024  9:43 AM Sophia H wrote: Reason for CRM: Patient is wanting to know if PCP will increase her Zepbound  to 12.5 MG - currently taking tirzepatide  (ZEPBOUND ) 10 MG/0.5ML Pen. She is due for injection this weekend, please advise # [phone]   [facility] - [place], [place] - [address]

## 2024-02-13 ENCOUNTER — Encounter: Payer: Self-pay | Admitting: Nurse Practitioner

## 2024-02-13 ENCOUNTER — Ambulatory Visit: Admitting: Nurse Practitioner

## 2024-02-13 VITALS — BP 100/68 | HR 80 | Temp 98.1°F | Ht 62.6 in | Wt 159.2 lb

## 2024-02-13 DIAGNOSIS — K760 Fatty (change of) liver, not elsewhere classified: Secondary | ICD-10-CM

## 2024-02-13 DIAGNOSIS — R0981 Nasal congestion: Secondary | ICD-10-CM

## 2024-02-13 DIAGNOSIS — E785 Hyperlipidemia, unspecified: Secondary | ICD-10-CM

## 2024-02-13 MED ORDER — METHYLPREDNISOLONE 4 MG PO TBPK: ORAL_TABLET | ORAL | 0 refills | Status: AC

## 2024-02-13 MED ORDER — ZEPBOUND 12.5 MG/0.5ML ~~LOC~~ SOAJ: 12.5000 mg | SUBCUTANEOUS | 2 refills | Status: AC

## 2024-02-13 NOTE — Assessment & Plan Note (Signed)
 Chronic.  Controlled.  Continue with current medication regimen.  Has lost weight and working to keep it off.  Labs ordered today.  Return to clinic in 6 months for reevaluation.  Call sooner if concerns arise.

## 2024-02-13 NOTE — Assessment & Plan Note (Signed)
 Chronic.  Has been on Mounjaro  to help with NAFLD.  Liver enzymes have improved. Will recheck labs at visit today.   Will increase dose to 12.5mg  weekly.

## 2024-02-13 NOTE — Progress Notes (Signed)
 "  BP 100/68 (BP Location: Right Arm, Patient Position: Sitting, Cuff Size: Normal)   Pulse 80   Temp 98.1 F (36.7 C) (Oral)   Ht 5' 2.6 (1.59 m)   Wt 159 lb 3.2 oz (72.2 kg)   SpO2 97%   BMI 28.56 kg/m    Subjective:    Patient ID: Abigail Delacruz, female    DOB: 1950-12-17, 73 y.o.   MRN: [id number]  HPI: Abigail Delacruz is a 73 y.o. female  Chief Complaint  Patient presents with   Annual Exam   chest congestion    Patient stated she has chest congestion for a few months. No other symptoms    HYPERLIPIDEMIA Hyperlipidemia status: excellent compliance Satisfied with current treatment?  yes Side effects:  no Medication compliance: excellent compliance Past cholesterol meds: atorvastain (lipitor) Supplements: none Aspirin:  no The 10-year ASCVD risk score (Arnett DK, et al., 2019) is: 8.9%   Values used to calculate the score:     Age: 12 years     Sex: Female     Is Non-Hispanic African American: No     Diabetic: No     Tobacco smoker: No     Systolic Blood Pressure: 112 mmHg     Is BP treated: No     HDL Cholesterol: 53 mg/dL     Total Cholesterol: 174 mg/dL Chest pain:  no Coronary artery disease:  no Family history CAD:  no Family history early CAD:  no   Denies HA, CP, SOB, dizziness, palpitations, visual changes, and lower extremity swelling.  Patient states she feels like she has a lot of mucous in the morning.  She is coughing up a lot of mucous in the morning.  Feels like she is constantly congested.    Patient still continues to have arthritis but is managing.  She is continues with the Zepbound  to help with NALFD.  She would like to increase   Relevant past medical, surgical, family and social history reviewed and updated as indicated. Interim medical history since our last visit reviewed. Allergies and medications reviewed and updated.  Review of Systems  HENT:  Positive for congestion.   Eyes:  Negative for visual disturbance.  Respiratory:   Negative for cough, chest tightness and shortness of breath.   Cardiovascular:  Negative for chest pain, palpitations and leg swelling.  Musculoskeletal:        Hand pain  Neurological:  Negative for dizziness and headaches.    Per HPI unless specifically indicated above     Objective:    BP 100/68 (BP Location: Right Arm, Patient Position: Sitting, Cuff Size: Normal)   Pulse 80   Temp 98.1 F (36.7 C) (Oral)   Ht 5' 2.6 (1.59 m)   Wt 159 lb 3.2 oz (72.2 kg)   SpO2 97%   BMI 28.56 kg/m   Wt Readings from Last 3 Encounters:  02/13/24 159 lb 3.2 oz (72.2 kg)  09/30/23 163 lb (73.9 kg)  08/12/23 164 lb 12.8 oz (74.8 kg)    Physical Exam Vitals and nursing note reviewed.  Constitutional:      General: She is not in acute distress.    Appearance: Normal appearance. She is normal weight. She is not ill-appearing, toxic-appearing or diaphoretic.  HENT:     Head: Normocephalic.     Right Ear: External ear normal.     Left Ear: External ear normal.     Nose: Nose normal.     Mouth/Throat:  Mouth: Mucous membranes are moist.     Pharynx: Oropharynx is clear.  Eyes:     General:        Right eye: No discharge.        Left eye: No discharge.     Extraocular Movements: Extraocular movements intact.     Conjunctiva/sclera: Conjunctivae normal.     Pupils: Pupils are equal, round, and reactive to light.  Cardiovascular:     Rate and Rhythm: Normal rate and regular rhythm.     Heart sounds: No murmur heard. Pulmonary:     Effort: Pulmonary effort is normal. No respiratory distress.     Breath sounds: Normal breath sounds. No wheezing or rales.  Musculoskeletal:     Cervical back: Normal range of motion and neck supple.  Skin:    General: Skin is warm and dry.     Capillary Refill: Capillary refill takes less than 2 seconds.  Neurological:     General: No focal deficit present.     Mental Status: She is alert and oriented to person, place, and time. Mental status is at  baseline.  Psychiatric:        Mood and Affect: Mood normal.        Behavior: Behavior normal.        Thought Content: Thought content normal.        Judgment: Judgment normal.     Results for orders placed or performed in visit on 08/12/23  Comprehensive metabolic panel with GFR   Collection Time: 08/12/23  9:33 AM  Result Value Ref Range   Glucose 84 70 - 99 mg/dL   BUN 18 8 - 27 mg/dL   Creatinine, Ser 9.36 0.57 - 1.00 mg/dL   eGFR 94 >40 fO/fpw/8.26   BUN/Creatinine Ratio 29 (H) 12 - 28   Sodium 140 134 - 144 mmol/L   Potassium 4.3 3.5 - 5.2 mmol/L   Chloride 102 96 - 106 mmol/L   CO2 23 20 - 29 mmol/L   Calcium  9.6 8.7 - 10.3 mg/dL   Total Protein 6.5 6.0 - 8.5 g/dL   Albumin 4.3 3.8 - 4.8 g/dL   Globulin, Total 2.2 1.5 - 4.5 g/dL   Bilirubin Total 0.6 0.0 - 1.2 mg/dL   Alkaline Phosphatase 125 (H) 44 - 121 IU/L   AST 34 0 - 40 IU/L   ALT 66 (H) 0 - 32 IU/L  Lipid panel   Collection Time: 08/12/23  9:33 AM  Result Value Ref Range   Cholesterol, Total 125 100 - 199 mg/dL   Triglycerides 867 0 - 149 mg/dL   HDL 47 >60 mg/dL   VLDL Cholesterol Cal 23 5 - 40 mg/dL   LDL Chol Calc (NIH) 55 0 - 99 mg/dL   Chol/HDL Ratio 2.7 0.0 - 4.4 ratio  Gamma GT   Collection Time: 08/12/23  9:33 AM  Result Value Ref Range   GGT 20 0 - 60 IU/L  Vitamin D  (25 hydroxy)   Collection Time: 08/12/23  9:33 AM  Result Value Ref Range   Vit D, 25-Hydroxy 28.7 (L) 30.0 - 100.0 ng/mL  PTH, Intact and Calcium    Collection Time: 08/12/23  9:33 AM  Result Value Ref Range   PTH 16 15 - 65 pg/mL   PTH Interp Comment       Assessment & Plan:   Problem List Items Addressed This Visit       Digestive   NAFLD (nonalcoholic fatty liver disease)   Chronic.  Has been on Mounjaro  to help with NAFLD.  Liver enzymes have improved. Will recheck labs at visit today.   Will increase dose to 12.5mg  weekly.       Relevant Orders   Comprehensive metabolic panel with GFR     Other    Hyperlipidemia - Primary   Chronic.  Controlled.  Continue with current medication regimen.  Has lost weight and working to keep it off.  Labs ordered today.  Return to clinic in 6 months for reevaluation.  Call sooner if concerns arise.        Relevant Orders   Lipid panel   Other Visit Diagnoses       Congestion of nasal sinus       Will treat with medrol  dose pak. Complete course of medication. Also recommend Zyrtec  daily for at least 2 weeks.        Follow up plan: Return in about 6 months (around 08/13/2024) for Physical and Fasting labs.      "

## 2024-02-14 LAB — COMPREHENSIVE METABOLIC PANEL WITH GFR
ALT: 38 IU/L — ABNORMAL HIGH (ref 0–32)
AST: 28 IU/L (ref 0–40)
Albumin: 4.1 g/dL (ref 3.8–4.8)
Alkaline Phosphatase: 138 IU/L — ABNORMAL HIGH (ref 49–135)
BUN/Creatinine Ratio: 36 — ABNORMAL HIGH (ref 12–28)
BUN: 22 mg/dL (ref 8–27)
Bilirubin Total: 0.4 mg/dL (ref 0.0–1.2)
CO2: 27 mmol/L (ref 20–29)
Calcium: 8.8 mg/dL (ref 8.7–10.3)
Chloride: 105 mmol/L (ref 96–106)
Creatinine, Ser: 0.61 mg/dL (ref 0.57–1.00)
Globulin, Total: 2.2 g/dL (ref 1.5–4.5)
Glucose: 84 mg/dL (ref 70–99)
Potassium: 4 mmol/L (ref 3.5–5.2)
Sodium: 143 mmol/L (ref 134–144)
Total Protein: 6.3 g/dL (ref 6.0–8.5)
eGFR: 94 mL/min/1.73

## 2024-02-14 LAB — LIPID PANEL
Chol/HDL Ratio: 2 ratio (ref 0.0–4.4)
Cholesterol, Total: 100 mg/dL (ref 100–199)
HDL: 51 mg/dL
LDL Chol Calc (NIH): 35 mg/dL (ref 0–99)
Triglycerides: 66 mg/dL (ref 0–149)
VLDL Cholesterol Cal: 14 mg/dL (ref 5–40)

## 2024-02-16 ENCOUNTER — Ambulatory Visit: Payer: Self-pay | Admitting: Nurse Practitioner

## 2024-02-17 ENCOUNTER — Telehealth: Payer: Self-pay | Admitting: Nurse Practitioner

## 2024-02-17 MED ORDER — ZEPBOUND 10 MG/0.5ML ~~LOC~~ SOAJ: 10.0000 mg | SUBCUTANEOUS | 0 refills | Status: AC

## 2024-02-17 NOTE — Telephone Encounter (Signed)
 10mg  prescription sent to [facility].

## 2024-02-17 NOTE — Addendum Note (Signed)
 Addended by: MELVIN PAO on: 02/17/2024 11:45 AM   Modules accepted: Orders

## 2024-02-17 NOTE — Telephone Encounter (Signed)
Routing to provider for RX.

## 2024-02-17 NOTE — Telephone Encounter (Signed)
 Copied from CRM #[id number]. Topic: Clinical - Prescription Issue >> Feb 17, 2024 11:15 AM Abigail Delacruz wrote: Reason for CRM: Patient called in adv that box of med, tirzepatide  (ZEPBOUND ) 10mg , 2 of the injections were bad so she reached out to [employer] who provided her with a voucher to get another box of that med at the dosage amt of 10mg , patient stated when she took the voucher to her pharmacy to get a box of the med at the dosage amt of 10mg  she was told her provider would need to send in a new prescription for a dosage amt of 10mg , patient is req provider send this prescription to [facility] Address: [address] [place], [place] [address] Phone: [phone], patient stated she is leaving out the country on 02/22/2024 and not returning until the end of January, would like prescription called in ASAP, I did adv patient of 1-3 business day TAT.

## 2024-02-17 NOTE — Telephone Encounter (Signed)
 Called the patient and she has been notified by the pharmacy.

## 2024-10-05 ENCOUNTER — Ambulatory Visit
